# Patient Record
Sex: Male | Born: 1935 | Race: White | Hispanic: No | Marital: Married | Smoking: Never smoker
Health system: Southern US, Community
[De-identification: ages and names within clinical notes are randomized; demographics above are authoritative.]

## PROBLEM LIST (undated history)

## (undated) DIAGNOSIS — I499 Cardiac arrhythmia, unspecified: Secondary | ICD-10-CM

## (undated) DIAGNOSIS — I639 Cerebral infarction, unspecified: Secondary | ICD-10-CM

## (undated) DIAGNOSIS — C4491 Basal cell carcinoma of skin, unspecified: Secondary | ICD-10-CM

## (undated) DIAGNOSIS — N200 Calculus of kidney: Secondary | ICD-10-CM

## (undated) DIAGNOSIS — E785 Hyperlipidemia, unspecified: Secondary | ICD-10-CM

## (undated) DIAGNOSIS — R519 Headache, unspecified: Secondary | ICD-10-CM

## (undated) DIAGNOSIS — N419 Inflammatory disease of prostate, unspecified: Secondary | ICD-10-CM

## (undated) DIAGNOSIS — R51 Headache: Secondary | ICD-10-CM

## (undated) DIAGNOSIS — D649 Anemia, unspecified: Secondary | ICD-10-CM

## (undated) HISTORY — PX: CARDIAC CATHETERIZATION: SHX172

## (undated) HISTORY — PX: APPENDECTOMY: SHX54

## (undated) HISTORY — PX: CATARACT EXTRACTION, BILATERAL: SHX1313

## (undated) HISTORY — PX: TONSILLECTOMY: SUR1361

## (undated) HISTORY — PX: FLEXIBLE SIGMOIDOSCOPY: SHX5431

## (undated) HISTORY — PX: DUPUYTREN CONTRACTURE RELEASE: SHX1478

## (undated) HISTORY — DX: Basal cell carcinoma of skin, unspecified: C44.91

## (undated) HISTORY — PX: COLONOSCOPY: SHX174

---

## 2004-10-09 ENCOUNTER — Encounter: Payer: Self-pay | Admitting: Rheumatology

## 2004-10-25 ENCOUNTER — Encounter: Payer: Self-pay | Admitting: Rheumatology

## 2004-11-25 ENCOUNTER — Encounter: Payer: Self-pay | Admitting: Rheumatology

## 2005-01-22 ENCOUNTER — Ambulatory Visit: Payer: Self-pay | Admitting: Unknown Physician Specialty

## 2005-06-07 ENCOUNTER — Ambulatory Visit: Payer: Self-pay | Admitting: Pain Medicine

## 2005-07-24 ENCOUNTER — Ambulatory Visit: Payer: Self-pay | Admitting: Pain Medicine

## 2005-10-29 ENCOUNTER — Ambulatory Visit: Payer: Self-pay | Admitting: Internal Medicine

## 2005-12-10 ENCOUNTER — Ambulatory Visit: Payer: Self-pay | Admitting: Specialist

## 2005-12-12 ENCOUNTER — Ambulatory Visit: Payer: Self-pay | Admitting: Orthopedic Surgery

## 2006-02-06 ENCOUNTER — Ambulatory Visit: Payer: Self-pay | Admitting: Internal Medicine

## 2006-03-21 ENCOUNTER — Ambulatory Visit: Payer: Self-pay | Admitting: Internal Medicine

## 2007-06-04 ENCOUNTER — Ambulatory Visit: Payer: Self-pay | Admitting: Gastroenterology

## 2007-11-10 ENCOUNTER — Ambulatory Visit: Payer: Self-pay | Admitting: Cardiovascular Disease

## 2007-11-14 ENCOUNTER — Emergency Department: Payer: Self-pay | Admitting: Unknown Physician Specialty

## 2007-11-14 ENCOUNTER — Other Ambulatory Visit: Payer: Self-pay

## 2008-09-08 ENCOUNTER — Ambulatory Visit: Payer: Self-pay | Admitting: Specialist

## 2009-10-27 ENCOUNTER — Ambulatory Visit: Payer: Self-pay | Admitting: Internal Medicine

## 2010-02-16 ENCOUNTER — Ambulatory Visit: Payer: Self-pay | Admitting: Urology

## 2010-03-13 ENCOUNTER — Ambulatory Visit: Payer: Self-pay | Admitting: Urology

## 2011-12-08 ENCOUNTER — Ambulatory Visit: Payer: Self-pay | Admitting: Specialist

## 2011-12-20 ENCOUNTER — Ambulatory Visit: Payer: Self-pay | Admitting: Specialist

## 2014-02-05 DIAGNOSIS — I2510 Atherosclerotic heart disease of native coronary artery without angina pectoris: Secondary | ICD-10-CM | POA: Insufficient documentation

## 2014-09-06 DIAGNOSIS — E782 Mixed hyperlipidemia: Secondary | ICD-10-CM | POA: Diagnosis not present

## 2014-09-06 DIAGNOSIS — I340 Nonrheumatic mitral (valve) insufficiency: Secondary | ICD-10-CM | POA: Insufficient documentation

## 2014-09-06 DIAGNOSIS — I480 Paroxysmal atrial fibrillation: Secondary | ICD-10-CM | POA: Diagnosis not present

## 2014-09-06 DIAGNOSIS — I2510 Atherosclerotic heart disease of native coronary artery without angina pectoris: Secondary | ICD-10-CM | POA: Diagnosis not present

## 2014-09-06 DIAGNOSIS — I10 Essential (primary) hypertension: Secondary | ICD-10-CM | POA: Diagnosis not present

## 2014-09-30 DIAGNOSIS — I4891 Unspecified atrial fibrillation: Secondary | ICD-10-CM | POA: Diagnosis not present

## 2014-10-04 DIAGNOSIS — H35351 Cystoid macular degeneration, right eye: Secondary | ICD-10-CM | POA: Diagnosis not present

## 2014-11-04 DIAGNOSIS — Z125 Encounter for screening for malignant neoplasm of prostate: Secondary | ICD-10-CM | POA: Diagnosis not present

## 2014-11-04 DIAGNOSIS — I4891 Unspecified atrial fibrillation: Secondary | ICD-10-CM | POA: Diagnosis not present

## 2014-11-04 DIAGNOSIS — I38 Endocarditis, valve unspecified: Secondary | ICD-10-CM | POA: Diagnosis not present

## 2014-11-04 DIAGNOSIS — I2510 Atherosclerotic heart disease of native coronary artery without angina pectoris: Secondary | ICD-10-CM | POA: Diagnosis not present

## 2014-11-04 DIAGNOSIS — I10 Essential (primary) hypertension: Secondary | ICD-10-CM | POA: Diagnosis not present

## 2014-11-11 DIAGNOSIS — E780 Pure hypercholesterolemia: Secondary | ICD-10-CM | POA: Diagnosis not present

## 2014-11-11 DIAGNOSIS — I10 Essential (primary) hypertension: Secondary | ICD-10-CM | POA: Diagnosis not present

## 2014-11-11 DIAGNOSIS — I480 Paroxysmal atrial fibrillation: Secondary | ICD-10-CM | POA: Diagnosis not present

## 2014-11-11 DIAGNOSIS — G43009 Migraine without aura, not intractable, without status migrainosus: Secondary | ICD-10-CM | POA: Diagnosis not present

## 2014-11-25 DIAGNOSIS — I4891 Unspecified atrial fibrillation: Secondary | ICD-10-CM | POA: Diagnosis not present

## 2014-12-02 DIAGNOSIS — R351 Nocturia: Secondary | ICD-10-CM | POA: Diagnosis not present

## 2014-12-19 NOTE — Op Note (Signed)
PATIENT NAME:  Ryan Pace, Ryan Pace MR#:  [id number] DATE OF BIRTH:  01-02-1929  DATE OF PROCEDURE:  12/20/2011  PREOPERATIVE DIAGNOSIS: Possible rupture extensor tendons left ring and small fingers.   POSTOPERATIVE DIAGNOSIS: Ulnar dislocation left ring and small finger extensor tendons at the MP joint.   PROCEDURE: 1. Re-centralization left ring and small finger extensor tendons at the MP joint. 2. Exploration of the extensor tendons left wrist. 3. Synovectomy left ring and small finger MP joints.  SURGEON: Park Breed, M.D.   ANESTHESIA: General LMA.   COMPLICATIONS: None.   DRAINS: None.   ESTIMATED BLOOD LOSS: None.   REPLACED: None.   DESCRIPTION OF PROCEDURE: The patient was brought to the operating room where he underwent satisfactory general LMA anesthesia in the supine position. The left arm was prepped and draped in sterile fashion. Esmarch was applied and the tourniquet inflated to 250 mmHg. Tourniquet time was 76 minutes. A longitudinal incision was made over the dorsum of the left wrist and dissection carried out bluntly through subcutaneous tissue. Electrocautery was used to cauterize bridging veins. The extensor tendons were exposed proximal and distal to the dorsal retinaculum. The tendons appeared to be intact with no significant fraying or synovitis there. By applying tension to the tendons over the dorsum of the hand, the tendons were seen to be intact out to the MP region. However, on trying to extend the fingers by pulling on the tendons, the fingers flexed instead. The incision was then made transversely over the dorsum of the ring finger. Dissection was carried out bluntly through subcutaneous tissue. The extensor tendon was found to be completely dislocated ulnarly from its normal dorsal position, and was actually down below the site of rotation. The incision was extended over to the small finger as well and this extensor tendon was also found to be completely  subluxed. Careful dissection was then carried out on both the extensor tendons. A complete release was done ulnarly and the tendons were elevated and brought up dorsally. Lateral bands were cut distally out onto the proximal phalanx as well since the tendons were still subluxed there. The metacarpal heads were examined. They were completely flattened with no groove whatsoever. I used a small burr to create a small groove in both bones, irrigated them thoroughly, and covered them with bone wax. The tendons were then stabilized with 3-0 Mersilene sutures by imbricating the redundant synovium on the radial side of both fingers with multiple sutures. The digiti quinti tendon and the almost ulnar slip of the extensor to the ring finger were divided and they were brought over the top and sutured into the radial side of the extensor hood on both fingers. The tendons were now well centralized. Through flexion and extension they remained in this position. There was no tendency to sublux radially.  After irrigation the wounds were closed with running 5-0 nylon sutures. 0.5% Marcaine was placed in the wounds. Dry sterile compression hand dressing was applied with a volar splint to keep the wrist and fingers in extension. Tourniquet was deflated with good return of blood flow to the hand. The patient was awakened and taken to recovery in good condition.    ____________________________ Park Breed, MD hem:bjt D: 12/20/2011 12:16:08 ET T: 12/20/2011 13:10:57 ET JOB#: [id number]  cc: Park Breed, MD, <Dictator> Park Breed MD ELECTRONICALLY SIGNED 12/22/2011 15:05

## 2014-12-21 DIAGNOSIS — I4891 Unspecified atrial fibrillation: Secondary | ICD-10-CM | POA: Diagnosis not present

## 2015-01-11 DIAGNOSIS — R351 Nocturia: Secondary | ICD-10-CM | POA: Diagnosis not present

## 2015-01-13 DIAGNOSIS — M2041 Other hammer toe(s) (acquired), right foot: Secondary | ICD-10-CM | POA: Diagnosis not present

## 2015-01-13 DIAGNOSIS — M79674 Pain in right toe(s): Secondary | ICD-10-CM | POA: Diagnosis not present

## 2015-01-13 DIAGNOSIS — M79675 Pain in left toe(s): Secondary | ICD-10-CM | POA: Diagnosis not present

## 2015-01-13 DIAGNOSIS — B351 Tinea unguium: Secondary | ICD-10-CM | POA: Diagnosis not present

## 2015-01-19 DIAGNOSIS — R350 Frequency of micturition: Secondary | ICD-10-CM | POA: Diagnosis not present

## 2015-01-19 DIAGNOSIS — N401 Enlarged prostate with lower urinary tract symptoms: Secondary | ICD-10-CM | POA: Diagnosis not present

## 2015-01-19 DIAGNOSIS — R351 Nocturia: Secondary | ICD-10-CM | POA: Diagnosis not present

## 2015-01-31 DIAGNOSIS — N401 Enlarged prostate with lower urinary tract symptoms: Secondary | ICD-10-CM | POA: Diagnosis not present

## 2015-01-31 DIAGNOSIS — R351 Nocturia: Secondary | ICD-10-CM | POA: Diagnosis not present

## 2015-01-31 DIAGNOSIS — R350 Frequency of micturition: Secondary | ICD-10-CM | POA: Diagnosis not present

## 2015-02-07 DIAGNOSIS — N401 Enlarged prostate with lower urinary tract symptoms: Secondary | ICD-10-CM | POA: Diagnosis not present

## 2015-02-07 DIAGNOSIS — R351 Nocturia: Secondary | ICD-10-CM | POA: Diagnosis not present

## 2015-02-08 ENCOUNTER — Encounter
Admission: RE | Admit: 2015-02-08 | Discharge: 2015-02-08 | Disposition: A | Discharge status: Home / Self Care | Payer: Commercial Managed Care - HMO | Source: Ambulatory Visit | Attending: Urology | Admitting: Urology

## 2015-02-08 DIAGNOSIS — N400 Enlarged prostate without lower urinary tract symptoms: Secondary | ICD-10-CM | POA: Insufficient documentation

## 2015-02-08 DIAGNOSIS — Z01812 Encounter for preprocedural laboratory examination: Secondary | ICD-10-CM | POA: Diagnosis not present

## 2015-02-08 HISTORY — DX: Cardiac arrhythmia, unspecified: I49.9

## 2015-02-08 LAB — CBC
HEMATOCRIT: 45.3 % (ref 40.0–52.0)
Hemoglobin: 14.8 g/dL (ref 13.0–18.0)
MCH: 29.9 pg (ref 26.0–34.0)
MCHC: 32.7 g/dL (ref 32.0–36.0)
MCV: 91.5 fL (ref 80.0–100.0)
Platelets: 141 10*3/uL — ABNORMAL LOW (ref 150–440)
RBC: 4.96 MIL/uL (ref 4.40–5.90)
RDW: 13.7 % (ref 11.5–14.5)
WBC: 4.9 10*3/uL (ref 3.8–10.6)

## 2015-02-08 LAB — BASIC METABOLIC PANEL
Anion gap: 7 (ref 5–15)
BUN: 34 mg/dL — ABNORMAL HIGH (ref 6–20)
CO2: 30 mmol/L (ref 22–32)
Calcium: 9.3 mg/dL (ref 8.9–10.3)
Chloride: 105 mmol/L (ref 101–111)
Creatinine, Ser: 1.29 mg/dL — ABNORMAL HIGH (ref 0.61–1.24)
GFR calc Af Amer: 57 mL/min — ABNORMAL LOW (ref >60)
GFR, EST NON AFRICAN AMERICAN: 50 mL/min — AB (ref >60)
GLUCOSE: 87 mg/dL (ref 65–99)
POTASSIUM: 4.2 mmol/L (ref 3.5–5.1)
SODIUM: 142 mmol/L (ref 135–145)

## 2015-02-08 NOTE — H&P (Signed)
NAME:  MAGGIE, DWORKIN NO.:  [id number]  MEDICAL RECORD NO.:  [id number]  LOCATION:                               FACILITY:  [facility]  PHYSICIAN:  Maryan Puls          DATE OF BIRTH:  1929/08/04  DATE OF ADMISSION:  02/15/2015 DATE OF DISCHARGE:                            HISTORY AND PHYSICAL   Same-day surgery scheduled June 21st.  CHIEF COMPLAINT:  Difficulty voiding.  HISTORY OF PRESENT ILLNESS:  Mr. Helt is an 81 year old white male with a one-year history of significant lower urinary tract symptoms, which include frequency, urgency, and nocturia.  Evaluation in the office included a uroflow May 17th, which indicated a maximum flow rate of 4 cc per second, and an average flow rate of 2 cc per second, and a postvoid residual of 48 cc.  Cystoscopy on June 6th indicated an obstructing median lobe.  Patient comes in now for photovaporization of the prostate with the GreenLight laser.  ALLERGIES:  PATIENT IS ALLERGIC TO PENICILLIN.  CURRENT MEDICATIONS:  Include Coumadin, verapamil, and pravastatin.  PAST SURGICAL HISTORY:  Prior surgical procedures included: 1. Appendectomy in 1948. 2. Bilateral Dupuytren contracture of the hands in 2011. 3. Cardiac catheterization 2014, which did not reveal any significant     blockages. 4. Photovaporization of prostate 2011.  SOCIAL HISTORY:  The patient denied tobacco or alcohol use.  FAMILY HISTORY:  Positive for colon cancer.  PAST AND CURRENT MEDICAL CONDITIONS: 1. Atrial fibrillation since 1995. 2. Hypercholesterolemia.  REVIEW OF SYSTEMS:  Patient denied chest pain, shortness of breath, diabetes, hypertension, or stroke.  PHYSICAL EXAMINATION:  GENERAL:  Well-nourished white male in no acute distress. HEENT:  Sclerae were clear.  Pupils were equally round and reactive to light and accommodation.  Extraocular movements were intact. NECK:  Supple.  No palpable cervical adenopathy.  No audible  carotid bruits. LUNGS:  Clear to auscultation. CARDIOVASCULAR:  Regular rhythm and rate without audible murmurs. ABDOMEN:  Soft, nontender abdomen. GU:  Uncircumcised.  Testes smooth, nontender. RECTAL:  35 g, smooth, nontender prostate. NEUROMUSCULAR:  Alert and oriented x3.  IMPRESSION:  Benign prostatic hyperplasia with bladder outlet obstruction.  PLAN:  Photovaporization of prostate with the GreenLight Laser Therapy laser.          ______________________________ Maryan Puls     MW/MEDQ  D:  02/07/2015  T:  02/07/2015  Job:  [id number]

## 2015-02-08 NOTE — Patient Instructions (Signed)
  Your procedure is scheduled on: Tuesday 6/21 Report to Day Surgery. [facility] Entrance To find out your arrival time please call [phone] between 1PM - 3PM on Monday 6/20.  Remember: Instructions that are not followed completely may result in serious medical risk, up to and including death, or upon the discretion of your surgeon and anesthesiologist your surgery may need to be rescheduled.    _x___ 1. Do not eat food or drink liquids after midnight. No gum chewing or hard candies.     __x__ 2. No Alcohol for 24 hours before or after surgery.   ____ 3. Bring all medications with you on the day of surgery if instructed.    __x__ 4. Notify your doctor if there is any change in your medical condition     (cold, fever, infections).     Do not wear jewelry, make-up, hairpins, clips or nail polish.  Do not wear lotions, powders, or perfumes. You may wear deodorant.  Do not shave 48 hours prior to surgery. Men may shave face and neck.  Do not bring valuables to the hospital.    [facility] is not responsible for any belongings or valuables.               Contacts, dentures or bridgework may not be worn into surgery.  Leave your suitcase in the car. After surgery it may be brought to your room.  For patients admitted to the hospital, discharge time is determined by your                treatment team.   Patients discharged the day of surgery will not be allowed to drive home.   Please read over the following fact sheets that you were given:   Surgical Site Infection Prevention   __x__ Take these medicines the morning of surgery with A SIP OF WATER:    1. verapamil  2.   3.   4.  5.  6.  ____ Fleet Enema (as directed)   ____ Use CHG Soap as directed  ____ Use inhalers on the day of surgery  ____ Stop metformin 2 days prior to surgery    ____ Take 1/2 of usual insulin dose the night before surgery and none on the morning of surgery.   __x__ Stop Coumadin/Plavix/aspirin  on  7 days prior to surgery as directed by Dr. Yves Dill  ____ Stop Anti-inflammatories on    ____ Stop supplements until after surgery.    ____ Bring C-Pap to the hospital.

## 2015-02-15 ENCOUNTER — Ambulatory Visit
Admission: RE | Admit: 2015-02-15 | Discharge: 2015-02-15 | Disposition: A | Discharge status: Home / Self Care | Payer: Commercial Managed Care - HMO | Source: Ambulatory Visit | Attending: Urology | Admitting: Urology

## 2015-02-15 ENCOUNTER — Ambulatory Visit: Payer: Commercial Managed Care - HMO | Admitting: Anesthesiology

## 2015-02-15 ENCOUNTER — Encounter: Payer: Self-pay | Admitting: Anesthesiology

## 2015-02-15 ENCOUNTER — Encounter
Admission: RE | Disposition: A | Discharge status: Home / Self Care | Payer: Self-pay | Source: Ambulatory Visit | Attending: Urology

## 2015-02-15 DIAGNOSIS — Z9889 Other specified postprocedural states: Secondary | ICD-10-CM | POA: Insufficient documentation

## 2015-02-15 DIAGNOSIS — I4891 Unspecified atrial fibrillation: Secondary | ICD-10-CM | POA: Diagnosis not present

## 2015-02-15 DIAGNOSIS — Z880 Allergy status to penicillin: Secondary | ICD-10-CM | POA: Diagnosis not present

## 2015-02-15 DIAGNOSIS — N401 Enlarged prostate with lower urinary tract symptoms: Secondary | ICD-10-CM | POA: Insufficient documentation

## 2015-02-15 DIAGNOSIS — N138 Other obstructive and reflux uropathy: Secondary | ICD-10-CM | POA: Insufficient documentation

## 2015-02-15 DIAGNOSIS — N400 Enlarged prostate without lower urinary tract symptoms: Secondary | ICD-10-CM | POA: Diagnosis not present

## 2015-02-15 DIAGNOSIS — E780 Pure hypercholesterolemia: Secondary | ICD-10-CM | POA: Diagnosis not present

## 2015-02-15 HISTORY — PX: GREEN LIGHT LASER TURP (TRANSURETHRAL RESECTION OF PROSTATE: SHX6260

## 2015-02-15 LAB — PROTIME-INR
INR: 1.08
PROTHROMBIN TIME: 14.2 s (ref 11.4–15.0)

## 2015-02-15 SURGERY — GREEN LIGHT LASER TURP (TRANSURETHRAL RESECTION OF PROSTATE
Anesthesia: General | Wound class: Clean Contaminated

## 2015-02-15 MED ORDER — LEVOFLOXACIN IN D5W 500 MG/100ML IV SOLN
INTRAVENOUS | Status: AC
  Administered 2015-02-15: 500 mg via INTRAVENOUS
  Filled 2015-02-15: qty 100

## 2015-02-15 MED ORDER — DOCUSATE SODIUM 100 MG PO CAPS: 200.0000 mg | ORAL_CAPSULE | Freq: Two times a day (BID) | ORAL | Status: DC

## 2015-02-15 MED ORDER — LACTATED RINGERS IV SOLN
INTRAVENOUS | Status: DC
  Administered 2015-02-15: 14:00:00 via INTRAVENOUS

## 2015-02-15 MED ORDER — BELLADONNA ALKALOIDS-OPIUM 16.2-60 MG RE SUPP
RECTAL | Status: AC
  Filled 2015-02-15: qty 1

## 2015-02-15 MED ORDER — DEXAMETHASONE SODIUM PHOSPHATE 4 MG/ML IJ SOLN
INTRAMUSCULAR | Status: DC | PRN
  Administered 2015-02-15: 5 mg via INTRAVENOUS

## 2015-02-15 MED ORDER — BELLADONNA ALKALOIDS-OPIUM 16.2-60 MG RE SUPP
RECTAL | Status: DC | PRN
  Administered 2015-02-15: 1 via RECTAL

## 2015-02-15 MED ORDER — FENTANYL CITRATE (PF) 100 MCG/2ML IJ SOLN
INTRAMUSCULAR | Status: AC
  Filled 2015-02-15: qty 2

## 2015-02-15 MED ORDER — PROPOFOL 10 MG/ML IV BOLUS
INTRAVENOUS | Status: DC | PRN
  Administered 2015-02-15: 130 mg via INTRAVENOUS

## 2015-02-15 MED ORDER — NUCYNTA 50 MG PO TABS: 50.0000 mg | ORAL_TABLET | Freq: Four times a day (QID) | ORAL | Status: DC | PRN

## 2015-02-15 MED ORDER — ONDANSETRON HCL 4 MG/2ML IJ SOLN
INTRAMUSCULAR | Status: DC | PRN
  Administered 2015-02-15: 4 mg via INTRAVENOUS

## 2015-02-15 MED ORDER — UROGESIC-BLUE 81.6 MG PO TABS: 1.0000 | ORAL_TABLET | Freq: Four times a day (QID) | ORAL | Status: DC | PRN

## 2015-02-15 MED ORDER — LIDOCAINE HCL 2 % EX GEL
CUTANEOUS | Status: AC
  Filled 2015-02-15: qty 10

## 2015-02-15 MED ORDER — FAMOTIDINE 20 MG PO TABS
ORAL_TABLET | ORAL | Status: AC
  Administered 2015-02-15: 20 mg via ORAL
  Filled 2015-02-15: qty 1

## 2015-02-15 MED ORDER — ONDANSETRON HCL 4 MG/2ML IJ SOLN: 4.0000 mg | Freq: Once | INTRAMUSCULAR | Status: DC | PRN

## 2015-02-15 MED ORDER — FENTANYL CITRATE (PF) 100 MCG/2ML IJ SOLN
INTRAMUSCULAR | Status: DC | PRN
  Administered 2015-02-15 (×2): 50 ug via INTRAVENOUS

## 2015-02-15 MED ORDER — LIDOCAINE HCL 2 % EX GEL
CUTANEOUS | Status: AC
  Filled 2015-02-15: qty 5

## 2015-02-15 MED ORDER — SODIUM CHLORIDE 0.9 % IR SOLN
Status: DC | PRN
  Administered 2015-02-15: 4600 mL

## 2015-02-15 MED ORDER — LEVOFLOXACIN IN D5W 500 MG/100ML IV SOLN
500.0000 mg | INTRAVENOUS | Status: DC
  Administered 2015-02-15: 500 mg via INTRAVENOUS

## 2015-02-15 MED ORDER — FAMOTIDINE 20 MG PO TABS
20.0000 mg | ORAL_TABLET | Freq: Once | ORAL | Status: AC
  Administered 2015-02-15: 20 mg via ORAL

## 2015-02-15 MED ORDER — FENTANYL CITRATE (PF) 100 MCG/2ML IJ SOLN
25.0000 ug | INTRAMUSCULAR | Status: DC | PRN
  Administered 2015-02-15 (×3): 25 ug via INTRAVENOUS

## 2015-02-15 MED ORDER — LEVOFLOXACIN 500 MG PO TABS: 500.0000 mg | ORAL_TABLET | Freq: Every day | ORAL | Status: DC

## 2015-02-15 MED ORDER — LIDOCAINE HCL 2 % EX GEL
CUTANEOUS | Status: DC | PRN
  Administered 2015-02-15: 1

## 2015-02-15 MED ORDER — LIDOCAINE HCL (CARDIAC) 20 MG/ML IV SOLN
INTRAVENOUS | Status: DC | PRN
Start: 2015-02-15 — End: 2015-02-15
  Administered 2015-02-15: 80 mg via INTRAVENOUS

## 2015-02-15 SURGICAL SUPPLY — 26 items
ADAPTER IRRIG TUBE 2 SPIKE SOL (ADAPTER) ×4 IMPLANT
BAG URO DRAIN 2000ML W/SPOUT (MISCELLANEOUS) ×2 IMPLANT
CATH FOLEY 2WAY  5CC 20FR SIL (CATHETERS) ×1
CATH FOLEY 2WAY 5CC 20FR SIL (CATHETERS) ×1 IMPLANT
GLOVE BIO SURGEON STRL SZ7 (GLOVE) ×4 IMPLANT
GLOVE BIO SURGEON STRL SZ7.5 (GLOVE) ×2 IMPLANT
GOWN STRL REUS W/ TWL LRG LVL3 (GOWN DISPOSABLE) ×3 IMPLANT
GOWN STRL REUS W/ TWL XL LVL3 (GOWN DISPOSABLE) ×1 IMPLANT
GOWN STRL REUS W/TWL LRG LVL3 (GOWN DISPOSABLE) ×3
GOWN STRL REUS W/TWL XL LVL3 (GOWN DISPOSABLE) ×1
IV NS 1000ML (IV SOLUTION) ×4
IV NS 1000ML BAXH (IV SOLUTION) ×4 IMPLANT
IV SET PRIMARY 15D 139IN B9900 (IV SETS) ×2 IMPLANT
JELLY LUB 2OZ STRL (MISCELLANEOUS) ×1
JELLY LUBE 2OZ STRL (MISCELLANEOUS) ×1 IMPLANT
KIT RM TURNOVER CYSTO AR (KITS) ×2 IMPLANT
LASER GRNLGT 950 (MISCELLANEOUS) ×2 IMPLANT
LASER GRNLGT MOXY FIBER 750UM (MISCELLANEOUS) ×2 IMPLANT
PACK CYSTO AR (MISCELLANEOUS) ×2 IMPLANT
PREP PVP WINGED SPONGE (MISCELLANEOUS) ×2 IMPLANT
SET IRRIG Y TYPE TUR BLADDER L (SET/KITS/TRAYS/PACK) ×2 IMPLANT
SOL PREP PVP 2OZ (MISCELLANEOUS) ×2
SOLUTION PREP PVP 2OZ (MISCELLANEOUS) ×1 IMPLANT
SYRINGE IRR TOOMEY STRL 70CC (SYRINGE) ×2 IMPLANT
TUBING CONNECTING 10 (TUBING) ×2 IMPLANT
WATER STERILE IRR 1000ML POUR (IV SOLUTION) ×2 IMPLANT

## 2015-02-15 NOTE — Op Note (Addendum)
Preoperative diagnosis:  1. Bladder outlet obstruction secondary to enlarged prostate (BPH)  Postoperative diagnosis:  1. As above   Procedure:  Photovaporization of the prostate with greenlight laser  Surgeon: Otelia Limes. Yves Dill MD, FACS   Anesthesia: General   Complications: None  Intraoperative findings: Trilobar BPH  Joules:  80   EBL: Minimal  Specimens: None  Indication: Ryan Pace is a 81 year old patient with BPH and lower urinary tract symptoms..  After reviewing the management options for treatment, he elected to proceed with the above surgical procedure(s). We have discussed the potential benefits and risks of the procedure, side effects of the proposed treatment, the likelihood of the patient achieving the goals of the procedure, and any potential problems that might occur during the procedure or recuperation. Informed consent has been obtained.  Description of procedure: The patient was taken to the operating room and general anesthesia was induced.  The patient was placed in the dorsal lithotomy position, prepped and draped in the usual sterile fashion, and preoperative antibiotics were administered. A preoperative time-out was performed.   The patient was taken to the operating room and general anesthesia was induced. The patient was placed in the dorsal lithotomy position, prepped and draped in the usual sterile fashion, and preoperative antibiotics were administered. A preoperative time-out was performed.   The laser scope was coupled to the camera and visually advanced into the bladder. Bladder was thoroughly inspected and no lesions or tumors identified. Both ureteral orifices were identified and had clear efflux. The patient had trlobar BPH. The XPS greenlight laser fiber was passed through the scope. The power was set at 80 watts and the median lobe and bladder neck tissue was vaporized. Remaining obstructive tissue from the bladder neck to the verumontanum was  vaporized at 80 watts.  Once an adequate channel had been created, all  bleeders were cauterized. Urojet lidocaine was injected into the urethra and bladder. A 56C silicone Foley catheter was placed into the patient's urethra and the bladder irrigated until the effluent was clear. A B&O suppository was placed. The patient was subsequently extubated and returned to the PACU in excellent condition. There no immediate complications.  Disposition:  The patient will be discharged home today with Foley catheter placed, and return in 1-2 days for a voiding trial.

## 2015-02-15 NOTE — Discharge Instructions (Signed)
Benign Prostatic Hyperplasia An enlarged prostate (benign prostatic hyperplasia) is common in older men. You may experience the following:  Weak urine stream.  Dribbling.  Feeling like the bladder has not emptied completely.  Difficulty starting urination.  Getting up frequently at night to urinate.  Urinating more frequently during the day. HOME CARE INSTRUCTIONS  Monitor your prostatic hyperplasia for any changes. The following actions may help to alleviate any discomfort you are experiencing:  Give yourself time when you urinate.  Stay away from alcohol.  Avoid beverages containing caffeine, such as coffee, tea, and colas, because they can make the problem worse.  Avoid decongestants, antihistamines, and some prescription medicines that can make the problem worse.  Follow up with your health care provider for further treatment as recommended. SEEK MEDICAL CARE IF:  You are experiencing progressive difficulty voiding.  Your urine stream is progressively getting narrower.  You are awaking from sleep with the urge to void more frequently.  You are constantly feeling the need to void.  You experience loss of urine, especially in small amounts. SEEK IMMEDIATE MEDICAL CARE IF:   You develop increased pain with urination or are unable to urinate.  You develop severe abdominal pain, vomiting, a high fever, or fainting.  You develop back pain or blood in your urine. MAKE SURE YOU:   Understand these instructions.  Will watch your condition.  Will get help right away if you are not doing well or get worse. Document Released: 08/13/2005 Document Revised: 04/15/2013 Document Reviewed: 01/13/2013 [facility] Patient Information 2015 [place], [place]. This information is not intended to replace advice given to you by your health care provider. Make sure you discuss any questions you have with your health care provider.  Julianne Rice Laser Prostate Treatment Green light laser  therapy is a procedure that uses a special high-energy laser for vaporizing extra prostate tissue. It is less invasive than traditional methods of prostate surgery, which involve cutting out the prostate tissue. Because the tissue is vaporized rather than cut out there is generally less blood loss. LET [facility] CARE PROVIDER KNOW ABOUT:  Any allergies you have.  Any medicines you are taking, including vitamins, herbs, eye drops, creams, and over-the-counter medication.  Previous problems you or members of your family have had with the use of anesthetics.  Any blood disorders you have.  Previous surgeries you have had.  Medical conditions you have. RISKS AND COMPLICATIONS Generally, green light laser prostate treatment is a safe procedure. However, as with any procedure, complications can occur. Possible complications include:  Urinary tract infection.  Erectile dysfunction (rare).  Dry ejaculation--Semen is not released when you reach sexual climax.  Scar tissue in the urinary passage. BEFORE THE PROCEDURE   Your health care provider may discuss medicines you are taking and may advise you to stop taking specific ones.  You may be given antibiotic medicine to take as a precaution against bacterial infection.  Do not eat or drink anything for 8 hours before your procedure or as directed by your health care provider. You may have a sip of water to take any necessary medicines. PROCEDURE Depending on the size and shape of your prostate, the procedure may take 30-60 minutes. You will be given one of the following:   A medicine that makes you go to sleep (general anesthetic).  A medicine injected into your spine that numbs your body below the waist (spinal anesthetic). Sedation is usually given with spinal anesthetic so you will be relaxed. A  tube containing viewing scopes and instruments will be inserted through your penis so that no cuts (incisions) are needed. A thin fiber is put  through the tube and positioned next to the excess prostate tissue. Pulses of laser light come from the end of the fiber and are projected onto the excess tissue. The laser beam is absorbed by your blood, which becomes hot enough to vaporize the excess prostate tissue. This laser beam will seal off the blood vessels, decreasing bleeding. The tube with the viewing scopes, instruments, and thin fiber will be removed and replaced with a temporary catheter. AFTER THE PROCEDURE  After the surgery, you will be sent to the recovery room for a short time. Depending on factors such as the amount of prostate tissue vaporized, the strength of your bladder, and the amount of bleeding expected, the catheter may be removed. Generally, overnight stay is not needed and you will be sent home on the same day as the procedure. You may be sent home with elastic support stockings to help prevent blood clots in your legs.  Document Released: 11/20/2007 Document Revised: 08/18/2013 Document Reviewed: 02/02/2013 [facility] Patient Information 2015 [place], [place]. This information is not intended to replace advice given to you by your health care provider. Make sure you discuss any questions you have with your health care provider.  Benign Prostatic Hypertrophy The prostate gland is part of the reproductive system of men. A normal prostate is about the size and shape of a walnut. The prostate gland produces a fluid that is mixed with sperm to make semen. This gland surrounds the urethra and is located in front of the rectum and just below the bladder. The bladder is where urine is stored. The urethra is the tube through which urine passes from the bladder to get out of the body. The prostate grows as a man ages. An enlarged prostate not caused by cancer is called benign prostatic hypertrophy (BPH). An enlarged prostate can press on the urethra. This can make it harder to pass urine. In the early stages of enlargement, the bladder can  get by with a narrowed urethra by forcing the urine through. If the problem gets worse, medical or surgical treatment may be required.  This condition should be followed by your health care provider. The accumulation of urine in the bladder can cause infection. Back pressure and infection can progress to bladder damage and kidney (renal) failure. If needed, your health care provider may refer you to a specialist in kidney and prostate disease (urologist). CAUSES  BPH is a common health problem in men older than 50 years. This condition is a normal part of aging. However, not all men will develop problems from this condition. If the enlargement grows away from the urethra, then there will not be any compression of the urethra and resistance to urine flow.If the growth is toward the urethra and compresses it, you will experience difficulty urinating.  SYMPTOMS   Not able to completely empty your bladder.  Getting up often during the night to urinate.  Need to urinate frequently during the day.  Difficultly starting urine flow.  Decrease in size and strength of your urine stream.  Dribbling after urination.  Pain on urination (more common with infection).  Inability to pass urine. This needs immediate treatment.  The development of a urinary tract infection. DIAGNOSIS  These tests will help your health care provider understand your problem:  A thorough history and physical examination.  A urination history, with  the number of times you urinate, the amounts of urine, the strength of the urine stream, and the feeling of emptiness or fullness after urinating.  A postvoid bladder scan that measures any amount of urine that may remain in your bladder after you finish urinating.  Digital rectal exam. In a rectal exam, your health care provider checks your prostate by putting a gloved, lubricated finger into your rectum to feel the back of your prostate gland. This exam detects the size of  your gland and abnormal lumps or growths.  Exam of your urine (urinalysis).  Prostate specific antigen (PSA) screening. This is a blood test used to screen for prostate cancer.  Rectal ultrasonography. This test uses sound waves to electronically produce a picture of your prostate gland. TREATMENT  Once symptoms begin, your health care provider will monitor your condition. Of the men with this condition, one third will have symptoms that stabilize, one third will have symptoms that improve, and one third will have symptoms that progress in the first year. Mild symptoms may not need treatment. Simple observation and yearly exams may be all that is required. Medicines and surgery are options for more severe problems. Your health care provider can help you make an informed decision for what is best. Two classes of medicines are available for relief of prostate symptoms:  Medicines that shrink the prostate. This helps relieve symptoms. These medicines take time to work, and it may be months before any improvement is seen.  Uncommon side effects include problems with sexual function.  Medicines to relax the muscle of the prostate. This also relieves the obstruction by reducing any compression on the urethra.This group of medicines work much faster than those that reduce the size of the prostate gland. Usually, one can experience improvement in days to weeks..  Side effects can include dizziness, fatigue, lightheadedness, and retrograde ejaculation (diminished volume of ejaculate). Several types of surgical treatments are available for relief of prostate symptoms:  Transurethral resection of the prostate (TURP)--In this treatment, an instrument is inserted through opening at the tip of the penis. It is used to cut away pieces of the inner core of the prostate. The pieces are removed through the same opening of the penis. This removes the obstruction and helps get rid of the symptoms.  Transurethral  incision (TUIP)--In this procedure, small cuts are made in the prostate. This lessens the prostates pressure on the urethra.  Transurethral microwave thermotherapy (TUMT)--This procedure uses microwaves to create heat. The heat destroys and removes a small amount of prostate tissue.  Transurethral needle ablation (TUNA)--This is a procedure that uses radio frequencies to do the same as TUMT.  Interstitial laser coagulation (ILC)--This is a procedure that uses a laser to do the same as TUMT and TUNA.  Transurethral electrovaporization (TUVP)--This is a procedure that uses electrodes to do the same as the procedures listed above. SEEK MEDICAL CARE IF:   You develop a fever.  There is unexplained back pain.  Symptoms are not helped by medicines prescribed.  You develop side effects from the medicine you are taking.  Your urine becomes very dark or has a bad smell.  Your lower abdomen becomes distended and you have difficulty passing your urine. SEEK IMMEDIATE MEDICAL CARE IF:   You are suddenly unable to urinate. This is an emergency. You should be seen immediately.  There are large amounts of blood or clots in the urine.  Your urinary problems become unmanageable.  You develop lightheadedness, severe dizziness,  or you feel faint.  You develop moderate to severe low back or flank pain.  You develop chills or fever. Document Released: 08/13/2005 Document Revised: 08/18/2013 Document Reviewed: 02/26/2013 [facility] Patient Information 2015 [place], [place]. This information is not intended to replace advice given to you by your health care provider. Make sure you discuss any questions you have with your health care provider.  Prostate Laser Surgery Prostate laser surgery is a procedure to eliminate prostate tissue. There are two types of prostate laser surgery: ablation (prostate tissue is melted away) and enucleation (prostate tissue is cut out). LET [facility] CARE PROVIDER KNOW  ABOUT:  Any allergies you have.  All medicines you are taking, including vitamins, herbs, eye drops, creams, and over-the-counter medicines.  Previous problems you or members of your family have had with the use of anesthetics.  Any blood disorders you have.  Previous surgeries you have had.  Medical conditions you have. RISKS AND COMPLICATIONS  Generally prostate laser surgery is a safe procedure. However, as with any procedure, problems can occur. Possible problems include:  Bleeding and the need for a blood transfusion.   Urinary tract infection.  Erectile dysfunction.  Narrowing (scar or stricture) of the urethra, which blocks the flow of urine.  Dry ejaculation (semen is not released when you reach sexual climax). BEFORE THE PROCEDURE   If you are on blood thinners, such as warfarin or clopidogrel, or nonprescription pain-relieving medicines, such as naproxen sodium or ibuprofen, you may be asked to stop taking them before the procedure.  Your health care provider may ask you to start taking antibiotic medicines before the procedure as a precaution against a bacterial infection. The procedure will not be performed if your urine is infected.  You should have nothing to eat or drink for at least 8 hours before your procedure, or as suggested by your health care provider. You may have a sip of water to take medications not stopped for the procedure. PROCEDURE  You will be given one of the following:   A medicine that numbs the area (local anesthetic).  A medicine injected into your spine that numbs your body below the waist (spinal anesthetic). A sedative is usually given with spinal anesthetic so you will be relaxed during the procedure. A viewing scope and instruments will be placed in a tube that is inserted through your penis, so no incisions will be needed to insert the scope and instruments. Depending on the type of laser used, the prostate tissue will either be  vaporized or cut away. The laser beam will coagulate any small bleeding areas. At the end of the surgery, a special tube will be inserted into your bladder to drain the urine from your bladder (urinary catheter). AFTER THE PROCEDURE You will be sent to the recovery room for a short time. In the recovery room, you will receive fluids through an IV tube inserted in one of your veins. Your blood pressure and pulse will be checked frequently to make sure that they stabilize. Once you are eating and drinking fluids appropriately, the IV tube will be removed.  Depending on your specific needs, you may be admitted to the hospital or you will be sent home after the procedure. If you are sent home:  You may be sent home with elastic support stockings to help prevent blood clots in your legs.  You will also probably be given an antibiotic medicine.  Unless told otherwise, you may restart your other medications.  You may  be given a stool softener. Document Released: 08/13/2005 Document Revised: 08/18/2013 Document Reviewed: 02/02/2013 [facility] Patient Information 2015 [place], [place]. This information is not intended to replace advice given to you by your health care provider. Make sure you discuss any questions you have with your health care provider. AMBULATORY SURGERY  DISCHARGE INSTRUCTIONS   1) The drugs that you were given will stay in your system until tomorrow so for the next 24 hours you should not:  A) Drive an automobile B) Make any legal decisions C) Drink any alcoholic beverage   2) You may resume regular meals tomorrow.  Today it is better to start with liquids and gradually work up to solid foods.  You may eat anything you prefer, but it is better to start with liquids, then soup and crackers, and gradually work up to solid foods.   3) Please notify your doctor immediately if you have any unusual bleeding, trouble breathing, redness and pain at the surgery site, drainage, fever, or  pain not relieved by medication. 4)   5) Your post-operative visit with Dr.    George Ina                                     02/16/2015 0830 Please call to schedule your post-operative visit.  6) Additional Instructions: 7)

## 2015-02-15 NOTE — Anesthesia Preprocedure Evaluation (Signed)
Anesthesia Evaluation  Patient identified by MRN, date of birth, ID band Patient awake    Reviewed: Allergy & Precautions, NPO status , Patient's Chart, lab work & pertinent test results  Airway Mallampati: II  TM Distance: >3 FB     Dental  (+) Partial Upper, Chipped   Pulmonary          Cardiovascular + dysrhythmias Atrial Fibrillation     Neuro/Psych    GI/Hepatic   Endo/Other    Renal/GU      Musculoskeletal   Abdominal   Peds  Hematology   Anesthesia Other Findings   Reproductive/Obstetrics                             Anesthesia Physical Anesthesia Plan  ASA: III  Anesthesia Plan: General   Post-op Pain Management:    Induction: Intravenous  Airway Management Planned: LMA  Additional Equipment:   Intra-op Plan:   Post-operative Plan:   Informed Consent: I have reviewed the patients History and Physical, chart, labs and discussed the procedure including the risks, benefits and alternatives for the proposed anesthesia with the patient or authorized representative who has indicated his/her understanding and acceptance.     Plan Discussed with: CRNA  Anesthesia Plan Comments:         Anesthesia Quick Evaluation

## 2015-02-15 NOTE — Anesthesia Procedure Notes (Signed)
Procedure Name: LMA Insertion Date/Time: 02/15/2015 3:33 PM Performed by: Aline Brochure Pre-anesthesia Checklist: Patient identified, Emergency Drugs available, Suction available and Patient being monitored Patient Re-evaluated:Patient Re-evaluated prior to inductionOxygen Delivery Method: Circle system utilized Preoxygenation: Pre-oxygenation with 100% oxygen Intubation Type: IV induction Ventilation: Mask ventilation without difficulty LMA: LMA inserted LMA Size: 4.5 Number of attempts: 1 Airway Equipment and Method: Patient positioned with wedge pillow Placement Confirmation: positive ETCO2 and breath sounds checked- equal and bilateral Tube secured with: Tape Dental Injury: Teeth and Oropharynx as per pre-operative assessment

## 2015-02-15 NOTE — Anesthesia Postprocedure Evaluation (Signed)
  Anesthesia Post-op Note  Patient: Ryan Pace  Procedure(s) Performed: Procedure(s): GREEN LIGHT LASER TURP (TRANSURETHRAL RESECTION OF PROSTATE (N/A)  Anesthesia type:General  Patient location: PACU  Post pain: Pain level controlled  Post assessment: Post-op Vital signs reviewed, Patient's Cardiovascular Status Stable, Respiratory Function Stable, Patent Airway and No signs of Nausea or vomiting  Post vital signs: Reviewed and stable  Last Vitals:  Filed Vitals:   02/15/15 1719  BP: 147/78  Pulse: 60  Temp:   Resp: 16    Level of consciousness: awake, alert  and patient cooperative  Complications: No apparent anesthesia complications

## 2015-02-15 NOTE — Transfer of Care (Signed)
Immediate Anesthesia Transfer of Care Note  Patient: Ryan Pace  Procedure(s) Performed: Procedure(s): GREEN LIGHT LASER TURP (TRANSURETHRAL RESECTION OF PROSTATE (N/A)  Patient Location: PACU  Anesthesia Type:General  Level of Consciousness: awake  Airway & Oxygen Therapy: Patient Spontanous Breathing and Patient connected to face mask oxygen  Post-op Assessment: Report given to RN and Post -op Vital signs reviewed and stable  Post vital signs: stable  Last Vitals:  Filed Vitals:   02/15/15 1617  BP: 158/97  Pulse: 79  Temp: 36.1 C  Resp: 11    Complications: No apparent anesthesia complications

## 2015-02-15 NOTE — Progress Notes (Signed)
PT drawn by lab tech.

## 2015-02-15 NOTE — Pre-Procedure Instructions (Signed)
Date of Initial H&P: 02/07/15  History reviewed, patient examined, no change in status, stable for surgery.

## 2015-02-16 ENCOUNTER — Encounter: Payer: Self-pay | Admitting: Urology

## 2015-02-16 DIAGNOSIS — N401 Enlarged prostate with lower urinary tract symptoms: Secondary | ICD-10-CM | POA: Diagnosis not present

## 2015-03-01 DIAGNOSIS — I480 Paroxysmal atrial fibrillation: Secondary | ICD-10-CM | POA: Diagnosis not present

## 2015-03-02 DIAGNOSIS — N401 Enlarged prostate with lower urinary tract symptoms: Secondary | ICD-10-CM | POA: Diagnosis not present

## 2015-03-02 DIAGNOSIS — R351 Nocturia: Secondary | ICD-10-CM | POA: Diagnosis not present

## 2015-03-09 DIAGNOSIS — E782 Mixed hyperlipidemia: Secondary | ICD-10-CM | POA: Insufficient documentation

## 2015-03-10 DIAGNOSIS — I10 Essential (primary) hypertension: Secondary | ICD-10-CM | POA: Diagnosis not present

## 2015-03-10 DIAGNOSIS — I872 Venous insufficiency (chronic) (peripheral): Secondary | ICD-10-CM | POA: Diagnosis not present

## 2015-03-10 DIAGNOSIS — I480 Paroxysmal atrial fibrillation: Secondary | ICD-10-CM | POA: Diagnosis not present

## 2015-03-10 DIAGNOSIS — I340 Nonrheumatic mitral (valve) insufficiency: Secondary | ICD-10-CM | POA: Diagnosis not present

## 2015-05-09 DIAGNOSIS — I10 Essential (primary) hypertension: Secondary | ICD-10-CM | POA: Diagnosis not present

## 2015-05-09 DIAGNOSIS — N401 Enlarged prostate with lower urinary tract symptoms: Secondary | ICD-10-CM | POA: Diagnosis not present

## 2015-05-09 DIAGNOSIS — I2510 Atherosclerotic heart disease of native coronary artery without angina pectoris: Secondary | ICD-10-CM | POA: Diagnosis not present

## 2015-05-09 DIAGNOSIS — R351 Nocturia: Secondary | ICD-10-CM | POA: Diagnosis not present

## 2015-05-09 DIAGNOSIS — I480 Paroxysmal atrial fibrillation: Secondary | ICD-10-CM | POA: Diagnosis not present

## 2015-05-16 DIAGNOSIS — I480 Paroxysmal atrial fibrillation: Secondary | ICD-10-CM | POA: Diagnosis not present

## 2015-05-16 DIAGNOSIS — I2510 Atherosclerotic heart disease of native coronary artery without angina pectoris: Secondary | ICD-10-CM | POA: Diagnosis not present

## 2015-05-16 DIAGNOSIS — I10 Essential (primary) hypertension: Secondary | ICD-10-CM | POA: Diagnosis not present

## 2015-05-16 DIAGNOSIS — E782 Mixed hyperlipidemia: Secondary | ICD-10-CM | POA: Diagnosis not present

## 2015-05-16 DIAGNOSIS — G4700 Insomnia, unspecified: Secondary | ICD-10-CM | POA: Diagnosis not present

## 2015-05-16 DIAGNOSIS — G43009 Migraine without aura, not intractable, without status migrainosus: Secondary | ICD-10-CM | POA: Diagnosis not present

## 2015-05-20 DIAGNOSIS — I2510 Atherosclerotic heart disease of native coronary artery without angina pectoris: Secondary | ICD-10-CM | POA: Diagnosis not present

## 2015-05-20 DIAGNOSIS — I10 Essential (primary) hypertension: Secondary | ICD-10-CM | POA: Diagnosis not present

## 2015-05-20 DIAGNOSIS — I480 Paroxysmal atrial fibrillation: Secondary | ICD-10-CM | POA: Diagnosis not present

## 2015-07-11 DIAGNOSIS — H35373 Puckering of macula, bilateral: Secondary | ICD-10-CM | POA: Diagnosis not present

## 2015-07-11 DIAGNOSIS — I482 Chronic atrial fibrillation: Secondary | ICD-10-CM | POA: Diagnosis not present

## 2015-08-09 DIAGNOSIS — I482 Chronic atrial fibrillation: Secondary | ICD-10-CM | POA: Diagnosis not present

## 2015-09-12 DIAGNOSIS — I10 Essential (primary) hypertension: Secondary | ICD-10-CM | POA: Diagnosis not present

## 2015-10-11 DIAGNOSIS — I10 Essential (primary) hypertension: Secondary | ICD-10-CM | POA: Diagnosis not present

## 2015-10-11 DIAGNOSIS — I482 Chronic atrial fibrillation: Secondary | ICD-10-CM | POA: Diagnosis not present

## 2015-10-12 DIAGNOSIS — G4700 Insomnia, unspecified: Secondary | ICD-10-CM | POA: Diagnosis not present

## 2015-10-12 DIAGNOSIS — G250 Essential tremor: Secondary | ICD-10-CM | POA: Diagnosis not present

## 2015-10-12 DIAGNOSIS — I480 Paroxysmal atrial fibrillation: Secondary | ICD-10-CM | POA: Diagnosis not present

## 2015-10-12 DIAGNOSIS — I2510 Atherosclerotic heart disease of native coronary artery without angina pectoris: Secondary | ICD-10-CM | POA: Diagnosis not present

## 2015-10-12 DIAGNOSIS — I10 Essential (primary) hypertension: Secondary | ICD-10-CM | POA: Diagnosis not present

## 2015-10-15 DIAGNOSIS — R001 Bradycardia, unspecified: Secondary | ICD-10-CM | POA: Diagnosis not present

## 2015-10-15 DIAGNOSIS — I10 Essential (primary) hypertension: Secondary | ICD-10-CM | POA: Diagnosis not present

## 2015-11-08 DIAGNOSIS — I480 Paroxysmal atrial fibrillation: Secondary | ICD-10-CM | POA: Diagnosis not present

## 2015-11-08 DIAGNOSIS — G43009 Migraine without aura, not intractable, without status migrainosus: Secondary | ICD-10-CM | POA: Diagnosis not present

## 2015-11-08 DIAGNOSIS — I2510 Atherosclerotic heart disease of native coronary artery without angina pectoris: Secondary | ICD-10-CM | POA: Diagnosis not present

## 2015-11-08 DIAGNOSIS — I10 Essential (primary) hypertension: Secondary | ICD-10-CM | POA: Diagnosis not present

## 2015-11-08 DIAGNOSIS — E782 Mixed hyperlipidemia: Secondary | ICD-10-CM | POA: Diagnosis not present

## 2015-11-08 DIAGNOSIS — Z125 Encounter for screening for malignant neoplasm of prostate: Secondary | ICD-10-CM | POA: Diagnosis not present

## 2015-11-15 DIAGNOSIS — I2510 Atherosclerotic heart disease of native coronary artery without angina pectoris: Secondary | ICD-10-CM | POA: Diagnosis not present

## 2015-11-15 DIAGNOSIS — Z0000 Encounter for general adult medical examination without abnormal findings: Secondary | ICD-10-CM | POA: Diagnosis not present

## 2015-11-15 DIAGNOSIS — I480 Paroxysmal atrial fibrillation: Secondary | ICD-10-CM | POA: Diagnosis not present

## 2015-11-15 DIAGNOSIS — E782 Mixed hyperlipidemia: Secondary | ICD-10-CM | POA: Diagnosis not present

## 2015-11-15 DIAGNOSIS — I10 Essential (primary) hypertension: Secondary | ICD-10-CM | POA: Diagnosis not present

## 2015-11-24 DIAGNOSIS — I480 Paroxysmal atrial fibrillation: Secondary | ICD-10-CM | POA: Diagnosis not present

## 2015-11-24 DIAGNOSIS — I2510 Atherosclerotic heart disease of native coronary artery without angina pectoris: Secondary | ICD-10-CM | POA: Diagnosis not present

## 2015-11-24 DIAGNOSIS — E782 Mixed hyperlipidemia: Secondary | ICD-10-CM | POA: Diagnosis not present

## 2015-11-24 DIAGNOSIS — I10 Essential (primary) hypertension: Secondary | ICD-10-CM | POA: Diagnosis not present

## 2015-12-12 ENCOUNTER — Other Ambulatory Visit: Payer: Self-pay | Admitting: Specialist

## 2015-12-12 DIAGNOSIS — M720 Palmar fascial fibromatosis [Dupuytren]: Secondary | ICD-10-CM | POA: Diagnosis not present

## 2015-12-12 DIAGNOSIS — I482 Chronic atrial fibrillation: Secondary | ICD-10-CM | POA: Diagnosis not present

## 2016-01-03 ENCOUNTER — Encounter
Admission: RE | Admit: 2016-01-03 | Discharge: 2016-01-03 | Disposition: A | Discharge status: Home / Self Care | Payer: Commercial Managed Care - HMO | Source: Ambulatory Visit | Attending: Specialist | Admitting: Specialist

## 2016-01-03 HISTORY — DX: Inflammatory disease of prostate, unspecified: N41.9

## 2016-01-03 HISTORY — DX: Anemia, unspecified: D64.9

## 2016-01-03 HISTORY — DX: Calculus of kidney: N20.0

## 2016-01-03 HISTORY — DX: Headache: R51

## 2016-01-03 HISTORY — DX: Headache, unspecified: R51.9

## 2016-01-03 HISTORY — DX: Hyperlipidemia, unspecified: E78.5

## 2016-01-03 NOTE — Pre-Procedure Instructions (Addendum)
ANESTHESIA - ECG NARRATIVE 11/29/15 AND MOST RECENT CARDIOLOGY OFFICE NOTE AND LABS  Component Name Value Range  Vent Rate (bpm) 76   PR Interval (msec) 160   QRS Interval (msec) 90   QT Interval (msec) 374   QTc (msec) 420    Result Narrative  Sinus rhythm with premature atrial complexes Otherwise normal ECG When compared with ECG of 21-Apr-2014 10:29, premature atrial complexes are now present I reviewed and concur with this report. Electronically signed FI:[email or web address] MD, Darnell Level [phone]) on 11/29/2015 8:53:44 AM   Status   Progress Notes - in this encounter Flossie Dibble, MD - 11/24/2015 10:00 AM EDT Formatting of this note may be different from the original. Established Patient Visit   Chief Complaint: Chief Complaint  Patient presents with  . Follow-up  55mo  . Hypertension  Date of Service: 11/24/2015 Date of Birth: [id number]: VAzzie Glatter MD  History of Present Illness: Mr. DUnois a 82y.o.male patient  Essential Hypertension The patient has had a known diagnosis of essential hypertension without current evidence of secondary causes. Recently there has been consistent elevation of blood pressure beyond current appropriate guideline levels. We have discussed the concerns of increased cardiovascular complication and risks over the next many years and the need for possible adjustments to diet, lifestyle, and/or medications. Paroxysmal Non Valvular Atrial Fibrillation The patient has a diagnosis of paroxysmal nonvalvular atrial fibrillation. Episodes of atrial fibrillation appear to be occurring Rarely with associated symptoms of dizziness and an average time between episodes of approximately 1 years. This correlates to a severity of atrial fibrillation score of 0. Currently the patient has used medications for heart rate and maintenance of normal sinus rhythm including none. We have discussed risk factor management and causes of atrial fibrillation and the  need for treatment of hypertension, coronary artery disease, valve disease and structural heart disease. The patient has been on anticoagulation medications for stroke risk reduction. Hyperlipidemia The patient currently has a diagnosis of mixed hyperlipidemia. This includes moderate LDL cholesterol elevation. They have had treatment with pravastatin (Pravachol) which is considered Moderate intensity cholesterol therapy for reasons including high LDL, coronary artery disease, >7.5% ten year cardiovascular risk score and vascular disease. The patient has tolerated this medication at current levels without apparent significant side effects of the medication. We have had a long discussion about treatment goals and risk reduction of cardiovascular disease and therefore have not considered dosage and or medication changes with recent lipid levels HDLc of 48 mg/dl and LDLc of 84 mg/dl  Coronary Artery Disease The patient has had a diagnosis of coronary artery disease by cath years ago with current treatment including ACE inhibitors and statin therapy without apparent medication side effects. These medications and treatment are helping to modify risk factors including hypertension, hyperlipidemia, coronary artery disease and atrial fibrillation. There has been no current evidence of progression of angina and/or anginal equivalent with treatment listed above.  Past Medical and Surgical History  Past Medical History Past Medical History  Diagnosis Date  . Anemia, unspecified  . Atrial flutter (CMS-HCC)  . Cataract cortical, senile  . History of migraine headaches  . Hyperlipidemia, unspecified  . Hypertension  . Kidney stones  . PAF (paroxysmal atrial fibrillation) (CMS-HCC)  . Prostatitis   Past Surgical History He has a past surgical history that includes Cataract surgery; Dupuytren's contracture bilateral ; deviated septum; Cardiac catheterization (10/2009); Laser surgery on prostate and bladder stone  (2011); Colonoscopy (06/04/2007); Colonoscopy (10/06/1996, 04/02/2002); Sigmoidoscopy  Flexible (06/10/1992); Tonsillectomy and adenoidectomy; and Appendectomy.   Medications and Allergies  Current Medications  Current Outpatient Prescriptions  Medication Sig Dispense Refill  . cetirizine (ZYRTEC) 10 MG tablet Take 10 mg by mouth continuously as needed.   . cyanocobalamin (VITAMIN B12) 1000 MCG tablet Take 1,000 mcg by mouth once daily. Alternate with MVI  . doxepin (SINEQUAN) 10 MG capsule Take 1 capsule (10 mg total) by mouth nightly. 30 capsule 5  . multivitamin tablet Take 1 tablet by mouth once daily.  . pravastatin (PRAVACHOL) 20 MG tablet Take 20 mg by mouth nightly.   . SUMAtriptan (IMITREX) 100 MG tablet Take 1 tablet (100 mg total) by mouth as needed for Migraine. May take a second dose after 2 hours if needed. 9 tablet 5  . warfarin (COUMADIN) 3 MG tablet TAKE 1 TABLET EVERY NIGHT 90 tablet 3  . zolpidem (AMBIEN) 5 MG tablet Take 5-10 mg by mouth nightly as needed for Sleep.  Marland Kitchen lisinopril (PRINIVIL,ZESTRIL) 20 MG tablet Take 1 tablet (20 mg total) by mouth once daily. 90 tablet 4  . verapamil (CALAN) 40 MG tablet Take 1 tablet (40 mg total) by mouth 2 (two) times daily as needed. 30 tablet 3   No current facility-administered medications for this visit.   Allergies: Penicillins  Social and Family History  Social History reports that he has never smoked. He has never used smokeless tobacco. He reports that he does not drink alcohol.  Family History Family History  Problem Relation Age of Onset  . Colon cancer Mother  . Other Mother  CVA  . Stroke Mother  . Aneurysm Father   Review of Systems   Review of Systems  Positive for sob Negative for weight gain weight loss, weakness, vision change, hearing loss, cough, congestion, PND, orthopnea, heartburn, nausea, diaphoresis, vomiting, diarrhea, bloody stool, melena, stomach pain, extremity pain, leg weakness, leg  cramping, leg blood clots, headache, blackouts, nosebleed, trouble swallowing, mouth pain, urinary frequency, urination at night, muscle weakness, skin lesions, skin rashes, tingling ,ulcers, numbness, anxiety,  Physical Examination   Vitals: Visit Vitals  . BP (!) 152/98 (BP Location: Left upper arm, Patient Position: Sitting, BP Cuff Size: Adult)  . Pulse 86  . Resp 16  . Ht 177.8 cm ('5\' 10"'$ )  . Wt 92 kg (202 lb 12.8 oz)  . SpO2 95%  . BMI 29.1 kg/m2   Ht:177.8 cm ('5\' 10"'$ ) Wt:92 kg (202 lb 12.8 oz) NGE:XBMW surface area is 2.13 meters squared. Body mass index is 29.1 kg/(m^2). Appearance: well appearing in no acute distress HEENT: Pupils equally reactive to light and accomodation, no xanthalasma  Neck: Supple, no apparent thyromegaly, or lymphadenopathy  Lungs: normal respiratory effort; no crackles, no rhonchi, no wheezes Heart: Regular rate and rhythm. Normal S1 S2 No gallops, murmur, PMI is normal size and placement. carotid upstroke normal without bruit. Jugular venous pressure is normal Abdomen: soft, nontender, not distended with normal bowel sounds. No apparent hepatosplenomegally. Abdominal aorta is normal size without bruit Extremities: no edema, no ulcers, no clubbing, no cyanosis Peripheral Pulses: 1+ in upper extremities, 1+ femoral pulses bilaterally, 2+lower extremity  Musculoskeletal; Normal muscle tone without kyphosis Neurological: Oriented and Alert, Cranial nerves intact  Assessment   82 y.o. male with  Encounter Diagnoses  Name Primary?  . Paroxysmal a-fib (CMS-HCC)  . Hyperlipidemia, mixed  . Benign essential hypertension Yes  . Atherosclerosis of native coronary artery of native heart without angina pectoris   Plan  -No change  in current and appropriate medication management of atrial fibrillation and coexisting risk factors -Continue moderate to high intensive cholesterol therapy for further future risk reduction in cardiovascular disease and  complication. The patient currently understands the goals, risks, and benefits of lipid treatment. We have discussed the potential side effects profile of these medications and or symptoms. They will watch for any new symptoms. -The patient understands all risks of future cardiovascular disease process based on discussion today. We will continue all risk factor modification and prevention including lipid management, exercise and diet, blood pressure control, and anti-platelet medication management as tolerated. -The patient will need a change in treatment management of hypertension for better hypertension treatment and better risk reduction of cardiovascular disease. We plan to Increase ace inhibitor  Orders Placed This Encounter  Procedures  . ECG 12-lead   Return in about 6 months (around 05/26/2016).  Flossie Dibble, MD    Plan of Treatment - as of this encounter Upcoming Encounters Upcoming Encounters  Date Type Specialty Care Team Description  01/09/2016 Ancillary Orders Lab Flossie Dibble, MD  [address]  [facility]  [place], [place] [address]  [phone]  [phone] (Fax)    05/10/2016 Ancillary Orders Lab Azzie Glatter, MD  [address]  [place], [place] [address]  [phone]  [phone] ([phone]    05/17/2016 Office Visit Internal Medicine Azzie Glatter, MD  [address]  [place], [place] [address]  [phone]  [phone] ([phone]    05/28/2016 Office Visit Cardiology Flossie Dibble, MD  [address]  [facility]  [place], [place] [address]  [phone]  [phone] (Fax)    EKG Results - in this encounter   ECG 12-lead (11/24/2015 10:17 AM) ECG 12-lead (11/24/2015 10:17 AM)  Component Value Ref Range  Vent Rate (bpm) 76   PR Interval (msec) 160   QRS Interval (msec) 90   QT  Interval (msec) 374   QTc (msec) 420    ECG 12-lead (11/24/2015 10:17 AM)  Specimen Performing Laboratory   [facility] GE MUSE RESULTS    ECG 12-lead (11/24/2015 10:17 AM)  Narrative  Sinus rhythm with premature atrial complexes  Otherwise normal ECG  When compared with ECG of 21-Apr-2014 10:29,  premature atrial complexes are now present  I reviewed and concur with this report. Electronically signed [email or web address] MD, Darnell Level [id number]) on 11/29/2015 8:53:44 AM   Visit Diagnoses - in this encounter Diagnosis  Benign essential hypertension - Primary  Essential hypertension, benign   Paroxysmal a-fib (CMS-HCC)  Hyperlipidemia, mixed  Mixed hyperlipidemia   Atherosclerosis of native coronary artery of native heart without angina pectoris  Discontinued Medications - as of this encounter Prescription Sig. Discontinue Reason Start Date End Date  lisinopril (PRINIVIL,ZESTRIL) 10 MG tablet  Take 1 tablet (10 mg total) by mouth once daily. Alternate therapy 09/12/2015 11/24/2015  Document Information Service Providers Document Coverage Dates Mar. 30, 2017 - Mar. 30, 2017 [place] [phone] (Work) [place], [place] [address] Encounter Providers Darnell Level Lenn Sink MD (Attending) [phone] (Work) [phone] (Fax)  [address] [facility] [place],  [address]    Urinalysis w/Microscopic (11/08/2015 7:31 AM) Urinalysis w/Microscopic (11/08/2015 7:31 AM)  Component Value Ref Range  Color Yellow Yellow, Straw  Clarity Clear Clear  Specific Gravity 1.020 1.000 - 1.030  pH, Urine 5.5 5.0 - 8.0  Protein, Urinalysis Negative Negative, Trace mg/dL  Glucose, Urinalysis Negative Negative  mg/dL  Ketones, Urinalysis Negative Negative mg/dL  Blood, Urinalysis Trace (A) Negative  Nitrite, Urinalysis Negative Negative  Leukocyte Esterase, Urinalysis Negative Negative  White Blood Cells, Urinalysis None Seen None Seen, 0-3  /hpf  Red Blood Cells, Urinalysis None Seen None Seen, 0-3 /hpf  Bacteria, Urinalysis None Seen None Seen /hpf  Squamous Epithelial Cells, Urinalysis None Seen Rare, Few, None Seen /hpf   Urinalysis w/Microscopic (11/08/2015 7:31 AM)  Specimen Performing Laboratory  Urine [facility] - LAB  [place], [place] [address]   Back to top of Lab Results   Thyroid Stimulating-Hormone (TSH) (11/08/2015 7:31 AM) Thyroid Stimulating-Hormone (TSH) (11/08/2015 7:31 AM)  Component Value Ref Range  Thyroid Stimulating Hormone (TSH) 2.512 0.340 - 5.600 uIU/mL   Thyroid Stimulating-Hormone (TSH) (11/08/2015 7:31 AM)  Specimen Performing Laboratory  Blood [place]  [place], [place] [address]   Back to top of Lab Results   Lipid Panel w/calc LDL (11/08/2015 7:31 AM) Lipid Panel w/calc LDL (11/08/2015 7:31 AM)  Component Value Ref Range  Cholesterol, Total 153 100 - 200 mg/dL  Triglyceride 102 35 - 199 mg/dL  HDL (High Density Lipoprotein) Cholesterol 48.2 29.0 - 71.0 mg/dL  LDL (Low Density Lipoprotien), Calculated 84 0 - 130 mg/dL  VLDL Cholesterol 20 mg/dL  Cholesterol/HDL Ratio 3.2    Lipid Panel w/calc LDL (11/08/2015 7:31 AM)  Specimen Performing Laboratory  Blood [facility] - LAB  [place], [place] [address]   Back to top of Lab Results   Comprehensive Metabolic Panel (CMP) (16/60/6301 7:31 AM) Comprehensive Metabolic Panel (CMP) (60/05/9322 7:31 AM)  Component Value Ref Range  Glucose 83 70 - 110 mg/dL  Sodium 141 136 - 145 mmol/L  Potassium 4.3 3.6 - 5.1 mmol/L  Chloride 104 97 - 109 mmol/L  Carbon Dioxide (CO2) 29.0 22.0 - 32.0 mmol/L  Urea Nitrogen (BUN) 35 (H) 7 - 25 mg/dL  Creatinine 1.2 0.7 - 1.3 mg/dL  Glomerular Filtration Rate (eGFR), MDRD Estimate 58 (L) >60 mL/min/1.73sq m  Calcium 9.0 8.7 - 10.3 mg/dL  AST  23 8 - 39 U/L  ALT  14 6 - 57 U/L  Alk  Phos (alkaline Phosphatase) 46 34 - 104 U/L  Albumin 3.8 3.5 - 4.8 g/dL  Bilirubin, Total 0.5 0.3 - 1.2 mg/dL  Protein, Total 6.1 6.1 - 7.9 g/dL  A/G Ratio 1.7 1.0 - 5.0 gm/dL   Comprehensive Metabolic Panel (CMP) (55/73/2202 7:31 AM)  Specimen Performing Laboratory  Blood [facility] - LAB  [place],  [address]   Back to top of Lab Results   CBC w/auto Differential (5 Part) (11/08/2015 7:31 AM) CBC w/auto Differential (5 Part) (11/08/2015 7:31 AM)  Component Value Ref Range  WBC (White Blood Cell Count) 4.9 4.1 - 10.2 10^3/uL  RBC (Red Blood Cell Count) 4.79 4.69 - 6.13 10^6/uL  Hemoglobin 14.2 14.1 - 18.1 gm/dL  Hematocrit 43.8 40.0 - 52.0 %  MCV (Mean Corpuscular Volume) 91.4 80.0 - 100.0 fl  MCH (Mean Corpuscular Hemoglobin) 29.6 27.0 - 31.2 pg  MCHC (Mean Corpuscular Hemoglobin Concentration) 32.4 32.0 - 36.0 gm/dL  Platelet Count 149 (L) 150 - 450 10^3/uL  RDW-CV (Red Cell Distribution Width) 13.6 11.6 - 14.8 %  MPV (Mean Platelet Volume) 10.9 (H) 8.0 - 10.0 fl  Neutrophils 2.43 1.50 - 7.80 10^3/uL  Lymphocytes 1.88 1.00 - 3.60 10^3/uL  Monocytes 0.46 0.00 -  1.50 10^3/uL  Eosinophils 0.13 0.00 - 0.55 10^3/uL  Basophils 0.02 0.00 - 0.09 10^3/uL  Neutrophil % 49.5 32.0 - 70.0 %  Lymphocyte % 38.2 10.0 - 50.0 %  Monocyte % 9.3 4.0 - 13.0 %  Eosinophil % 2.6 1.0 - 5.0 %  Basophil% 0.4 0.0 - 2.0 %  Immature Granulocyte % 0.0 <=0.7 %  Immature Granulocyte Count 0.00 <=0.06 10^3/L   CBC w/auto Differential (5 Part) (11/08/2015 7:31 AM)  Specimen Performing Laboratory  Blood [facility] - LAB  [place], [place] [address]   Back to top of Lab Results Visit Diagnoses - in this encounter Diagnosis  Paroxysmal a-fib (CMS-HCC)  Migraine without aura and without status migrainosus, not intractable  Benign essential hypertension  Essential hypertension, benign   Hyperlipidemia, mixed  Mixed  hyperlipidemia   Atherosclerosis of native coronary artery of native heart without angina pectoris  Document Information Service Providers Document Coverage Dates Mar. 14, 2017 - Mar. 14, 2017 [place] [phone] (Work) [place], [place] [address] Encounter Providers [facility] [place] (Attending) Encounter Date Mar. 14, 2017 - Mar. 14, 2017

## 2016-01-03 NOTE — Patient Instructions (Signed)
Your procedure is scheduled on: Wednesday 01/11/16 Report to Day Surgery. 2ND FLOOR MEDCIAL MALL ENTRANCE To find out your arrival time please call [phone] between 1PM - 3PM on Tuesday 01/10/16.  Remember: Instructions that are not followed completely may result in serious medical risk, up to and including death, or upon the discretion of your surgeon and anesthesiologist your surgery may need to be rescheduled.    __X__ 1. Do not eat food or drink liquids after midnight. No gum chewing or hard candies.     __X__ 2. No Alcohol for 24 hours before or after surgery.   ____ 3. Bring all medications with you on the day of surgery if instructed.    __X__ 4. Notify your doctor if there is any change in your medical condition     (cold, fever, infections).     Do not wear jewelry, make-up, hairpins, clips or nail polish.  Do not wear lotions, powders, or perfumes.   Do not shave 48 hours prior to surgery. Men may shave face and neck.  Do not bring valuables to the hospital.    [facility] is not responsible for any belongings or valuables.               Contacts, dentures or bridgework may not be worn into surgery.  Leave your suitcase in the car. After surgery it may be brought to your room.  For patients admitted to the hospital, discharge time is determined by your                treatment team.   Patients discharged the day of surgery will not be allowed to drive home.   Please read over the following fact sheets that you were given:   Surgical Site Infection Prevention   ____ Take these medicines the morning of surgery with A SIP OF WATER:    1. TAKE MEDS AS USUAL NIGHT BEFORE  2.   3.   4.  5.  6.  ____ Fleet Enema (as directed)   __X__ Use CHG Soap as directed  ____ Use inhalers on the day of surgery  ____ Stop metformin 2 days prior to surgery    ____ Take 1/2 of usual insulin dose the night before surgery and none on the morning of surgery.   __X__ Stop  Coumadin/Plavix/aspirin on AS PREVIOUSLY INSTRUCTED 5 DAYS PRIOR TO SURGERY  ____ Stop Anti-inflammatories on    __X__ Stop supplements until after surgery.  B12  ____ Bring C-Pap to the hospital.

## 2016-01-11 ENCOUNTER — Encounter: Payer: Self-pay | Admitting: *Deleted

## 2016-01-11 ENCOUNTER — Encounter
Admission: RE | Disposition: A | Discharge status: Home / Self Care | Payer: Self-pay | Source: Ambulatory Visit | Attending: Specialist

## 2016-01-11 ENCOUNTER — Ambulatory Visit
Admission: RE | Admit: 2016-01-11 | Discharge: 2016-01-11 | Disposition: A | Discharge status: Home / Self Care | Payer: Commercial Managed Care - HMO | Source: Ambulatory Visit | Attending: Specialist | Admitting: Specialist

## 2016-01-11 ENCOUNTER — Ambulatory Visit: Payer: Commercial Managed Care - HMO | Admitting: Anesthesiology

## 2016-01-11 DIAGNOSIS — D649 Anemia, unspecified: Secondary | ICD-10-CM | POA: Diagnosis not present

## 2016-01-11 DIAGNOSIS — R51 Headache: Secondary | ICD-10-CM | POA: Insufficient documentation

## 2016-01-11 DIAGNOSIS — E785 Hyperlipidemia, unspecified: Secondary | ICD-10-CM | POA: Diagnosis not present

## 2016-01-11 DIAGNOSIS — M720 Palmar fascial fibromatosis [Dupuytren]: Secondary | ICD-10-CM | POA: Diagnosis not present

## 2016-01-11 DIAGNOSIS — Z880 Allergy status to penicillin: Secondary | ICD-10-CM | POA: Insufficient documentation

## 2016-01-11 DIAGNOSIS — Z87442 Personal history of urinary calculi: Secondary | ICD-10-CM | POA: Diagnosis not present

## 2016-01-11 DIAGNOSIS — Z9842 Cataract extraction status, left eye: Secondary | ICD-10-CM | POA: Insufficient documentation

## 2016-01-11 DIAGNOSIS — I4891 Unspecified atrial fibrillation: Secondary | ICD-10-CM | POA: Insufficient documentation

## 2016-01-11 DIAGNOSIS — Z79899 Other long term (current) drug therapy: Secondary | ICD-10-CM | POA: Diagnosis not present

## 2016-01-11 DIAGNOSIS — Z7901 Long term (current) use of anticoagulants: Secondary | ICD-10-CM | POA: Insufficient documentation

## 2016-01-11 DIAGNOSIS — Z9841 Cataract extraction status, right eye: Secondary | ICD-10-CM | POA: Diagnosis not present

## 2016-01-11 HISTORY — PX: DUPUYTREN CONTRACTURE RELEASE: SHX1478

## 2016-01-11 LAB — PROTIME-INR
INR: 1.16
PROTHROMBIN TIME: 15 s (ref 11.4–15.0)

## 2016-01-11 SURGERY — RELEASE, DUPUYTREN CONTRACTURE
Anesthesia: General | Site: Hand | Laterality: Right | Wound class: Clean

## 2016-01-11 MED ORDER — GABAPENTIN 400 MG PO CAPS: 400.0000 mg | ORAL_CAPSULE | Freq: Three times a day (TID) | ORAL | Status: DC

## 2016-01-11 MED ORDER — FENTANYL CITRATE (PF) 100 MCG/2ML IJ SOLN: 25.0000 ug | INTRAMUSCULAR | Status: DC | PRN

## 2016-01-11 MED ORDER — CEFAZOLIN SODIUM-DEXTROSE 2-4 GM/100ML-% IV SOLN: 2.0000 g | INTRAVENOUS | Status: DC

## 2016-01-11 MED ORDER — LIDOCAINE HCL (CARDIAC) 20 MG/ML IV SOLN
INTRAVENOUS | Status: DC | PRN
  Administered 2016-01-11: 60 mg via INTRAVENOUS

## 2016-01-11 MED ORDER — FENTANYL CITRATE (PF) 100 MCG/2ML IJ SOLN
INTRAMUSCULAR | Status: DC | PRN
  Administered 2016-01-11 (×2): 25 ug via INTRAVENOUS

## 2016-01-11 MED ORDER — EPHEDRINE SULFATE 50 MG/ML IJ SOLN
INTRAMUSCULAR | Status: DC | PRN
  Administered 2016-01-11 (×7): 10 mg via INTRAVENOUS

## 2016-01-11 MED ORDER — HYDROCODONE-ACETAMINOPHEN 5-325 MG PO TABS: 1.0000 | ORAL_TABLET | Freq: Four times a day (QID) | ORAL | Status: DC | PRN

## 2016-01-11 MED ORDER — CEFAZOLIN SODIUM-DEXTROSE 2-4 GM/100ML-% IV SOLN
INTRAVENOUS | Status: AC
  Filled 2016-01-11: qty 100

## 2016-01-11 MED ORDER — CLINDAMYCIN PHOSPHATE 600 MG/50ML IV SOLN
600.0000 mg | Freq: Once | INTRAVENOUS | Status: AC
  Administered 2016-01-11: 600 mg via INTRAVENOUS

## 2016-01-11 MED ORDER — OXYCODONE HCL 5 MG PO TABS: 5.0000 mg | ORAL_TABLET | Freq: Once | ORAL | Status: DC | PRN

## 2016-01-11 MED ORDER — FAMOTIDINE 20 MG PO TABS
ORAL_TABLET | ORAL | Status: AC
  Filled 2016-01-11: qty 1

## 2016-01-11 MED ORDER — OXYCODONE HCL 5 MG/5ML PO SOLN: 5.0000 mg | Freq: Once | ORAL | Status: DC | PRN

## 2016-01-11 MED ORDER — FAMOTIDINE 20 MG PO TABS
20.0000 mg | ORAL_TABLET | Freq: Once | ORAL | Status: AC
  Administered 2016-01-11: 20 mg via ORAL

## 2016-01-11 MED ORDER — BUPIVACAINE HCL (PF) 0.5 % IJ SOLN
INTRAMUSCULAR | Status: AC
  Filled 2016-01-11: qty 30

## 2016-01-11 MED ORDER — GABAPENTIN 400 MG PO CAPS
400.0000 mg | ORAL_CAPSULE | Freq: Once | ORAL | Status: AC
  Administered 2016-01-11: 400 mg via ORAL

## 2016-01-11 MED ORDER — CHLORHEXIDINE GLUCONATE 4 % EX LIQD
1.0000 "application " | Freq: Once | CUTANEOUS | Status: AC
  Administered 2016-01-11: 1 via TOPICAL

## 2016-01-11 MED ORDER — PHENYLEPHRINE HCL 10 MG/ML IJ SOLN
INTRAMUSCULAR | Status: DC | PRN
  Administered 2016-01-11 (×6): 100 ug via INTRAVENOUS

## 2016-01-11 MED ORDER — GABAPENTIN 400 MG PO CAPS
ORAL_CAPSULE | ORAL | Status: AC
  Administered 2016-01-11: 400 mg via ORAL
  Filled 2016-01-11: qty 1

## 2016-01-11 MED ORDER — LACTATED RINGERS IV SOLN
INTRAVENOUS | Status: DC
  Administered 2016-01-11: 10:00:00 via INTRAVENOUS

## 2016-01-11 MED ORDER — BUPIVACAINE HCL (PF) 0.5 % IJ SOLN
INTRAMUSCULAR | Status: DC | PRN
  Administered 2016-01-11: 30 mL

## 2016-01-11 MED ORDER — CLINDAMYCIN PHOSPHATE 600 MG/50ML IV SOLN
INTRAVENOUS | Status: AC
  Filled 2016-01-11: qty 50

## 2016-01-11 SURGICAL SUPPLY — 31 items
BLADE SURG MINI STRL (BLADE) ×2 IMPLANT
BNDG ESMARK 4X12 TAN STRL LF (GAUZE/BANDAGES/DRESSINGS) ×2 IMPLANT
CANISTER SUCT 1200ML W/VALVE (MISCELLANEOUS) ×2 IMPLANT
CHLORAPREP W/TINT 26ML (MISCELLANEOUS) ×2 IMPLANT
CUFF TOURN 18 STER (MISCELLANEOUS) ×2 IMPLANT
DECANTER SPIKE VIAL GLASS SM (MISCELLANEOUS) ×2 IMPLANT
ELECT REM PT RETURN 9FT ADLT (ELECTROSURGICAL) ×2
ELECTRODE REM PT RTRN 9FT ADLT (ELECTROSURGICAL) ×1 IMPLANT
GAUZE FLUFF 18X24 1PLY STRL (GAUZE/BANDAGES/DRESSINGS) ×2 IMPLANT
GAUZE PETRO XEROFOAM 1X8 (MISCELLANEOUS) ×4 IMPLANT
GAUZE SPONGE 4X4 12PLY STRL (GAUZE/BANDAGES/DRESSINGS) ×2 IMPLANT
GLOVE SURG ORTHO 8.0 STRL STRW (GLOVE) ×2 IMPLANT
GOWN STRL REUS W/ TWL LRG LVL3 (GOWN DISPOSABLE) ×2 IMPLANT
GOWN STRL REUS W/TWL LRG LVL3 (GOWN DISPOSABLE) ×2
KIT RM TURNOVER STRD PROC AR (KITS) ×2 IMPLANT
NS IRRIG 500ML POUR BTL (IV SOLUTION) ×2 IMPLANT
PACK EXTREMITY ARMC (MISCELLANEOUS) ×2 IMPLANT
PAD CAST CTTN 4X4 STRL (SOFTGOODS) ×2 IMPLANT
PADDING CAST COTTON 4X4 STRL (SOFTGOODS) ×2
SPLINT CAST 1 STEP 3X12 (MISCELLANEOUS) IMPLANT
SPLINT CAST 1 STEP 4X15 (MISCELLANEOUS) ×2 IMPLANT
STOCKINETTE BIAS CUT 4 980044 (GAUZE/BANDAGES/DRESSINGS) ×2 IMPLANT
STOCKINETTE STRL 4IN 9604848 (GAUZE/BANDAGES/DRESSINGS) ×2 IMPLANT
STRAP SAFETY BODY (MISCELLANEOUS) ×2 IMPLANT
SUT ETHILON 4-0 (SUTURE) ×3
SUT ETHILON 4-0 FS2 18XMFL BLK (SUTURE) ×3
SUT ETHILON 5-0 (SUTURE) ×4
SUT ETHILON 5-0 C-3 18XMFL BLK (SUTURE) ×4
SUT VIC AB 4-0 FS2 27 (SUTURE) ×2 IMPLANT
SUTURE ETHLN 4-0 FS2 18XMF BLK (SUTURE) ×3 IMPLANT
SUTURE ETHLN 5-0 C3 18XMF BLK (SUTURE) ×4 IMPLANT

## 2016-01-11 NOTE — H&P (Signed)
THE PATIENT WAS SEEN PRIOR TO SURGERY TODAY.  HISTORY, ALLERGIES, HOME MEDICATIONS AND OPERATIVE PROCEDURE WERE REVIEWED. RISKS AND BENEFITS OF SURGERY DISCUSSED WITH PATIENT AGAIN.  NO CHANGES FROM INITIAL HISTORY AND PHYSICAL NOTED.    

## 2016-01-11 NOTE — Anesthesia Procedure Notes (Signed)
Procedure Name: LMA Insertion Date/Time: 01/11/2016 11:19 AM Performed by: Delaney Meigs Pre-anesthesia Checklist: Patient identified, Emergency Drugs available, Suction available, Patient being monitored and Timeout performed Patient Re-evaluated:Patient Re-evaluated prior to inductionOxygen Delivery Method: Circle system utilized and Simple face mask Preoxygenation: Pre-oxygenation with 100% oxygen Intubation Type: IV induction Ventilation: Mask ventilation without difficulty LMA Size: 4.5 Number of attempts: 1 Placement Confirmation: breath sounds checked- equal and bilateral and positive ETCO2 Tube secured with: Tape

## 2016-01-11 NOTE — H&P (Signed)
Ryan Pace is an 82 y.o. male.   Chief Complaint: Dupuytren;s disease with contracture right hand, thumb, index, and middle fingers.  HPI: 82 y/o male with severe Dupuytren's disease right hand involving the thumb, index, and middle fingers with contracture.  Requests surgery.  Has had prior release of ring and small fingers .  Risks and benefits discussed with him along with post op protocol.    Past Medical History  Diagnosis Date  . Dysrhythmia     atrial fibrillation  . Anemia   . Headache   . Hyperlipidemia   . Kidney stones   . Prostatitis     Past Surgical History  Procedure Laterality Date  . Cardiac catheterization    . Dupuytren contracture release Bilateral   . Green light laser turp (transurethral resection of prostate N/A 02/15/2015    Procedure: GREEN LIGHT LASER TURP (TRANSURETHRAL RESECTION OF PROSTATE;  Surgeon: Royston Cowper, MD;  Location: [facility] ORS;  Service: Urology;  Laterality: N/A;  . Cataract extraction, bilateral    . Tonsillectomy    . Appendectomy    . Colonoscopy      1998, 2003, 2011  . Flexible sigmoidoscopy      1993    History reviewed. No pertinent family history. Social History:  reports that he has never smoked. He has quit using smokeless tobacco. He reports that he does not drink alcohol or use illicit drugs.  Allergies:  Allergies  Allergen Reactions  . Penicillins Rash    Medications Prior to Admission  Medication Sig Dispense Refill  . cetirizine (ZYRTEC) 5 MG tablet Take 5-10 mg by mouth daily as needed for allergies.     . Cyanocobalamin (VITAMIN B-12 PO) Take 1 tablet by mouth at bedtime.    . Doxepin HCl 6 MG TABS Take 1 tablet by mouth at bedtime.    Marland Kitchen lisinopril (PRINIVIL,ZESTRIL) 20 MG tablet Take 20 mg by mouth at bedtime.    . Multiple Vitamins-Minerals (MULTIVITAMIN WITH MINERALS) tablet Take 1 tablet by mouth daily.    . pravastatin (PRAVACHOL) 20 MG tablet Take 20 mg by mouth at bedtime.     . SUMAtriptan  (IMITREX) 50 MG tablet Take 50 mg by mouth as needed for migraine. May repeat in 2 hours if headache persists or recurs.    . temazepam (RESTORIL) 15 MG capsule Take 15 mg by mouth at bedtime as needed for sleep.    Marland Kitchen warfarin (COUMADIN) 3 MG tablet Take 3 mg by mouth daily.      Results for orders placed or performed during the hospital encounter of 01/11/16 (from the past 48 hour(s))  Protime-INR     Status: None   Collection Time: 01/11/16  9:50 AM  Result Value Ref Range   Prothrombin Time 15.0 11.4 - 15.0 seconds   INR 1.16    No results found.  Review of Systems  Constitutional: Negative.   HENT: Negative.   Eyes: Negative.   Respiratory: Negative.   Gastrointestinal: Negative.   Musculoskeletal: Negative.   Skin: Negative.   Neurological: Negative.   Endo/Heme/Allergies: Negative.   Psychiatric/Behavioral: Negative.     Blood pressure 150/87, pulse 90, temperature 96.3 F (35.7 C), resp. rate 16, weight 90.719 kg (200 lb), SpO2 100 %. Physical Exam  Constitutional: He is oriented to person, place, and time. He appears well-developed and well-nourished.  HENT:  Head: Normocephalic.  Eyes: Pupils are equal, round, and reactive to light.  Neck: Normal range of motion.  Cardiovascular:  Normal rate and regular rhythm.   Respiratory: Effort normal.  GI: Soft.  Neurological: He is alert and oriented to person, place, and time.  Skin: Skin is warm.  Psychiatric: He has a normal mood and affect.     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  CSM good.  Skin intact.  Healed incisions to ring and small fingers.    Assessment/Plan Dupuytren' contracture right hand-- excision of disease planned.    Park Breed, MD 01/11/2016, 10:54 AM

## 2016-01-11 NOTE — Anesthesia Preprocedure Evaluation (Signed)
Anesthesia Evaluation  Patient identified by MRN, date of birth, ID band Patient awake    Reviewed: Allergy & Precautions, H&P , NPO status , Patient's Chart, lab work & pertinent test results  Airway Mallampati: II  TM Distance: >3 FB Neck ROM: limited    Dental  (+) Poor Dentition, Chipped, Missing, Partial Upper   Pulmonary neg pulmonary ROS, neg shortness of breath,    Pulmonary exam normal breath sounds clear to auscultation       Cardiovascular Exercise Tolerance: Good (-) angina(-) Past MI and (-) DOE Normal cardiovascular exam+ dysrhythmias Atrial Fibrillation  Rhythm:regular Rate:Normal     Neuro/Psych  Headaches, negative psych ROS   GI/Hepatic negative GI ROS, Neg liver ROS,   Endo/Other  negative endocrine ROS  Renal/GU Renal disease  negative genitourinary   Musculoskeletal   Abdominal   Peds  Hematology negative hematology ROS (+)   Anesthesia Other Findings Past Medical History:   Dysrhythmia                                                    Comment:atrial fibrillation   Anemia                                                       Headache                                                     Hyperlipidemia                                               Kidney stones                                                Prostatitis                                                 Past Surgical History:   CARDIAC CATHETERIZATION                                       DUPUYTREN CONTRACTURE RELEASE                   Bilateral              GREEN LIGHT LASER TURP (TRANSURETHRAL RESECTIO* N/A 02/15/2015      Comment:Procedure: GREEN LIGHT LASER TURP               (TRANSURETHRAL RESECTION OF PROSTATE;  Surgeon:  Royston Cowper, MD;  Location: [facility] ORS;                Service: Urology;  Laterality: N/A;   CATARACT EXTRACTION, BILATERAL                                TONSILLECTOMY                                                  APPENDECTOMY                                                  COLONOSCOPY                                                     Comment:1998, 2003, 2011   FLEXIBLE SIGMOIDOSCOPY                                          Comment:1993  BMI    Body Mass Index   27.90 kg/m 2      Reproductive/Obstetrics negative OB ROS                             Anesthesia Physical Anesthesia Plan  ASA: III  Anesthesia Plan: General LMA   Post-op Pain Management:    Induction:   Airway Management Planned:   Additional Equipment:   Intra-op Plan:   Post-operative Plan:   Informed Consent: I have reviewed the patients History and Physical, chart, labs and discussed the procedure including the risks, benefits and alternatives for the proposed anesthesia with the patient or authorized representative who has indicated his/her understanding and acceptance.   Dental Advisory Given  Plan Discussed with: Anesthesiologist, CRNA and Surgeon  Anesthesia Plan Comments:         Anesthesia Quick Evaluation

## 2016-01-11 NOTE — Discharge Instructions (Signed)

## 2016-01-11 NOTE — Op Note (Signed)
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  ANESTHESIA:  General LMA  PREOPERATIVE INDICATIONS:  Ryan Pace is a  82 y.o. 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    The risks benefits and alternatives were discussed with the patient preoperatively including but not limited to the risks of infection, bleeding, nerve injury, cardiopulmonary complications, the need for revision surgery, among others, and the patient was willing to proceed.  OPERATIVE IMPLANTS: None  OPERATIVE FINDINGS: Cord in palm to PIP of middle finger.  Cord in 1st web space to PIP of index finger  OPERATIVE PROCEDURE: The patient was brought to the operating room where he underwent satisfactory general LMA anesthesia in the supine position.  The  arm was prepped and draped in a sterile fashion.  Esmarch was applied and tourniquet inflated to 250 mmHg.   A Bruner zigzag incision was made starting at the distal portion of the middle finger crossing the MP level and extending down into the palm.  Careful, tedious dissection was carried out using loupe magnification.  The large cord in the palm was transected and dissection was then carried out distally, carefully protecting the radial and ulnar digital nerves and vessels to the small finger.  The dissection was carried out onto the finger and released from its attachments to the skin and tendon sheaths.  Both neurovascular bundles remained intact.  Dissection was then carried out more proximally and the proximal portion of the palmar fascia was excised.  Fascia was then excised out  over towards the index  finger as well.  The contracture was completely released. Another zig-zag incision was made over the 1st web and extended distally along the radial side of the index finger. Dissection was again carried out to excise the diseased tissue completely. Neurovascular structures were again protected.  Sponges were applied to the wound and the tourniquet was deflated and showed excellent return of blood flow to all fingers.  After bleeding was controlled, the wound was closed with 5-0 and 4-0 nylon sutures. Sponge and needle counts were correct.  Half percent Marcaine was infiltrated into the tissues.  Dry sterile hand dressing and volar splint were applied.  Patient was awakened and taken to recovery in good condition.   Park Breed, M.D.

## 2016-01-11 NOTE — Transfer of Care (Signed)
Immediate Anesthesia Transfer of Care Note  Patient: Ryan Pace  Procedure(s) Performed: Procedure(s): DUPUYTREN CONTRACTURE RELEASE (Right)  Patient Location: PACU  Anesthesia Type:General  Level of Consciousness: sedated  Airway & Oxygen Therapy: Patient Spontanous Breathing and Patient connected to face mask oxygen  Post-op Assessment: Report given to RN and Post -op Vital signs reviewed and stable  Post vital signs: Reviewed and stable  Last Vitals:  Filed Vitals:   01/11/16 1036 01/11/16 1344  BP: 150/87 116/72  Pulse:  90  Temp:  36.5 C  Resp:  16    Last Pain: There were no vitals filed for this visit.       Complications: No apparent anesthesia complications

## 2016-01-12 LAB — SURGICAL PATHOLOGY

## 2016-01-12 NOTE — Anesthesia Postprocedure Evaluation (Signed)
Anesthesia Post Note  Patient: Ryan Pace  Procedure(s) Performed: Procedure(s) (LRB): DUPUYTREN CONTRACTURE RELEASE (Right)  Patient location during evaluation: PACU Anesthesia Type: General Level of consciousness: awake and alert Pain management: pain level controlled Vital Signs Assessment: post-procedure vital signs reviewed and stable Respiratory status: spontaneous breathing, nonlabored ventilation, respiratory function stable and patient connected to nasal cannula oxygen Cardiovascular status: blood pressure returned to baseline and stable Postop Assessment: no signs of nausea or vomiting Anesthetic complications: no    Last Vitals:  Filed Vitals:   01/11/16 1438 01/11/16 1508  BP: 126/65 116/76  Pulse: 89   Temp: 36 C   Resp: 16 16    Last Pain:  Filed Vitals:   01/12/16 0820  PainSc: 1                  Ryan Pace

## 2016-01-16 DIAGNOSIS — M720 Palmar fascial fibromatosis [Dupuytren]: Secondary | ICD-10-CM | POA: Diagnosis not present

## 2016-02-03 ENCOUNTER — Ambulatory Visit: Payer: Commercial Managed Care - HMO | Attending: Specialist | Admitting: Occupational Therapy

## 2016-02-03 DIAGNOSIS — L905 Scar conditions and fibrosis of skin: Secondary | ICD-10-CM

## 2016-02-03 DIAGNOSIS — M6281 Muscle weakness (generalized): Secondary | ICD-10-CM | POA: Diagnosis not present

## 2016-02-03 DIAGNOSIS — M25641 Stiffness of right hand, not elsewhere classified: Secondary | ICD-10-CM

## 2016-02-03 NOTE — Patient Instructions (Signed)
Pt to do heat  Scar massage  Scar pad and silicon digi sleeve provided for 4th digit and palmar scar for night time use - ed on use  Wrist extention stretch  PROM for DIP flexion R 3rd thru 5th  Tendon glides Block Intrinsic fist  Tapping of digits RD of digits  8-10 reps  2 x day

## 2016-02-03 NOTE — Therapy (Signed)
[facility] [address] [place], [address] Phone: [phone]   Fax:  [phone]  Occupational Therapy Treatment  Patient Details  Name: Ryan Pace MRN: [id number] Date of Birth: [birth year] Referring Provider: Sabra Heck  Encounter Date: 02/03/2016      OT End of Session - 02/03/16 0857    Visit Number 1   Number of Visits 8   Date for OT Re-Evaluation 03/02/16   OT Start Time 0803   OT Stop Time 0851   OT Time Calculation (min) 48 min   Activity Tolerance Patient tolerated treatment well   Behavior During Therapy [facility] for tasks assessed/performed      Past Medical History  Diagnosis Date  . Dysrhythmia     atrial fibrillation  . Anemia   . Headache   . Hyperlipidemia   . Kidney stones   . Prostatitis     Past Surgical History  Procedure Laterality Date  . Cardiac catheterization    . Dupuytren contracture release Bilateral   . Green light laser turp (transurethral resection of prostate N/A 02/15/2015    Procedure: GREEN LIGHT LASER TURP (TRANSURETHRAL RESECTION OF PROSTATE;  Surgeon: Royston Cowper, MD;  Location: [facility] ORS;  Service: Urology;  Laterality: N/A;  . Cataract extraction, bilateral    . Tonsillectomy    . Appendectomy    . Colonoscopy      1998, 2003, 2011  . Flexible sigmoidoscopy      1993  . Dupuytren contracture release Right 01/11/2016    Procedure: DUPUYTREN CONTRACTURE RELEASE;  Surgeon: Earnestine Leys, MD;  Location: [facility] ORS;  Service: Orthopedics;  Laterality: Right;    There were no vitals filed for this visit.      Subjective Assessment - 02/03/16 0851    Subjective  I had about 3 wks ago surgery - stitches got out by itself- had my other hand and other fingers done in past - but this hand the fingers wants to go sideways - did not really use this hand to much    Patient Stated Goals Want to get the scar better, fingers more straight that my fingers will stay  straight more - that I can do my roses , yard work, and play golf    Currently in Pain? No/denies            [facility] OT Assessment - 02/03/16 0001    Assessment   Diagnosis R Dupuytrens release   Referring Provider Sabra Heck   Onset Date 01/11/16   Home  Environment   Lives With Spouse   Prior Function   Vocation Retired   Leisure R hand dominant,  grow roses,  play golf and help wth house work    Strength   Right Hand Grip (lbs) 36   Right Hand Lateral Pinch 13 lbs   Right Hand 3 Point Pinch 12 lbs   Left Hand Grip (lbs) 50   Left Hand Lateral Pinch 12 lbs   Left Hand 3 Point Pinch 14 lbs   Right Hand AROM   R Index  MCP 0-90 -50 Degrees   R Index PIP 0-100 -10 Degrees   R Long  MCP 0-90 -50 Degrees   R Long PIP 0-100 -15 Degrees   R Ring  MCP 0-90 -30 Degrees   R Ring PIP 0-100 -30 Degrees   R Little  MCP 0-90 -10 Degrees   R Little PIP 0-100 0 Degrees  Parafin done 10 min to R hand prior to review of HEP for scar management - including scar pad  And ROM for PROM and AROM for digits extentio nand flexoin                   OT Education - 02/03/16 0857    Education provided Yes   Education Details HEP , scar management   Person(s) Educated Patient   Methods Explanation;Demonstration;Tactile cues;Verbal cues;Handout   Comprehension Verbal cues required;Returned demonstration;Verbalized understanding          OT Short Term Goals - 02/03/16 1443    OT SHORT TERM GOAL #1   Title Pt to be ind in HEP to increase and maitain extention of digits at [facility]  and  PIP 's to put hand in pocket    Baseline Mc's -50 to -10 '; PIP's -10 to -30    Time 2   Period Weeks   Status New   OT SHORT TERM GOAL #2   Title Pt to be ind in use of dorsal hand base splint to wear at night time to maintain extention of digits and anti ulnar deviation splint   Baseline prefab splint not able to wear because of pain   Time 2   Period Weeks   Status New            OT Long Term Goals - 02/03/16 1445    OT LONG TERM GOAL #1   Title Scar tissue improve and desentitize for pt to use tools , and work in yard without irritation and  pain    Baseline scar tissue still peeling skin and hyper throphic   Time 4   Period Weeks   Status New   OT LONG TERM GOAL #2   Title R grip strenght improve with 5-8 lbs to use tools and house work    Baseline 36 R , L 50 lbs    Time 4   Period Weeks   Status New               Plan - 02/03/16 [id number]    Clinical Impression Statement Pt present at eval 3 wks out of s/p for dupuytrens release in R hand - 4th digit - pt had in past  same surgery done - pt do show on R hand subluxation at [facility] for 2nd and 3rd - increase scar tissue - but no open areas - decrease extention of digits  - and functional use - pt can benefit from OT services - will assess next session if he needs custom extention splint for night time - cannot toleratet prefab provided by MD    Rehab Potential Good   OT Frequency 2x / week   OT Duration 4 weeks   OT Treatment/Interventions Self-care/ADL training;Parrafin;Ultrasound;Splinting;Patient/family education;Therapeutic exercises;Scar mobilization;Passive range of motion;Manual Therapy   Plan assess need for custome extention splint for night time    OT Home Exercise Plan see pt instruction    Consulted and Agree with Plan of Care Patient      Patient will benefit from skilled therapeutic intervention in order to improve the following deficits and impairments:  Decreased range of motion, Impaired flexibility, Decreased skin integrity, Impaired UE functional use, Decreased scar mobility, Decreased strength  Visit Diagnosis: Scar condition and fibrosis of skin - Plan: Ot plan of care cert/re-cert  Stiffness of right hand, not elsewhere classified - Plan: Ot plan of care cert/re-cert  Muscle weakness (generalized) - Plan: Ot plan  of care cert/re-cert      G-Codes - [id number]    Functional  Assessment Tool Used PRWHE , ROM , scar tissue, strenght and clinical judgement    Functional Limitation Self care   Self Care Current Status [id number]) At least 1 percent but less than 20 percent impaired, limited or restricted   Self Care Goal Status [id number]) 0 percent impaired, limited or restricted      Problem List There are no active problems to display for this patient.   Rosalyn Gess OTR/l,CLT  02/03/2016, 2:52 PM  [place] [facility] [address] [place], [address] Phone: [phone]   Fax:  [phone]  Name: Ryan Pace MRN: [id number] Date of Birth: [birth year]

## 2016-02-06 DIAGNOSIS — I482 Chronic atrial fibrillation: Secondary | ICD-10-CM | POA: Diagnosis not present

## 2016-02-09 ENCOUNTER — Ambulatory Visit: Payer: Commercial Managed Care - HMO | Admitting: Occupational Therapy

## 2016-02-09 DIAGNOSIS — L905 Scar conditions and fibrosis of skin: Secondary | ICD-10-CM

## 2016-02-09 DIAGNOSIS — M25641 Stiffness of right hand, not elsewhere classified: Secondary | ICD-10-CM

## 2016-02-09 DIAGNOSIS — M6281 Muscle weakness (generalized): Secondary | ICD-10-CM | POA: Diagnosis not present

## 2016-02-09 NOTE — Therapy (Signed)
[facility] [facility] [address], [place], [address] Phone: [phone]   Fax:  [phone]  Occupational Therapy Treatment  Patient Details  Name: Ryan Pace MRN: [id number] Date of Birth: 1929/06/16 Referring Provider: Sabra Heck  Encounter Date: 02/09/2016      OT End of Session - 02/09/16 1134    Visit Number 2   Number of Visits 8   Date for OT Re-Evaluation 03/02/16   OT Start Time 1045   OT Stop Time 1124   OT Time Calculation (min) 39 min   Activity Tolerance Patient tolerated treatment well   Behavior During Therapy [facility] for tasks assessed/performed      Past Medical History  Diagnosis Date  . Dysrhythmia     atrial fibrillation  . Anemia   . Headache   . Hyperlipidemia   . Kidney stones   . Prostatitis     Past Surgical History  Procedure Laterality Date  . Cardiac catheterization    . Dupuytren contracture release Bilateral   . Green light laser turp (transurethral resection of prostate N/A 02/15/2015    Procedure: GREEN LIGHT LASER TURP (TRANSURETHRAL RESECTION OF PROSTATE;  Surgeon: Royston Cowper, MD;  Location: [facility] ORS;  Service: Urology;  Laterality: N/A;  . Cataract extraction, bilateral    . Tonsillectomy    . Appendectomy    . Colonoscopy      1998, 2003, 2011  . Flexible sigmoidoscopy      1993  . Dupuytren contracture release Right 01/11/2016    Procedure: DUPUYTREN CONTRACTURE RELEASE;  Surgeon: Earnestine Leys, MD;  Location: [facility] ORS;  Service: Orthopedics;  Laterality: Right;    There were no vitals filed for this visit.      Subjective Assessment - 02/09/16 1130    Subjective  Doing okay - little bit of tenderness - but not bad - no pain and using it like before - what did you tell be about my fingers going sideways   Patient Stated Goals Want to get the scar better, fingers more straight that my fingers will stay straight more - that I can do my roses , yard work, and  play golf    Currently in Pain? No/denies            [facility] OT Assessment - 02/09/16 0001    Strength   Right Hand Grip (lbs) 48   Right Hand AROM   R Index  MCP 0-90 -60 Degrees   R Long  MCP 0-90 40 Degrees   R Ring  MCP 0-90 -30 Degrees  -30   R Little  MCP 0-90 -10 Degrees                  OT Treatments/Exercises (OP) - 02/09/16 0001    RUE Paraffin   Number Minutes Paraffin 10 Minutes   RUE Paraffin Location Hand   Comments At [facility] to decrease scar tissue and  increase extention of digits  - 2nd MP improve with 12 degrees    Grip strength increase by 12 lbs  See flowsheet   Scar massage for palmar scar- using  Coban for tractions - focus on z corners - at 2nd and 3rd digits - volar scar  As well as issue cica scar pad for 2nd digit volar scar - cont with 3rd digit Adhesions at 2nd [facility] and 3rd MC and palmar z scar  PROM for digits extention - showed increase MC extention by 12  degrees  Rubber band for digits extention after PROM Pt do have ulnar deviation of digits out of MC ? Subluxation at [facility] or long periods of flexion contractures            OT Education - 02/09/16 1133    Education provided Yes   Education Details HEP update   Person(s) Educated Patient   Methods Explanation;Demonstration;Tactile cues;Verbal cues;Handout   Comprehension Returned demonstration;Verbal cues required;Verbalized understanding          OT Short Term Goals - 02/03/16 1443    OT SHORT TERM GOAL #1   Title Pt to be ind in HEP to increase and maitain extention of digits at [facility]  and  PIP 's to put hand in pocket    Baseline Mc's -50 to -10 '; PIP's -10 to -30    Time 2   Period Weeks   Status New   OT SHORT TERM GOAL #2   Title Pt to be ind in use of dorsal hand base splint to wear at night time to maintain extention of digits and anti ulnar deviation splint   Baseline prefab splint not able to wear because of pain   Time 2   Period Weeks   Status New            OT Long Term Goals - 02/03/16 1445    OT LONG TERM GOAL #1   Title Scar tissue improve and desentitize for pt to use tools , and work in yard without irritation and  pain    Baseline scar tissue still peeling skin and hyper throphic   Time 4   Period Weeks   Status New   OT LONG TERM GOAL #2   Title R grip strenght improve with 5-8 lbs to use tools and house work    Baseline 36 R , L 50 lbs    Time 4   Period Weeks   Status New               Plan - 02/09/16 1134    Clinical Impression Statement (p) Pt grip strength increase by 12 lbs compare to last week - show increase or same extention except at 2nd [facility]- did not ed pt on scar massage for that scar or scar pad -  focus on that scar at volar MC  this date and issue another cica scar pad - to use for this scar - cont with prevoius one  for 3rd digit in palm - cont to have no pain    Rehab Potential (p) Good   OT Frequency (p) 1x / week   OT Treatment/Interventions (p) Self-care/ADL training;Parrafin;Ultrasound;Splinting;Patient/family education;Therapeutic exercises;Scar mobilization;Passive range of motion;Manual Therapy   OT Home Exercise Plan (p) see pt instruction    Consulted and Agree with Plan of Care (p) Patient      Patient will benefit from skilled therapeutic intervention in order to improve the following deficits and impairments:     Visit Diagnosis: Scar condition and fibrosis of skin  Stiffness of right hand, not elsewhere classified  Muscle weakness (generalized)    Problem List There are no active problems to display for this patient.   Rosalyn Gess OTR/L,CLT  02/09/2016, 12:15 PM  [place] [facility] [address] [place], [address] Phone: [phone]   Fax:  [phone]  Name: Ryan Pace MRN: [id number] Date of Birth: [birth year]

## 2016-02-09 NOTE — Patient Instructions (Signed)
Add Scar massage focus on 2nd digit volar scar and in webspace and palm  As well as scar pad for scar on in line with 2nd digit in palm  And rubber band for digits  extention after parafin

## 2016-02-22 ENCOUNTER — Ambulatory Visit: Payer: Commercial Managed Care - HMO | Admitting: Occupational Therapy

## 2016-04-03 DIAGNOSIS — I482 Chronic atrial fibrillation: Secondary | ICD-10-CM | POA: Diagnosis not present

## 2016-04-09 DIAGNOSIS — R3914 Feeling of incomplete bladder emptying: Secondary | ICD-10-CM | POA: Diagnosis not present

## 2016-04-09 DIAGNOSIS — R350 Frequency of micturition: Secondary | ICD-10-CM | POA: Diagnosis not present

## 2016-04-09 DIAGNOSIS — N401 Enlarged prostate with lower urinary tract symptoms: Secondary | ICD-10-CM | POA: Diagnosis not present

## 2016-04-09 DIAGNOSIS — R351 Nocturia: Secondary | ICD-10-CM | POA: Diagnosis not present

## 2016-05-03 DIAGNOSIS — R350 Frequency of micturition: Secondary | ICD-10-CM | POA: Diagnosis not present

## 2016-05-03 DIAGNOSIS — N401 Enlarged prostate with lower urinary tract symptoms: Secondary | ICD-10-CM | POA: Diagnosis not present

## 2016-05-03 DIAGNOSIS — R351 Nocturia: Secondary | ICD-10-CM | POA: Diagnosis not present

## 2016-05-10 DIAGNOSIS — I10 Essential (primary) hypertension: Secondary | ICD-10-CM | POA: Diagnosis not present

## 2016-05-10 DIAGNOSIS — E782 Mixed hyperlipidemia: Secondary | ICD-10-CM | POA: Diagnosis not present

## 2016-05-10 DIAGNOSIS — Z0000 Encounter for general adult medical examination without abnormal findings: Secondary | ICD-10-CM | POA: Diagnosis not present

## 2016-05-10 DIAGNOSIS — I2510 Atherosclerotic heart disease of native coronary artery without angina pectoris: Secondary | ICD-10-CM | POA: Diagnosis not present

## 2016-05-10 DIAGNOSIS — I480 Paroxysmal atrial fibrillation: Secondary | ICD-10-CM | POA: Diagnosis not present

## 2016-05-17 DIAGNOSIS — M542 Cervicalgia: Secondary | ICD-10-CM | POA: Diagnosis not present

## 2016-05-17 DIAGNOSIS — Z0000 Encounter for general adult medical examination without abnormal findings: Secondary | ICD-10-CM | POA: Diagnosis not present

## 2016-05-17 DIAGNOSIS — G43009 Migraine without aura, not intractable, without status migrainosus: Secondary | ICD-10-CM | POA: Diagnosis not present

## 2016-05-17 DIAGNOSIS — I2510 Atherosclerotic heart disease of native coronary artery without angina pectoris: Secondary | ICD-10-CM | POA: Diagnosis not present

## 2016-05-17 DIAGNOSIS — I480 Paroxysmal atrial fibrillation: Secondary | ICD-10-CM | POA: Diagnosis not present

## 2016-05-17 DIAGNOSIS — I340 Nonrheumatic mitral (valve) insufficiency: Secondary | ICD-10-CM | POA: Diagnosis not present

## 2016-05-17 DIAGNOSIS — Z23 Encounter for immunization: Secondary | ICD-10-CM | POA: Diagnosis not present

## 2016-05-17 DIAGNOSIS — I10 Essential (primary) hypertension: Secondary | ICD-10-CM | POA: Diagnosis not present

## 2016-05-17 DIAGNOSIS — E782 Mixed hyperlipidemia: Secondary | ICD-10-CM | POA: Diagnosis not present

## 2016-05-17 DIAGNOSIS — G4700 Insomnia, unspecified: Secondary | ICD-10-CM | POA: Diagnosis not present

## 2016-05-28 DIAGNOSIS — I10 Essential (primary) hypertension: Secondary | ICD-10-CM | POA: Diagnosis not present

## 2016-05-28 DIAGNOSIS — E782 Mixed hyperlipidemia: Secondary | ICD-10-CM | POA: Diagnosis not present

## 2016-05-28 DIAGNOSIS — I480 Paroxysmal atrial fibrillation: Secondary | ICD-10-CM | POA: Diagnosis not present

## 2016-05-28 DIAGNOSIS — I25118 Atherosclerotic heart disease of native coronary artery with other forms of angina pectoris: Secondary | ICD-10-CM | POA: Diagnosis not present

## 2016-05-28 DIAGNOSIS — I482 Chronic atrial fibrillation: Secondary | ICD-10-CM | POA: Diagnosis not present

## 2016-06-06 DIAGNOSIS — I480 Paroxysmal atrial fibrillation: Secondary | ICD-10-CM | POA: Diagnosis not present

## 2016-06-12 DIAGNOSIS — I25118 Atherosclerotic heart disease of native coronary artery with other forms of angina pectoris: Secondary | ICD-10-CM | POA: Diagnosis not present

## 2016-06-19 DIAGNOSIS — I2510 Atherosclerotic heart disease of native coronary artery without angina pectoris: Secondary | ICD-10-CM | POA: Diagnosis not present

## 2016-06-19 DIAGNOSIS — E782 Mixed hyperlipidemia: Secondary | ICD-10-CM | POA: Diagnosis not present

## 2016-06-19 DIAGNOSIS — I480 Paroxysmal atrial fibrillation: Secondary | ICD-10-CM | POA: Diagnosis not present

## 2016-06-19 DIAGNOSIS — I10 Essential (primary) hypertension: Secondary | ICD-10-CM | POA: Diagnosis not present

## 2016-06-26 DIAGNOSIS — L720 Epidermal cyst: Secondary | ICD-10-CM | POA: Diagnosis not present

## 2016-07-24 DIAGNOSIS — I482 Chronic atrial fibrillation: Secondary | ICD-10-CM | POA: Diagnosis not present

## 2016-08-15 DIAGNOSIS — H35373 Puckering of macula, bilateral: Secondary | ICD-10-CM | POA: Diagnosis not present

## 2016-09-19 DIAGNOSIS — I482 Chronic atrial fibrillation: Secondary | ICD-10-CM | POA: Diagnosis not present

## 2016-11-06 DIAGNOSIS — I10 Essential (primary) hypertension: Secondary | ICD-10-CM | POA: Diagnosis not present

## 2016-11-06 DIAGNOSIS — G43009 Migraine without aura, not intractable, without status migrainosus: Secondary | ICD-10-CM | POA: Diagnosis not present

## 2016-11-06 DIAGNOSIS — E782 Mixed hyperlipidemia: Secondary | ICD-10-CM | POA: Diagnosis not present

## 2016-11-06 DIAGNOSIS — I2510 Atherosclerotic heart disease of native coronary artery without angina pectoris: Secondary | ICD-10-CM | POA: Diagnosis not present

## 2016-11-06 DIAGNOSIS — I482 Chronic atrial fibrillation: Secondary | ICD-10-CM | POA: Diagnosis not present

## 2016-11-06 DIAGNOSIS — G4700 Insomnia, unspecified: Secondary | ICD-10-CM | POA: Diagnosis not present

## 2016-11-06 DIAGNOSIS — Z125 Encounter for screening for malignant neoplasm of prostate: Secondary | ICD-10-CM | POA: Diagnosis not present

## 2016-11-06 DIAGNOSIS — I480 Paroxysmal atrial fibrillation: Secondary | ICD-10-CM | POA: Diagnosis not present

## 2016-11-06 DIAGNOSIS — I340 Nonrheumatic mitral (valve) insufficiency: Secondary | ICD-10-CM | POA: Diagnosis not present

## 2016-11-06 DIAGNOSIS — M542 Cervicalgia: Secondary | ICD-10-CM | POA: Diagnosis not present

## 2016-11-13 DIAGNOSIS — G43009 Migraine without aura, not intractable, without status migrainosus: Secondary | ICD-10-CM | POA: Diagnosis not present

## 2016-11-13 DIAGNOSIS — Z0000 Encounter for general adult medical examination without abnormal findings: Secondary | ICD-10-CM | POA: Diagnosis not present

## 2016-11-13 DIAGNOSIS — G4700 Insomnia, unspecified: Secondary | ICD-10-CM | POA: Diagnosis not present

## 2016-11-13 DIAGNOSIS — I2510 Atherosclerotic heart disease of native coronary artery without angina pectoris: Secondary | ICD-10-CM | POA: Diagnosis not present

## 2016-11-13 DIAGNOSIS — I480 Paroxysmal atrial fibrillation: Secondary | ICD-10-CM | POA: Diagnosis not present

## 2016-11-13 DIAGNOSIS — I10 Essential (primary) hypertension: Secondary | ICD-10-CM | POA: Diagnosis not present

## 2016-11-26 DIAGNOSIS — J208 Acute bronchitis due to other specified organisms: Secondary | ICD-10-CM | POA: Diagnosis not present

## 2016-12-05 DIAGNOSIS — I482 Chronic atrial fibrillation: Secondary | ICD-10-CM | POA: Diagnosis not present

## 2016-12-17 DIAGNOSIS — I2510 Atherosclerotic heart disease of native coronary artery without angina pectoris: Secondary | ICD-10-CM | POA: Diagnosis not present

## 2016-12-17 DIAGNOSIS — I872 Venous insufficiency (chronic) (peripheral): Secondary | ICD-10-CM | POA: Diagnosis not present

## 2016-12-17 DIAGNOSIS — I340 Nonrheumatic mitral (valve) insufficiency: Secondary | ICD-10-CM | POA: Diagnosis not present

## 2016-12-17 DIAGNOSIS — E782 Mixed hyperlipidemia: Secondary | ICD-10-CM | POA: Diagnosis not present

## 2016-12-17 DIAGNOSIS — I480 Paroxysmal atrial fibrillation: Secondary | ICD-10-CM | POA: Diagnosis not present

## 2016-12-17 DIAGNOSIS — I10 Essential (primary) hypertension: Secondary | ICD-10-CM | POA: Diagnosis not present

## 2017-02-05 DIAGNOSIS — I482 Chronic atrial fibrillation: Secondary | ICD-10-CM | POA: Diagnosis not present

## 2017-02-25 DIAGNOSIS — K130 Diseases of lips: Secondary | ICD-10-CM | POA: Diagnosis not present

## 2017-02-25 DIAGNOSIS — I10 Essential (primary) hypertension: Secondary | ICD-10-CM | POA: Diagnosis not present

## 2017-03-06 DIAGNOSIS — I482 Chronic atrial fibrillation: Secondary | ICD-10-CM | POA: Diagnosis not present

## 2017-03-14 DIAGNOSIS — H35373 Puckering of macula, bilateral: Secondary | ICD-10-CM | POA: Diagnosis not present

## 2017-03-21 DIAGNOSIS — I480 Paroxysmal atrial fibrillation: Secondary | ICD-10-CM | POA: Diagnosis not present

## 2017-03-21 DIAGNOSIS — K130 Diseases of lips: Secondary | ICD-10-CM | POA: Diagnosis not present

## 2017-03-21 DIAGNOSIS — L219 Seborrheic dermatitis, unspecified: Secondary | ICD-10-CM | POA: Diagnosis not present

## 2017-03-21 DIAGNOSIS — I2510 Atherosclerotic heart disease of native coronary artery without angina pectoris: Secondary | ICD-10-CM | POA: Diagnosis not present

## 2017-03-21 DIAGNOSIS — I340 Nonrheumatic mitral (valve) insufficiency: Secondary | ICD-10-CM | POA: Diagnosis not present

## 2017-03-21 DIAGNOSIS — E782 Mixed hyperlipidemia: Secondary | ICD-10-CM | POA: Diagnosis not present

## 2017-03-26 DIAGNOSIS — L309 Dermatitis, unspecified: Secondary | ICD-10-CM | POA: Diagnosis not present

## 2017-04-08 DIAGNOSIS — D485 Neoplasm of uncertain behavior of skin: Secondary | ICD-10-CM | POA: Diagnosis not present

## 2017-04-08 DIAGNOSIS — L570 Actinic keratosis: Secondary | ICD-10-CM | POA: Diagnosis not present

## 2017-04-08 DIAGNOSIS — L309 Dermatitis, unspecified: Secondary | ICD-10-CM | POA: Diagnosis not present

## 2017-04-08 DIAGNOSIS — L723 Sebaceous cyst: Secondary | ICD-10-CM | POA: Diagnosis not present

## 2017-04-15 DIAGNOSIS — L720 Epidermal cyst: Secondary | ICD-10-CM | POA: Diagnosis not present

## 2017-04-15 DIAGNOSIS — L578 Other skin changes due to chronic exposure to nonionizing radiation: Secondary | ICD-10-CM | POA: Diagnosis not present

## 2017-04-15 DIAGNOSIS — L219 Seborrheic dermatitis, unspecified: Secondary | ICD-10-CM | POA: Diagnosis not present

## 2017-05-01 DIAGNOSIS — I480 Paroxysmal atrial fibrillation: Secondary | ICD-10-CM | POA: Diagnosis not present

## 2017-05-14 DIAGNOSIS — L219 Seborrheic dermatitis, unspecified: Secondary | ICD-10-CM | POA: Diagnosis not present

## 2017-05-14 DIAGNOSIS — L720 Epidermal cyst: Secondary | ICD-10-CM | POA: Diagnosis not present

## 2017-05-14 DIAGNOSIS — L710 Perioral dermatitis: Secondary | ICD-10-CM | POA: Diagnosis not present

## 2017-05-16 DIAGNOSIS — I2510 Atherosclerotic heart disease of native coronary artery without angina pectoris: Secondary | ICD-10-CM | POA: Diagnosis not present

## 2017-05-16 DIAGNOSIS — Z5181 Encounter for therapeutic drug level monitoring: Secondary | ICD-10-CM | POA: Diagnosis not present

## 2017-05-16 DIAGNOSIS — L219 Seborrheic dermatitis, unspecified: Secondary | ICD-10-CM | POA: Diagnosis not present

## 2017-05-16 DIAGNOSIS — K130 Diseases of lips: Secondary | ICD-10-CM | POA: Diagnosis not present

## 2017-05-16 DIAGNOSIS — G4700 Insomnia, unspecified: Secondary | ICD-10-CM | POA: Diagnosis not present

## 2017-05-16 DIAGNOSIS — I480 Paroxysmal atrial fibrillation: Secondary | ICD-10-CM | POA: Diagnosis not present

## 2017-05-16 DIAGNOSIS — Z7901 Long term (current) use of anticoagulants: Secondary | ICD-10-CM | POA: Diagnosis not present

## 2017-05-16 DIAGNOSIS — Z79899 Other long term (current) drug therapy: Secondary | ICD-10-CM | POA: Diagnosis not present

## 2017-05-16 DIAGNOSIS — I10 Essential (primary) hypertension: Secondary | ICD-10-CM | POA: Diagnosis not present

## 2017-05-16 DIAGNOSIS — G43009 Migraine without aura, not intractable, without status migrainosus: Secondary | ICD-10-CM | POA: Diagnosis not present

## 2017-05-16 DIAGNOSIS — I340 Nonrheumatic mitral (valve) insufficiency: Secondary | ICD-10-CM | POA: Diagnosis not present

## 2017-05-17 DIAGNOSIS — I10 Essential (primary) hypertension: Secondary | ICD-10-CM | POA: Diagnosis not present

## 2017-05-17 DIAGNOSIS — I2510 Atherosclerotic heart disease of native coronary artery without angina pectoris: Secondary | ICD-10-CM | POA: Diagnosis not present

## 2017-05-17 DIAGNOSIS — I480 Paroxysmal atrial fibrillation: Secondary | ICD-10-CM | POA: Diagnosis not present

## 2017-05-17 DIAGNOSIS — I208 Other forms of angina pectoris: Secondary | ICD-10-CM | POA: Diagnosis not present

## 2017-05-21 DIAGNOSIS — L720 Epidermal cyst: Secondary | ICD-10-CM | POA: Diagnosis not present

## 2017-05-21 DIAGNOSIS — L219 Seborrheic dermatitis, unspecified: Secondary | ICD-10-CM | POA: Diagnosis not present

## 2017-05-23 DIAGNOSIS — Z0000 Encounter for general adult medical examination without abnormal findings: Secondary | ICD-10-CM | POA: Diagnosis not present

## 2017-05-23 DIAGNOSIS — Z23 Encounter for immunization: Secondary | ICD-10-CM | POA: Diagnosis not present

## 2017-05-23 DIAGNOSIS — I2510 Atherosclerotic heart disease of native coronary artery without angina pectoris: Secondary | ICD-10-CM | POA: Diagnosis not present

## 2017-05-23 DIAGNOSIS — I340 Nonrheumatic mitral (valve) insufficiency: Secondary | ICD-10-CM | POA: Diagnosis not present

## 2017-05-23 DIAGNOSIS — I10 Essential (primary) hypertension: Secondary | ICD-10-CM | POA: Diagnosis not present

## 2017-05-23 DIAGNOSIS — G4700 Insomnia, unspecified: Secondary | ICD-10-CM | POA: Diagnosis not present

## 2017-05-23 DIAGNOSIS — R5383 Other fatigue: Secondary | ICD-10-CM | POA: Diagnosis not present

## 2017-05-23 DIAGNOSIS — E782 Mixed hyperlipidemia: Secondary | ICD-10-CM | POA: Diagnosis not present

## 2017-05-23 DIAGNOSIS — I480 Paroxysmal atrial fibrillation: Secondary | ICD-10-CM | POA: Diagnosis not present

## 2017-05-30 DIAGNOSIS — I480 Paroxysmal atrial fibrillation: Secondary | ICD-10-CM | POA: Diagnosis not present

## 2017-06-03 DIAGNOSIS — I480 Paroxysmal atrial fibrillation: Secondary | ICD-10-CM | POA: Diagnosis not present

## 2017-06-03 DIAGNOSIS — I10 Essential (primary) hypertension: Secondary | ICD-10-CM | POA: Diagnosis not present

## 2017-06-03 DIAGNOSIS — I208 Other forms of angina pectoris: Secondary | ICD-10-CM | POA: Diagnosis not present

## 2017-06-03 DIAGNOSIS — I2510 Atherosclerotic heart disease of native coronary artery without angina pectoris: Secondary | ICD-10-CM | POA: Diagnosis not present

## 2017-06-20 DIAGNOSIS — I480 Paroxysmal atrial fibrillation: Secondary | ICD-10-CM | POA: Diagnosis not present

## 2017-07-22 DIAGNOSIS — I480 Paroxysmal atrial fibrillation: Secondary | ICD-10-CM | POA: Diagnosis not present

## 2017-08-22 DIAGNOSIS — I480 Paroxysmal atrial fibrillation: Secondary | ICD-10-CM | POA: Diagnosis not present

## 2017-09-09 DIAGNOSIS — I480 Paroxysmal atrial fibrillation: Secondary | ICD-10-CM | POA: Diagnosis not present

## 2017-10-02 DIAGNOSIS — E782 Mixed hyperlipidemia: Secondary | ICD-10-CM | POA: Diagnosis not present

## 2017-10-02 DIAGNOSIS — I480 Paroxysmal atrial fibrillation: Secondary | ICD-10-CM | POA: Diagnosis not present

## 2017-10-02 DIAGNOSIS — I10 Essential (primary) hypertension: Secondary | ICD-10-CM | POA: Diagnosis not present

## 2017-10-02 DIAGNOSIS — R0602 Shortness of breath: Secondary | ICD-10-CM | POA: Diagnosis not present

## 2017-10-02 DIAGNOSIS — I2510 Atherosclerotic heart disease of native coronary artery without angina pectoris: Secondary | ICD-10-CM | POA: Diagnosis not present

## 2017-10-30 DIAGNOSIS — E782 Mixed hyperlipidemia: Secondary | ICD-10-CM | POA: Diagnosis not present

## 2017-10-30 DIAGNOSIS — Z125 Encounter for screening for malignant neoplasm of prostate: Secondary | ICD-10-CM | POA: Diagnosis not present

## 2017-10-30 DIAGNOSIS — I340 Nonrheumatic mitral (valve) insufficiency: Secondary | ICD-10-CM | POA: Diagnosis not present

## 2017-10-30 DIAGNOSIS — I2510 Atherosclerotic heart disease of native coronary artery without angina pectoris: Secondary | ICD-10-CM | POA: Diagnosis not present

## 2017-10-30 DIAGNOSIS — G4700 Insomnia, unspecified: Secondary | ICD-10-CM | POA: Diagnosis not present

## 2017-10-30 DIAGNOSIS — I480 Paroxysmal atrial fibrillation: Secondary | ICD-10-CM | POA: Diagnosis not present

## 2017-10-30 DIAGNOSIS — I10 Essential (primary) hypertension: Secondary | ICD-10-CM | POA: Diagnosis not present

## 2017-11-14 DIAGNOSIS — I480 Paroxysmal atrial fibrillation: Secondary | ICD-10-CM | POA: Diagnosis not present

## 2017-11-14 DIAGNOSIS — I10 Essential (primary) hypertension: Secondary | ICD-10-CM | POA: Diagnosis not present

## 2017-11-14 DIAGNOSIS — I2510 Atherosclerotic heart disease of native coronary artery without angina pectoris: Secondary | ICD-10-CM | POA: Diagnosis not present

## 2017-11-14 DIAGNOSIS — I340 Nonrheumatic mitral (valve) insufficiency: Secondary | ICD-10-CM | POA: Diagnosis not present

## 2017-11-14 DIAGNOSIS — G4700 Insomnia, unspecified: Secondary | ICD-10-CM | POA: Diagnosis not present

## 2017-11-14 DIAGNOSIS — Z125 Encounter for screening for malignant neoplasm of prostate: Secondary | ICD-10-CM | POA: Diagnosis not present

## 2017-11-14 DIAGNOSIS — E782 Mixed hyperlipidemia: Secondary | ICD-10-CM | POA: Diagnosis not present

## 2017-11-21 DIAGNOSIS — I340 Nonrheumatic mitral (valve) insufficiency: Secondary | ICD-10-CM | POA: Diagnosis not present

## 2017-11-21 DIAGNOSIS — Z0000 Encounter for general adult medical examination without abnormal findings: Secondary | ICD-10-CM | POA: Diagnosis not present

## 2017-11-21 DIAGNOSIS — I10 Essential (primary) hypertension: Secondary | ICD-10-CM | POA: Diagnosis not present

## 2017-11-21 DIAGNOSIS — I480 Paroxysmal atrial fibrillation: Secondary | ICD-10-CM | POA: Diagnosis not present

## 2017-11-21 DIAGNOSIS — I2510 Atherosclerotic heart disease of native coronary artery without angina pectoris: Secondary | ICD-10-CM | POA: Diagnosis not present

## 2017-11-21 DIAGNOSIS — G4700 Insomnia, unspecified: Secondary | ICD-10-CM | POA: Diagnosis not present

## 2017-11-21 DIAGNOSIS — E782 Mixed hyperlipidemia: Secondary | ICD-10-CM | POA: Diagnosis not present

## 2017-11-27 DIAGNOSIS — I480 Paroxysmal atrial fibrillation: Secondary | ICD-10-CM | POA: Diagnosis not present

## 2017-12-25 DIAGNOSIS — I480 Paroxysmal atrial fibrillation: Secondary | ICD-10-CM | POA: Diagnosis not present

## 2017-12-31 DIAGNOSIS — I2510 Atherosclerotic heart disease of native coronary artery without angina pectoris: Secondary | ICD-10-CM | POA: Diagnosis not present

## 2017-12-31 DIAGNOSIS — I480 Paroxysmal atrial fibrillation: Secondary | ICD-10-CM | POA: Diagnosis not present

## 2017-12-31 DIAGNOSIS — E782 Mixed hyperlipidemia: Secondary | ICD-10-CM | POA: Diagnosis not present

## 2017-12-31 DIAGNOSIS — I10 Essential (primary) hypertension: Secondary | ICD-10-CM | POA: Diagnosis not present

## 2017-12-31 DIAGNOSIS — R0602 Shortness of breath: Secondary | ICD-10-CM | POA: Diagnosis not present

## 2017-12-31 DIAGNOSIS — R5383 Other fatigue: Secondary | ICD-10-CM | POA: Diagnosis not present

## 2018-01-27 DIAGNOSIS — I480 Paroxysmal atrial fibrillation: Secondary | ICD-10-CM | POA: Diagnosis not present

## 2018-02-28 DIAGNOSIS — I480 Paroxysmal atrial fibrillation: Secondary | ICD-10-CM | POA: Diagnosis not present

## 2018-03-31 DIAGNOSIS — Z7901 Long term (current) use of anticoagulants: Secondary | ICD-10-CM | POA: Diagnosis not present

## 2018-04-08 DIAGNOSIS — I10 Essential (primary) hypertension: Secondary | ICD-10-CM | POA: Diagnosis not present

## 2018-04-08 DIAGNOSIS — R0602 Shortness of breath: Secondary | ICD-10-CM | POA: Diagnosis not present

## 2018-04-08 DIAGNOSIS — E782 Mixed hyperlipidemia: Secondary | ICD-10-CM | POA: Diagnosis not present

## 2018-04-08 DIAGNOSIS — I480 Paroxysmal atrial fibrillation: Secondary | ICD-10-CM | POA: Diagnosis not present

## 2018-04-08 DIAGNOSIS — I2510 Atherosclerotic heart disease of native coronary artery without angina pectoris: Secondary | ICD-10-CM | POA: Diagnosis not present

## 2018-04-22 DIAGNOSIS — H35373 Puckering of macula, bilateral: Secondary | ICD-10-CM | POA: Diagnosis not present

## 2018-04-30 DIAGNOSIS — Z7901 Long term (current) use of anticoagulants: Secondary | ICD-10-CM | POA: Diagnosis not present

## 2018-05-21 DIAGNOSIS — R0602 Shortness of breath: Secondary | ICD-10-CM | POA: Diagnosis not present

## 2018-05-21 DIAGNOSIS — I2510 Atherosclerotic heart disease of native coronary artery without angina pectoris: Secondary | ICD-10-CM | POA: Diagnosis not present

## 2018-05-21 DIAGNOSIS — G4700 Insomnia, unspecified: Secondary | ICD-10-CM | POA: Diagnosis not present

## 2018-05-21 DIAGNOSIS — I480 Paroxysmal atrial fibrillation: Secondary | ICD-10-CM | POA: Diagnosis not present

## 2018-05-21 DIAGNOSIS — I10 Essential (primary) hypertension: Secondary | ICD-10-CM | POA: Diagnosis not present

## 2018-05-21 DIAGNOSIS — E782 Mixed hyperlipidemia: Secondary | ICD-10-CM | POA: Diagnosis not present

## 2018-05-21 DIAGNOSIS — I340 Nonrheumatic mitral (valve) insufficiency: Secondary | ICD-10-CM | POA: Diagnosis not present

## 2018-05-27 DIAGNOSIS — R0602 Shortness of breath: Secondary | ICD-10-CM | POA: Diagnosis not present

## 2018-05-28 DIAGNOSIS — L219 Seborrheic dermatitis, unspecified: Secondary | ICD-10-CM | POA: Diagnosis not present

## 2018-05-28 DIAGNOSIS — I10 Essential (primary) hypertension: Secondary | ICD-10-CM | POA: Diagnosis not present

## 2018-05-28 DIAGNOSIS — I2510 Atherosclerotic heart disease of native coronary artery without angina pectoris: Secondary | ICD-10-CM | POA: Diagnosis not present

## 2018-05-28 DIAGNOSIS — R5383 Other fatigue: Secondary | ICD-10-CM | POA: Diagnosis not present

## 2018-05-28 DIAGNOSIS — G4700 Insomnia, unspecified: Secondary | ICD-10-CM | POA: Diagnosis not present

## 2018-05-28 DIAGNOSIS — E782 Mixed hyperlipidemia: Secondary | ICD-10-CM | POA: Diagnosis not present

## 2018-05-28 DIAGNOSIS — Z0000 Encounter for general adult medical examination without abnormal findings: Secondary | ICD-10-CM | POA: Diagnosis not present

## 2018-05-28 DIAGNOSIS — I480 Paroxysmal atrial fibrillation: Secondary | ICD-10-CM | POA: Diagnosis not present

## 2018-06-02 DIAGNOSIS — Z7901 Long term (current) use of anticoagulants: Secondary | ICD-10-CM | POA: Diagnosis not present

## 2018-06-05 ENCOUNTER — Emergency Department
Admission: EM | Admit: 2018-06-05 | Discharge: 2018-06-05 | Disposition: A | Discharge status: Home / Self Care | Payer: Medicare HMO | Attending: Emergency Medicine | Admitting: Emergency Medicine

## 2018-06-05 ENCOUNTER — Emergency Department: Payer: Medicare HMO

## 2018-06-05 DIAGNOSIS — M25462 Effusion, left knee: Secondary | ICD-10-CM

## 2018-06-05 DIAGNOSIS — M7989 Other specified soft tissue disorders: Secondary | ICD-10-CM | POA: Diagnosis not present

## 2018-06-05 DIAGNOSIS — I4891 Unspecified atrial fibrillation: Secondary | ICD-10-CM | POA: Insufficient documentation

## 2018-06-05 DIAGNOSIS — Z7901 Long term (current) use of anticoagulants: Secondary | ICD-10-CM | POA: Diagnosis not present

## 2018-06-05 DIAGNOSIS — Z79899 Other long term (current) drug therapy: Secondary | ICD-10-CM | POA: Diagnosis not present

## 2018-06-05 DIAGNOSIS — M25562 Pain in left knee: Secondary | ICD-10-CM | POA: Diagnosis not present

## 2018-06-05 LAB — CBC
HEMATOCRIT: 43.5 % (ref 39.0–52.0)
Hemoglobin: 14.1 g/dL (ref 13.0–17.0)
MCH: 29.7 pg (ref 26.0–34.0)
MCHC: 32.4 g/dL (ref 30.0–36.0)
MCV: 91.8 fL (ref 80.0–100.0)
Platelets: 162 10*3/uL (ref 150–400)
RBC: 4.74 MIL/uL (ref 4.22–5.81)
RDW: 12.9 % (ref 11.5–15.5)
WBC: 6.3 10*3/uL (ref 4.0–10.5)
nRBC: 0 % (ref 0.0–0.2)

## 2018-06-05 LAB — COMPREHENSIVE METABOLIC PANEL
ALK PHOS: 50 U/L (ref 38–126)
ALT: 14 U/L (ref 0–44)
ANION GAP: 9 (ref 5–15)
AST: 27 U/L (ref 15–41)
Albumin: 3.9 g/dL (ref 3.5–5.0)
BILIRUBIN TOTAL: 0.7 mg/dL (ref 0.3–1.2)
BUN: 35 mg/dL — AB (ref 8–23)
CALCIUM: 9.2 mg/dL (ref 8.9–10.3)
CO2: 28 mmol/L (ref 22–32)
Chloride: 102 mmol/L (ref 98–111)
Creatinine, Ser: 1.32 mg/dL — ABNORMAL HIGH (ref 0.61–1.24)
GFR calc Af Amer: 55 mL/min — ABNORMAL LOW (ref >60)
GFR, EST NON AFRICAN AMERICAN: 47 mL/min — AB (ref >60)
Glucose, Bld: 121 mg/dL — ABNORMAL HIGH (ref 70–99)
POTASSIUM: 4.3 mmol/L (ref 3.5–5.1)
Sodium: 139 mmol/L (ref 135–145)
TOTAL PROTEIN: 6.8 g/dL (ref 6.5–8.1)

## 2018-06-05 LAB — PROTIME-INR
INR: 2.67
PROTHROMBIN TIME: 28.2 s — AB (ref 11.4–15.2)

## 2018-06-05 MED ORDER — OXYCODONE HCL 5 MG PO TABS: 5.0000 mg | ORAL_TABLET | Freq: Four times a day (QID) | ORAL | 0 refills | Status: DC | PRN

## 2018-06-05 MED ORDER — LIDOCAINE HCL (PF) 1 % IJ SOLN
10.0000 mL | Freq: Once | INTRAMUSCULAR | Status: AC
  Administered 2018-06-05: 10 mL via INTRADERMAL
  Filled 2018-06-05: qty 10

## 2018-06-05 NOTE — ED Provider Notes (Signed)
[facility] Emergency Department Provider Note  ____________________________________________  Time seen: Approximately 3:28 PM  I have reviewed the triage vital signs and the nursing notes.   HISTORY  Chief Complaint Knee Pain    HPI Ryan Pace is a 84 y.o. male with a history of paroxysmal atrial fibrillation and hyperlipidemia who comes to the ED complaining of left knee pain and swelling that he first noticed this morning.  Denies any recent injury.  No leg pain.  No chest pain shortness of breath or fever.  Never had anything like this before.  He is on Coumadin for stroke prevention, INRs have been within the target therapeutic range on previous checks.  He has been doing exercise classes for physical therapy recently for chronic back pain.   Pain is constant, mild to moderate, worse with weightbearing and range of motion of the knee.  No alleviating factors.  Nonradiating.   Past Medical History:  Diagnosis Date  . Anemia   . Dysrhythmia    atrial fibrillation  . Headache   . Hyperlipidemia   . Kidney stones   . Prostatitis      There are no active problems to display for this patient.    Past Surgical History:  Procedure Laterality Date  . APPENDECTOMY    . CARDIAC CATHETERIZATION    . CATARACT EXTRACTION, BILATERAL    . COLONOSCOPY     1998, 2003, 2011  . DUPUYTREN CONTRACTURE RELEASE Bilateral   . DUPUYTREN CONTRACTURE RELEASE Right 01/11/2016   Procedure: DUPUYTREN CONTRACTURE RELEASE;  Surgeon: Earnestine Leys, MD;  Location: [facility] ORS;  Service: Orthopedics;  Laterality: Right;  . [place]  . GREEN LIGHT LASER TURP (TRANSURETHRAL RESECTION OF PROSTATE N/A 02/15/2015   Procedure: GREEN LIGHT LASER TURP (TRANSURETHRAL RESECTION OF PROSTATE;  Surgeon: Royston Cowper, MD;  Location: [facility] ORS;  Service: Urology;  Laterality: N/A;  . TONSILLECTOMY       Prior to Admission medications   Medication Sig Start  Date End Date Taking? Authorizing Provider  cetirizine (ZYRTEC) 5 MG tablet Take 5-10 mg by mouth daily as needed for allergies.     [provider]  Cyanocobalamin (VITAMIN B-12 PO) Take 1 tablet by mouth at bedtime.    [provider]  Doxepin HCl 6 MG TABS Take 1 tablet by mouth at bedtime.    [provider]  gabapentin (NEURONTIN) 400 MG capsule Take 1 capsule (400 mg total) by mouth 3 (three) times daily. 01/11/16   Earnestine Leys, MD  HYDROcodone-acetaminophen (NORCO) 5-325 MG tablet Take 1 tablet by mouth every 6 (six) hours as needed. 01/11/16   Earnestine Leys, MD  lisinopril (PRINIVIL,ZESTRIL) 20 MG tablet Take 20 mg by mouth at bedtime.    [provider]  Multiple Vitamins-Minerals (MULTIVITAMIN WITH MINERALS) tablet Take 1 tablet by mouth daily.    [provider]  pravastatin (PRAVACHOL) 20 MG tablet Take 20 mg by mouth at bedtime.     [provider]  SUMAtriptan (IMITREX) 50 MG tablet Take 50 mg by mouth as needed for migraine. May repeat in 2 hours if headache persists or recurs.    [provider]  temazepam (RESTORIL) 15 MG capsule Take 15 mg by mouth at bedtime as needed for sleep.    [provider]  warfarin (COUMADIN) 3 MG tablet Take 3 mg by mouth daily.    [provider]     Allergies Penicillins  No family history on file.  Social History Social History   Tobacco Use  . Smoking status: Never Smoker  . Smokeless tobacco: Former Network engineer Use Topics  . Alcohol use: No  . Drug use: No    Review of Systems  Constitutional:   No fever or chills.  ENT:   No sore throat. No rhinorrhea. Cardiovascular:   No chest pain or syncope. Respiratory:   No dyspnea or cough. Gastrointestinal:   Negative for abdominal pain, vomiting and diarrhea.  Musculoskeletal: Left knee pain as above All other systems reviewed and are negative except as documented above in ROS and  HPI.  ____________________________________________   PHYSICAL EXAM:  VITAL SIGNS: ED Triage Vitals  Enc Vitals Group     BP 06/05/18 1054 140/90     Pulse Rate 06/05/18 1054 98     Resp 06/05/18 1054 16     Temp 06/05/18 1054 98.1 F (36.7 C)     Temp Source 06/05/18 1054 Oral     SpO2 06/05/18 1054 100 %     Weight 06/05/18 1056 190 lb (86.2 kg)     Height --      Head Circumference --      Peak Flow --      Pain Score 06/05/18 1056 7     Pain Loc --      Pain Edu? --      Excl. in [place]? --     Vital signs reviewed, nursing assessments reviewed.   Constitutional:   Alert and oriented. Non-toxic appearance. Eyes:   Conjunctivae are normal. ENT       Neck:   No meningismus. Full ROM. Cardiovascular:   RRR. Symmetric bilateral radial and DP pulses.  No murmurs. Cap refill less than 2 seconds. Respiratory:   Normal respiratory effort without tachypnea/retractions. Breath sounds are clear and equal bilaterally. No wheezes/rales/rhonchi. Gastrointestinal:   Soft and nontender. Non distended. There is no CVA tenderness.  No rebound, rigidity, or guarding. Musculoskeletal:   Moderate left knee effusion.  Discomfort with range of motion, but able to tolerate without severe pain.  No inflammatory changes, joint is not hot to the touch.  No focal bony tenderness. Neurologic:   Normal speech and language.  Motor grossly intact. No acute focal neurologic deficits are appreciated.  Skin:    Skin is warm, dry and intact. No rash noted.  No inflammatory skin changes around the knee. No petechiae, purpura, or bullae.  ____________________________________________    LABS (pertinent positives/negatives) (all labs ordered are listed, but only abnormal results are displayed) Labs Reviewed  COMPREHENSIVE METABOLIC PANEL - Abnormal; Notable for the following components:      Result Value   Glucose, Bld 121 (*)    BUN 35 (*)    Creatinine, Ser 1.32 (*)    GFR calc non Af Amer 47 (*)     GFR calc Af Amer 55 (*)    All other components within normal limits  PROTIME-INR - Abnormal; Notable for the following components:   Prothrombin Time 28.2 (*)    All other components within normal limits  BODY FLUID CULTURE  CBC  GLUCOSE, BODY FLUID OTHER  PROTEIN, BODY FLUID (OTHER)  SYNOVIAL CELL COUNT + DIFF, W/ CRYSTALS  URIC ACID, BODY FLUID   ____________________________________________   EKG    ____________________________________________    RADIOLOGY  Dg Knee Complete 4 Views Left  Result Date: 06/05/2018 CLINICAL DATA:  Pain and swelling EXAM: LEFT KNEE - COMPLETE  4+ VIEW COMPARISON:  None. FINDINGS: Frontal, lateral, and bilateral oblique views were obtained. There is no fracture or dislocation. There is slight lateral patellar subluxation. There is a focal joint effusion. There is generalized joint space narrowing with spurring in all compartments. There is extensive chondrocalcinosis. IMPRESSION: No fracture or dislocation. There is mild lateral patellar subluxation. There is a joint effusion. 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 There is extensive chondrocalcinosis, a finding that may be seen with osteoarthritis or with calcium pyrophosphate deposition disease. Electronically Signed   By: Lowella Grip III M.D.   On: 06/05/2018 13:10    ____________________________________________   PROCEDURES .Joint Aspiration/Arthrocentesis Date/Time: 06/05/2018 3:34 PM Performed by: Carrie Mew, MD Authorized by: Carrie Mew, MD   Consent:    Consent obtained:  Written   Consent given by:  Patient   Risks discussed:  Bleeding, infection, pain and incomplete drainage   Alternatives discussed:  No treatment and referral Location:    Location:  Knee   Knee:  L knee Anesthesia (see MAR for exact dosages):    Anesthesia method:  None Procedure details:    Needle gauge:  18 G   Ultrasound guidance: no     Approach:  Lateral    Aspirate amount:  40   Aspirate characteristics:  Bloody   Steroid injected: no     Specimen collected: yes   Post-procedure details:    Dressing:  Adhesive bandage   Patient tolerance of procedure:  Tolerated well, no immediate complications Comments:     40 mL grossly bloody fluid.  4 mL preservative-free lidocaine injected into the joint for analgesia.  Effusion still present but diminished after the procedure.    ____________________________________________  DIFFERENTIAL DIAGNOSIS   Spontaneous bleeding in the joint, septic arthritis, gouty arthritis/pseudogout, traumatic arthritis  CLINICAL IMPRESSION / ASSESSMENT AND PLAN / ED COURSE  Pertinent labs & imaging results that were available during my care of the patient were reviewed by me and considered in my medical decision making (see chart for details).    Patient not in distress, presents with left knee effusion and pain.  Doubt septic arthritis.  INR is within target range.  X-ray is negative for fracture.  No significant trauma history.  Given lack of other explanatory findings, will complete arthrocentesis to rule out infection of the joint.  If negative patient can follow-up with orthopedics.  Clinical Course as of Jun 06 1527  Thu Jun 05, 2018  1502 Arthrocentesis completed, 62mL bloody fluid, likely spontaneous    [PS]    Clinical Course User Index [PS] Carrie Mew, MD     ____________________________________________   FINAL CLINICAL IMPRESSION(S) / ED DIAGNOSES    Final diagnoses:  Effusion of left knee  Warfarin anticoagulation     ED Discharge Orders    None      Portions of this note were generated with dragon dictation software. Dictation errors may occur despite best attempts at proofreading.    Carrie Mew, MD 06/05/18 1535

## 2018-06-05 NOTE — Discharge Instructions (Addendum)
Your left knee pain appears to be due to blood accumulation in the joint.  Please follow-up with orthopedics or your primary care doctor within the next few days for further evaluation and to determine if you should temporarily discontinue Coumadin to stop the bleeding in your knee. Elevate the leg, apply ice, use walker to prevent falls. Take 100mg  (2 extra strength) Tylenol every 8 hours and 5 mg of oxycodone every 4-6 hours as needed for breakthrough pain.  It is very important that you return to the emergency room if your knee becomes hot, red, if your pain becomes severe or if you develop a fever.

## 2018-06-05 NOTE — ED Provider Notes (Signed)
-----------------------------------------   4:43 PM on 06/05/2018 -----------------------------------------   Blood pressure (!) 152/89, pulse (!) 103, temperature 98.1 F (36.7 C), temperature source Oral, resp. rate 16, weight 86.2 kg, SpO2 100 %.  Assuming care from Dr. Joni Fears of [facility] is a 84 y.o. male with a chief complaint of Knee Pain .    Please refer to H&P by previous MD for further details.  The current plan of care is to f/u results of arthrocentesis fluid.   Unfortunately due to bloody fluid labs unable to do crystals, Gram stain, or cell count.  Fluid was sent for culture.  I reassess patient's knee which looks swollen but not erythematous or warm, patient has great range of motion of the knee with no signs of a septic joint at this time.  I will provide him with a short course of oxycodone for pain, a walker and a knee immobilizer.  Recommended elevation and ice and close follow-up with primary care doctor.  Discussed strict return precautions for redness, warmth or fever.  Patient and his wife are comfortable with this plan.   Rudene Re, MD 06/05/18 [phone]

## 2018-06-05 NOTE — ED Notes (Signed)
[place] with pt

## 2018-06-05 NOTE — ED Triage Notes (Signed)
Patient reports pain and swelling to his left knee since mid-morning.  Patient denies injury to knee.  Patient is in no obvious distress at this time.  Patient states he cannot bear weight on his left knee.

## 2018-06-06 LAB — GLUCOSE, BODY FLUID OTHER: Glucose, Body Fluid Other: 47 mg/dL

## 2018-06-06 LAB — URIC ACID, BODY FLUID: Uric Acid Body Fluid: 5.8 mg/dL

## 2018-06-06 LAB — PROTEIN, BODY FLUID (OTHER): Total Protein, Body Fluid Other: 6.5 g/dL

## 2018-06-07 ENCOUNTER — Emergency Department
Admission: EM | Admit: 2018-06-07 | Discharge: 2018-06-07 | Disposition: A | Discharge status: Home / Self Care | Payer: Medicare HMO | Attending: Emergency Medicine | Admitting: Emergency Medicine

## 2018-06-07 ENCOUNTER — Other Ambulatory Visit: Payer: Self-pay

## 2018-06-07 DIAGNOSIS — M25462 Effusion, left knee: Secondary | ICD-10-CM | POA: Diagnosis not present

## 2018-06-07 DIAGNOSIS — Z7902 Long term (current) use of antithrombotics/antiplatelets: Secondary | ICD-10-CM | POA: Insufficient documentation

## 2018-06-07 DIAGNOSIS — Z79899 Other long term (current) drug therapy: Secondary | ICD-10-CM | POA: Diagnosis not present

## 2018-06-07 DIAGNOSIS — Z7901 Long term (current) use of anticoagulants: Secondary | ICD-10-CM | POA: Diagnosis not present

## 2018-06-07 DIAGNOSIS — M25562 Pain in left knee: Secondary | ICD-10-CM | POA: Diagnosis not present

## 2018-06-07 MED ORDER — DICLOFENAC SODIUM 1 % TD GEL: 4.0000 g | Freq: Four times a day (QID) | TRANSDERMAL | 0 refills | Status: DC

## 2018-06-07 NOTE — Discharge Instructions (Addendum)
Please seek medical attention for any high fevers, chest pain, shortness of breath, change in behavior, persistent vomiting, bloody stool or any other new or concerning symptoms.  

## 2018-06-07 NOTE — ED Triage Notes (Signed)
Pt arrives from home via [facility] c/o L knee pain/swelling. Pt reports having knee aspirated 2 days ago states felt much better for 1 day, now swelling has returned.

## 2018-06-07 NOTE — ED Provider Notes (Signed)
[facility] Emergency Department Provider Note  ____________________________________________   I have reviewed the triage vital signs and the nursing notes.   HISTORY  Chief Complaint Left knee pain  History limited by: Not Limited   HPI Ryan Pace is a 84 y.o. male who presents to the emergency department today with continued left knee pain.  He was seen in the emergency department 2 days ago for this.  He states he is continued to have pain.  He was given a prescription for opioids he states he did not get it filled.  He is only been taking over-the-counter pain medication without great effect.  He has been trying to ice it and elevate it.  He states he has appointment with orthopedics scheduled for next week.   Per medical record review patient has a history of ER visit two days ago for left knee pain, x-rays showed concern for osteoarthritis or possible crystalline disease. Arthrocentesis was performed with culture showing no growth.  Past Medical History:  Diagnosis Date  . Anemia   . Dysrhythmia    atrial fibrillation  . Headache   . Hyperlipidemia   . Kidney stones   . Prostatitis     There are no active problems to display for this patient.   Past Surgical History:  Procedure Laterality Date  . APPENDECTOMY    . CARDIAC CATHETERIZATION    . CATARACT EXTRACTION, BILATERAL    . COLONOSCOPY     1998, 2003, 2011  . DUPUYTREN CONTRACTURE RELEASE Bilateral   . DUPUYTREN CONTRACTURE RELEASE Right 01/11/2016   Procedure: DUPUYTREN CONTRACTURE RELEASE;  Surgeon: Earnestine Leys, MD;  Location: [facility] ORS;  Service: Orthopedics;  Laterality: Right;  . [place]  . GREEN LIGHT LASER TURP (TRANSURETHRAL RESECTION OF PROSTATE N/A 02/15/2015   Procedure: GREEN LIGHT LASER TURP (TRANSURETHRAL RESECTION OF PROSTATE;  Surgeon: Royston Cowper, MD;  Location: [facility] ORS;  Service: Urology;  Laterality: N/A;  . TONSILLECTOMY       Prior to Admission medications   Medication Sig Start Date End Date Taking? Authorizing Provider  cetirizine (ZYRTEC) 5 MG tablet Take 5-10 mg by mouth daily as needed for allergies.     [provider]  Cyanocobalamin (VITAMIN B-12 PO) Take 1 tablet by mouth at bedtime.    [provider]  Doxepin HCl 6 MG TABS Take 1 tablet by mouth at bedtime.    [provider]  gabapentin (NEURONTIN) 400 MG capsule Take 1 capsule (400 mg total) by mouth 3 (three) times daily. 01/11/16   Earnestine Leys, MD  HYDROcodone-acetaminophen (NORCO) 5-325 MG tablet Take 1 tablet by mouth every 6 (six) hours as needed. 01/11/16   Earnestine Leys, MD  lisinopril (PRINIVIL,ZESTRIL) 20 MG tablet Take 20 mg by mouth at bedtime.    [provider]  Multiple Vitamins-Minerals (MULTIVITAMIN WITH MINERALS) tablet Take 1 tablet by mouth daily.    [provider]  oxyCODONE (ROXICODONE) 5 MG immediate release tablet Take 1 tablet (5 mg total) by mouth every 6 (six) hours as needed. 06/05/18 06/05/19  Rudene Re, MD  pravastatin (PRAVACHOL) 20 MG tablet Take 20 mg by mouth at bedtime.     [provider]  SUMAtriptan (IMITREX) 50 MG tablet Take 50 mg by mouth as needed for migraine. May repeat in 2 hours if headache persists or recurs.    [provider]  temazepam (RESTORIL) 15 MG capsule Take 15 mg by mouth at  bedtime as needed for sleep.    [provider]  warfarin (COUMADIN) 3 MG tablet Take 3 mg by mouth daily.    [provider]    Allergies Penicillins  No family history on file.  Social History Social History   Tobacco Use  . Smoking status: Never Smoker  . Smokeless tobacco: Former Network engineer Use Topics  . Alcohol use: No  . Drug use: No    Review of Systems Constitutional: No fever/chills Eyes: No visual changes. ENT: No sore throat. Cardiovascular: Denies chest pain. Respiratory: Denies shortness of  breath. Gastrointestinal: No abdominal pain.  No nausea, no vomiting.  No diarrhea.   Genitourinary: Negative for dysuria. Musculoskeletal: Positive for left knee pain Skin: Negative for rash. Neurological: Negative for headaches, focal weakness or numbness.  ____________________________________________   PHYSICAL EXAM:   Constitutional: Alert and oriented.  Eyes: Conjunctivae are normal.  ENT      Head: Normocephalic and atraumatic.      Nose: No congestion/rhinnorhea.      Mouth/Throat: Mucous membranes are moist.      Neck: No stridor. Cardiovascular: Normal rate, regular rhythm.  No murmurs, rubs, or gallops.  Respiratory: Normal respiratory effort without tachypnea nor retractions. Breath sounds are clear and equal bilaterally. No wheezes/rales/rhonchi. Gastrointestinal: Soft and non tender. No rebound. No guarding.  Genitourinary: Deferred Musculoskeletal: Left knee with moderate appearing effusion.  No erythema.  No warmth to the joint. Neurologic:  Normal speech and language. No gross focal neurologic deficits are appreciated.  Skin:  Skin is warm, dry and intact. No rash noted. Psychiatric: Mood and affect are normal. Speech and behavior are normal. Patient exhibits appropriate insight and judgment.  ____________________________________________    LABS (pertinent positives/negatives)  None  ____________________________________________   EKG  None  ____________________________________________    RADIOLOGY  None   ____________________________________________   PROCEDURES  Procedures  ____________________________________________   INITIAL IMPRESSION / ASSESSMENT AND PLAN / ED COURSE  Pertinent labs & imaging results that were available during my care of the patient were reviewed by me and considered in my medical decision making (see chart for details).   Patient presents to the emergency department with continued left knee pain.  Had  arthrocentesis performed 2 days ago which did not show any growth.  At this point no overlying erythema or warmth to suggest new infection.  Did discuss with patient that if he continues to have pain he can get his pain prescription filled.  Discussed importance of orthopedic follow-up.  This point I am somewhat hesitant to repeat arthrocentesis given concern for introducing infection.  Discussed this with the patient.  Will patient follow-up with orthopedics.  ____________________________________________   FINAL CLINICAL IMPRESSION(S) / ED DIAGNOSES  Final diagnoses:  Effusion of left knee     Note: This dictation was prepared with Dragon dictation. Any transcriptional errors that result from this process are unintentional     Nance Pear, MD 06/07/18 [phone]

## 2018-06-08 ENCOUNTER — Other Ambulatory Visit: Payer: Self-pay

## 2018-06-08 ENCOUNTER — Emergency Department: Payer: Medicare HMO

## 2018-06-08 ENCOUNTER — Encounter: Payer: Self-pay | Admitting: Emergency Medicine

## 2018-06-08 ENCOUNTER — Observation Stay
Admission: EM | Admit: 2018-06-08 | Discharge: 2018-06-09 | Disposition: A | Discharge status: Home / Self Care | Payer: Medicare HMO | Attending: Internal Medicine | Admitting: Internal Medicine

## 2018-06-08 DIAGNOSIS — I10 Essential (primary) hypertension: Secondary | ICD-10-CM | POA: Insufficient documentation

## 2018-06-08 DIAGNOSIS — T148XXA Other injury of unspecified body region, initial encounter: Secondary | ICD-10-CM | POA: Diagnosis present

## 2018-06-08 DIAGNOSIS — Z880 Allergy status to penicillin: Secondary | ICD-10-CM | POA: Insufficient documentation

## 2018-06-08 DIAGNOSIS — S7012XA Contusion of left thigh, initial encounter: Secondary | ICD-10-CM | POA: Diagnosis not present

## 2018-06-08 DIAGNOSIS — X58XXXA Exposure to other specified factors, initial encounter: Secondary | ICD-10-CM | POA: Insufficient documentation

## 2018-06-08 DIAGNOSIS — E785 Hyperlipidemia, unspecified: Secondary | ICD-10-CM | POA: Diagnosis not present

## 2018-06-08 DIAGNOSIS — D62 Acute posthemorrhagic anemia: Secondary | ICD-10-CM | POA: Diagnosis not present

## 2018-06-08 DIAGNOSIS — R531 Weakness: Secondary | ICD-10-CM | POA: Diagnosis not present

## 2018-06-08 DIAGNOSIS — Z79899 Other long term (current) drug therapy: Secondary | ICD-10-CM | POA: Diagnosis not present

## 2018-06-08 DIAGNOSIS — R58 Hemorrhage, not elsewhere classified: Secondary | ICD-10-CM

## 2018-06-08 DIAGNOSIS — I480 Paroxysmal atrial fibrillation: Secondary | ICD-10-CM | POA: Diagnosis not present

## 2018-06-08 DIAGNOSIS — Z87442 Personal history of urinary calculi: Secondary | ICD-10-CM | POA: Diagnosis not present

## 2018-06-08 DIAGNOSIS — Z7901 Long term (current) use of anticoagulants: Secondary | ICD-10-CM | POA: Diagnosis not present

## 2018-06-08 DIAGNOSIS — E860 Dehydration: Secondary | ICD-10-CM

## 2018-06-08 DIAGNOSIS — M25462 Effusion, left knee: Secondary | ICD-10-CM | POA: Diagnosis not present

## 2018-06-08 DIAGNOSIS — R233 Spontaneous ecchymoses: Secondary | ICD-10-CM | POA: Diagnosis not present

## 2018-06-08 LAB — URINALYSIS, COMPLETE (UACMP) WITH MICROSCOPIC
BACTERIA UA: NONE SEEN
BILIRUBIN URINE: NEGATIVE
Glucose, UA: NEGATIVE mg/dL
HGB URINE DIPSTICK: NEGATIVE
Ketones, ur: NEGATIVE mg/dL
Leukocytes, UA: NEGATIVE
Nitrite: NEGATIVE
PROTEIN: NEGATIVE mg/dL
SPECIFIC GRAVITY, URINE: 1.023 (ref 1.005–1.030)
pH: 5 (ref 5.0–8.0)

## 2018-06-08 LAB — COMPREHENSIVE METABOLIC PANEL
ALT: 19 U/L (ref 0–44)
AST: 29 U/L (ref 15–41)
Albumin: 3.7 g/dL (ref 3.5–5.0)
Alkaline Phosphatase: 48 U/L (ref 38–126)
Anion gap: 5 (ref 5–15)
BUN: 47 mg/dL — ABNORMAL HIGH (ref 8–23)
CO2: 29 mmol/L (ref 22–32)
Calcium: 8.7 mg/dL — ABNORMAL LOW (ref 8.9–10.3)
Chloride: 106 mmol/L (ref 98–111)
Creatinine, Ser: 1.64 mg/dL — ABNORMAL HIGH (ref 0.61–1.24)
GFR calc Af Amer: 42 mL/min — ABNORMAL LOW (ref >60)
GFR calc non Af Amer: 36 mL/min — ABNORMAL LOW (ref >60)
Glucose, Bld: 132 mg/dL — ABNORMAL HIGH (ref 70–99)
Potassium: 4.5 mmol/L (ref 3.5–5.1)
Sodium: 140 mmol/L (ref 135–145)
Total Bilirubin: 0.8 mg/dL (ref 0.3–1.2)
Total Protein: 6.2 g/dL — ABNORMAL LOW (ref 6.5–8.1)

## 2018-06-08 LAB — CBC
HEMATOCRIT: 33.2 % — AB (ref 39.0–52.0)
Hemoglobin: 10.8 g/dL — ABNORMAL LOW (ref 13.0–17.0)
MCH: 30.2 pg (ref 26.0–34.0)
MCHC: 32.5 g/dL (ref 30.0–36.0)
MCV: 92.7 fL (ref 80.0–100.0)
PLATELETS: 174 10*3/uL (ref 150–400)
RBC: 3.58 MIL/uL — AB (ref 4.22–5.81)
RDW: 13.7 % (ref 11.5–15.5)
WBC: 9.2 10*3/uL (ref 4.0–10.5)
nRBC: 0 % (ref 0.0–0.2)

## 2018-06-08 LAB — PROTIME-INR
INR: 3.22
PROTHROMBIN TIME: 32.7 s — AB (ref 11.4–15.2)

## 2018-06-08 LAB — HEMOGLOBIN AND HEMATOCRIT, BLOOD
HEMATOCRIT: 27 % — AB (ref 39.0–52.0)
HEMOGLOBIN: 8.7 g/dL — AB (ref 13.0–17.0)

## 2018-06-08 LAB — TROPONIN I

## 2018-06-08 MED ORDER — LORATADINE 10 MG PO TABS
10.0000 mg | ORAL_TABLET | Freq: Every day | ORAL | Status: DC
  Filled 2018-06-08 (×2): qty 1

## 2018-06-08 MED ORDER — ACETAMINOPHEN 650 MG RE SUPP: 650.0000 mg | Freq: Four times a day (QID) | RECTAL | Status: DC | PRN

## 2018-06-08 MED ORDER — POLYETHYLENE GLYCOL 3350 17 G PO PACK: 17.0000 g | PACK | Freq: Every day | ORAL | Status: DC | PRN

## 2018-06-08 MED ORDER — VITAMIN D 1000 UNITS PO TABS
5000.0000 [IU] | ORAL_TABLET | Freq: Every day | ORAL | Status: DC
  Administered 2018-06-09: 5000 [IU] via ORAL
  Filled 2018-06-08 (×2): qty 5

## 2018-06-08 MED ORDER — OXYCODONE HCL 5 MG PO TABS: 5.0000 mg | ORAL_TABLET | Freq: Four times a day (QID) | ORAL | Status: DC | PRN

## 2018-06-08 MED ORDER — ONDANSETRON HCL 4 MG PO TABS: 4.0000 mg | ORAL_TABLET | Freq: Four times a day (QID) | ORAL | Status: DC | PRN

## 2018-06-08 MED ORDER — TRAZODONE HCL 50 MG PO TABS
100.0000 mg | ORAL_TABLET | Freq: Every day | ORAL | Status: DC
  Administered 2018-06-08: 21:00:00 100 mg via ORAL
  Filled 2018-06-08: qty 2

## 2018-06-08 MED ORDER — PHYTONADIONE 5 MG PO TABS
5.0000 mg | ORAL_TABLET | Freq: Once | ORAL | Status: AC
  Administered 2018-06-08: 5 mg via ORAL
  Filled 2018-06-08 (×2): qty 1

## 2018-06-08 MED ORDER — FERROUS SULFATE 325 (65 FE) MG PO TABS
325.0000 mg | ORAL_TABLET | Freq: Every day | ORAL | Status: DC
  Administered 2018-06-09: 09:00:00 325 mg via ORAL
  Filled 2018-06-08 (×2): qty 1

## 2018-06-08 MED ORDER — ACETAMINOPHEN 325 MG PO TABS: 650.0000 mg | ORAL_TABLET | Freq: Four times a day (QID) | ORAL | Status: DC | PRN

## 2018-06-08 MED ORDER — VERAPAMIL HCL 80 MG PO TABS
80.0000 mg | ORAL_TABLET | Freq: Every day | ORAL | Status: DC
  Administered 2018-06-08 – 2018-06-09 (×2): 80 mg via ORAL
  Filled 2018-06-08 (×3): qty 1

## 2018-06-08 MED ORDER — SODIUM CHLORIDE 0.9 % IV BOLUS
500.0000 mL | Freq: Once | INTRAVENOUS | Status: AC
  Administered 2018-06-08: 500 mL via INTRAVENOUS

## 2018-06-08 MED ORDER — DOXEPIN HCL 10 MG PO CAPS
10.0000 mg | ORAL_CAPSULE | Freq: Every day | ORAL | Status: DC
  Filled 2018-06-08 (×2): qty 1

## 2018-06-08 MED ORDER — ONDANSETRON HCL 4 MG/2ML IJ SOLN: 4.0000 mg | Freq: Four times a day (QID) | INTRAMUSCULAR | Status: DC | PRN

## 2018-06-08 MED ORDER — PRAVASTATIN SODIUM 20 MG PO TABS
20.0000 mg | ORAL_TABLET | Freq: Every day | ORAL | Status: DC
  Filled 2018-06-08: qty 1

## 2018-06-08 NOTE — ED Triage Notes (Signed)
Was here couple days ago and had fluid drawn off knee that was all blood per pt but he didn't realize he had a bruise to left thigh.  Hematoma/bruise has more than doubled in size per pt. He has had generalized weakness and per family will almost give out from fatigue while walking. Is on coumadin.  No injuries.

## 2018-06-08 NOTE — Progress Notes (Signed)
atient admitted to 1C 123 with the diagnosis of hematoma. Alert and oriented x 4. Denies any acute pain at this time. Patient oriented to his room, ascom/call bell. Bell alarm activated and the bed is in the lowest position. Will continue to monitor.

## 2018-06-08 NOTE — H&P (Addendum)
[facility] NAME: Ryan Pace    MR#:  [id number]  DATE OF BIRTH:  1929/05/03  DATE OF ADMISSION:  06/08/2018  PRIMARY CARE PHYSICIAN: Tracie Harrier, MD   REQUESTING/REFERRING PHYSICIAN: Lavonia Drafts, MD  CHIEF COMPLAINT:   Chief Complaint  Patient presents with  . Weakness  . large hematoma    HISTORY OF PRESENT ILLNESS:  Ryan Pace  is a 84 y.o. male with a known history of paroxsymal a-fib on coumadin and HLD who presented to the ED with left thigh bruising that started last night. He has been seen in the ED 3 times in the last 3 days. He was initially seen 10/10 with left knee effusion. He had arthrocentesis performed with 37ml bloody fluid removed. Body fluid studies and culture were unremarkable. He returned to the ED on 10/12 with continued left knee pain. Repeat arthrocentesis was not performed. He was discharged home with ortho follow-up. He returns the ED today with continued left knee pain and left thigh bruising. The bruising was first noted last night. His family used a marker to draw around the bruise. This morning when they woke up, the bruising had significantly expanded, so they decided to come to the ED.  In the ED, his hemoglobin was noted to have dropped from 14.1 to 10.8. He endorses new fatigue and dyspnea on exertion. No palpitations or chest pain. Hospitalists were called for admission.  PAST MEDICAL HISTORY:   Past Medical History:  Diagnosis Date  . Anemia   . Dysrhythmia    atrial fibrillation  . Headache   . Hyperlipidemia   . Kidney stones   . Prostatitis     PAST SURGICAL HISTORY:   Past Surgical History:  Procedure Laterality Date  . APPENDECTOMY    . CARDIAC CATHETERIZATION    . CATARACT EXTRACTION, BILATERAL    . COLONOSCOPY     1998, 2003, 2011  . DUPUYTREN CONTRACTURE RELEASE Bilateral   . DUPUYTREN CONTRACTURE RELEASE Right 01/11/2016   Procedure: DUPUYTREN  CONTRACTURE RELEASE;  Surgeon: Earnestine Leys, MD;  Location: [facility] ORS;  Service: Orthopedics;  Laterality: Right;  . [place]  . GREEN LIGHT LASER TURP (TRANSURETHRAL RESECTION OF PROSTATE N/A 02/15/2015   Procedure: GREEN LIGHT LASER TURP (TRANSURETHRAL RESECTION OF PROSTATE;  Surgeon: Royston Cowper, MD;  Location: [facility] ORS;  Service: Urology;  Laterality: N/A;  . TONSILLECTOMY      SOCIAL HISTORY:   Social History   Tobacco Use  . Smoking status: Never Smoker  . Smokeless tobacco: Former Network engineer Use Topics  . Alcohol use: No    FAMILY HISTORY:  History reviewed. No pertinent family history.  DRUG ALLERGIES:   Allergies  Allergen Reactions  . Penicillins Rash    REVIEW OF SYSTEMS:   Review of Systems  Constitutional: Positive for malaise/fatigue. Negative for chills and fever.  HENT: Negative for congestion and sore throat.   Eyes: Negative for blurred vision and double vision.  Respiratory: Negative for cough and shortness of breath.   Cardiovascular: Positive for leg swelling. Negative for chest pain and palpitations.  Gastrointestinal: Negative for abdominal pain, blood in stool, nausea and vomiting.  Genitourinary: Negative for dysuria, frequency and hematuria.  Musculoskeletal: Positive for joint pain. Negative for back pain, falls and neck pain.  Neurological: Negative for dizziness, weakness and headaches.  Psychiatric/Behavioral: Negative for depression. The patient is not nervous/anxious.  MEDICATIONS AT HOME:   Prior to Admission medications   Medication Sig Start Date End Date Taking? Authorizing Provider  cetirizine (ZYRTEC) 5 MG tablet Take 5-10 mg by mouth daily as needed for allergies.    Yes [provider]  cholecalciferol (VITAMIN D) 1000 units tablet Take 5,000 Units by mouth daily.   Yes [provider]  Cyanocobalamin (VITAMIN B-12 PO) Take 1 tablet by mouth at bedtime.   Yes [provider]  diclofenac sodium (VOLTAREN) 1 % GEL Apply 4 g topically 4 (four) times daily. 06/07/18  Yes Nance Pear, MD  ferrous sulfate 325 (65 FE) MG EC tablet Take 325 mg by mouth daily.   Yes [provider]  furosemide (LASIX) 20 MG tablet Take 5 mg by mouth every morning.   Yes [provider]  lisinopril (PRINIVIL,ZESTRIL) 20 MG tablet Take 5 mg by mouth daily.    Yes [provider]  lisinopril (PRINIVIL,ZESTRIL) 5 MG tablet Take 5 mg by mouth daily.   Yes [provider]  oxyCODONE (ROXICODONE) 5 MG immediate release tablet Take 1 tablet (5 mg total) by mouth every 6 (six) hours as needed. 06/05/18 06/05/19 Yes Veronese, [place], MD  SUMAtriptan (IMITREX) 50 MG tablet Take 50 mg by mouth as needed for migraine. May repeat in 2 hours if headache persists or recurs.   Yes [provider]  verapamil (CALAN) 80 MG tablet Take 80 mg by mouth daily.   Yes [provider]  warfarin (COUMADIN) 3 MG tablet Take 3 mg by mouth daily.   Yes [provider]  Doxepin HCl 6 MG TABS Take 1 tablet by mouth at bedtime.    [provider]  gabapentin (NEURONTIN) 400 MG capsule Take 1 capsule (400 mg total) by mouth 3 (three) times daily. Patient not taking: Reported on 06/08/2018 01/11/16   Earnestine Leys, MD  HYDROcodone-acetaminophen [facility]) 5-325 MG tablet Take 1 tablet by mouth every 6 (six) hours as needed. Patient not taking: Reported on 06/08/2018 01/11/16   Earnestine Leys, MD  Multiple Vitamins-Minerals (MULTIVITAMIN WITH MINERALS) tablet Take 1 tablet by mouth daily.    [provider]  pravastatin (PRAVACHOL) 20 MG tablet Take 20 mg by mouth at bedtime.     [provider]  temazepam (RESTORIL) 15 MG capsule Take 15 mg by mouth at bedtime as needed for sleep.    [provider]      VITAL SIGNS:  Blood pressure 116/62, pulse 90, temperature 98.1 F (36.7 C), temperature source Oral, resp.  rate 17, height 5\' 11"  (1.803 m), weight 86.2 kg, SpO2 99 %.  PHYSICAL EXAMINATION:  Physical Exam  GENERAL:  84 y.o.-year-old patient lying in the bed with no acute distress.  EYES: Pupils equal, round, reactive to light and accommodation. No scleral icterus. Extraocular muscles intact.  HEENT: Head atraumatic, normocephalic. Oropharynx and nasopharynx clear. Moist mucous membranes. NECK:  Supple, no jugular venous distention. No thyroid enlargement, no tenderness.  LUNGS: Normal breath sounds bilaterally, no wheezing, rales,rhonchi or crepitation. No use of accessory muscles of respiration.  CARDIOVASCULAR: RRR, S1, S2 normal. No murmurs, rubs, or gallops.  ABDOMEN: Soft, nontender, nondistended. Bowel sounds present. No organomegaly or mass.  EXTREMITIES: No pedal edema, cyanosis, or clubbing. +ecchymosis present in the inner/posterior left thigh. Left knee is swollen, no warmth or erythema. +decreased ROM in the left knee due to pain.  NEUROLOGIC: Cranial nerves II through XII are intact. Muscle strength 5/5 in all extremities. Sensation intact.  Gait not checked.  PSYCHIATRIC: The patient is alert and oriented x 3.  SKIN: No obvious rash, lesion, or ulcer.   LABORATORY PANEL:   CBC Recent Labs  Lab 06/08/18 1503  WBC 9.2  HGB 10.8*  HCT 33.2*  PLT 174   ------------------------------------------------------------------------------------------------------------------  Chemistries  Recent Labs  Lab 06/08/18 1504  NA 140  K 4.5  CL 106  CO2 29  GLUCOSE 132*  BUN 47*  CREATININE 1.64*  CALCIUM 8.7*  AST 29  ALT 19  ALKPHOS 48  BILITOT 0.8   ------------------------------------------------------------------------------------------------------------------  Cardiac Enzymes Recent Labs  Lab 06/08/18 1503  TROPONINI <0.03   ------------------------------------------------------------------------------------------------------------------  RADIOLOGY:  Dg Chest  Portable 1 View  Result Date: 06/08/2018 CLINICAL DATA:  Weakness EXAM: PORTABLE CHEST 1 VIEW COMPARISON:  11/14/07 FINDINGS: Cardiac shadows within normal limits. The lungs are well aerated bilaterally. No focal infiltrate or sizable effusion is seen. No bony abnormality is noted. IMPRESSION: No acute abnormality noted. Electronically Signed   By: Inez Catalina M.D.   On: 06/08/2018 15:56      IMPRESSION AND PLAN:   Expanding left thigh hematoma with acute blood loss anemia- likely related to left knee arthrocentesis on 10/10. On coumadin with INR 3.3. - serial H/H - hold coumadin due to acute bleed - will give vitamin K 5mg  po x 1 - recheck INR in the morning  Acute blood loss anemia- due to above - serial H/H - continue home ferrous sulfate  Left knee effusion- s/p arthrocentesis 10/10 with 16ml bloody fluid removed. Unremarkable body fluid labs and culture. Effusion has persisted. - ortho consult for further management - tylenol and oxycodone prn for pain  Paroxysmal a-fib- in NSR here - hold coumadin due to acute bleed - continue verapamil  Elevated creatinine- does not meet criteria for AKI.Cr 1.64 (baseline ~1.3) - hold lisinopril and lasix for now - recheck bmp in the morning  Hyperglycemia- no diagnosis of diabetes - check a1c  Hypertension- normotensive here - hold lisinopril due to elevated creatinine  Hyperlipidemia- stable - continue home pravastatin  All the records are reviewed and case discussed with ED provider. Management plans discussed with the patient, family and they are in agreement.  CODE STATUS: Full  TOTAL TIME TAKING CARE OF THIS PATIENT: 45 minutes.    Berna Spare Rayshon Albaugh M.D on 06/08/2018 at 4:48 PM  Between 7am to 6pm - Pager - [phone]  After 6pm go to [email or web address] - Proofreader  [employer] [facility]  Hospitalists  Office  [phone]  CC: Primary care physician; Tracie Harrier, MD   Note: This dictation was  prepared with Dragon dictation along with smaller phrase technology. Any transcriptional errors that result from this process are unintentional.

## 2018-06-08 NOTE — ED Provider Notes (Signed)
[facility] Emergency Department Provider Note   ____________________________________________    I have reviewed the triage vital signs and the nursing notes.   HISTORY  Chief Complaint Weakness and large hematoma     HPI Ryan Pace is a 84 y.o. male who presents with complaints of weakness as well as a large bruise to his left thigh.  Patient has been seen twice over the last several days in the emergency department.  Initially seen on the 10th for left knee effusion, was tapped and found to have gross blood, culture has been negative x3 days.  Seen again for continued left knee pain, has orthopedic appointment next week.  Presents today because he noticed a large bruise to his left inner thigh this morning, is not sure how long it is been there.  Also states that he is much weaker than typical, he becomes fatigued very quickly with any exertion which is unusual for him.  He is on Coumadin for paroxysmal atrial fibrillation  Past Medical History:  Diagnosis Date  . Anemia   . Dysrhythmia    atrial fibrillation  . Headache   . Hyperlipidemia   . Kidney stones   . Prostatitis     There are no active problems to display for this patient.   Past Surgical History:  Procedure Laterality Date  . APPENDECTOMY    . CARDIAC CATHETERIZATION    . CATARACT EXTRACTION, BILATERAL    . COLONOSCOPY     1998, 2003, 2011  . DUPUYTREN CONTRACTURE RELEASE Bilateral   . DUPUYTREN CONTRACTURE RELEASE Right 01/11/2016   Procedure: DUPUYTREN CONTRACTURE RELEASE;  Surgeon: Earnestine Leys, MD;  Location: [facility] ORS;  Service: Orthopedics;  Laterality: Right;  . [place]  . GREEN LIGHT LASER TURP (TRANSURETHRAL RESECTION OF PROSTATE N/A 02/15/2015   Procedure: GREEN LIGHT LASER TURP (TRANSURETHRAL RESECTION OF PROSTATE;  Surgeon: Royston Cowper, MD;  Location: [facility] ORS;  Service: Urology;  Laterality: N/A;  . TONSILLECTOMY      Prior to  Admission medications   Medication Sig Start Date End Date Taking? Authorizing Provider  cetirizine (ZYRTEC) 5 MG tablet Take 5-10 mg by mouth daily as needed for allergies.    Yes [provider]  cholecalciferol (VITAMIN D) 1000 units tablet Take 5,000 Units by mouth daily.   Yes [provider]  Cyanocobalamin (VITAMIN B-12 PO) Take 1 tablet by mouth at bedtime.   Yes [provider]  diclofenac sodium (VOLTAREN) 1 % GEL Apply 4 g topically 4 (four) times daily. 06/07/18  Yes Nance Pear, MD  ferrous sulfate 325 (65 FE) MG EC tablet Take 325 mg by mouth daily.   Yes [provider]  furosemide (LASIX) 20 MG tablet Take 5 mg by mouth every morning.   Yes [provider]  lisinopril (PRINIVIL,ZESTRIL) 20 MG tablet Take 5 mg by mouth daily.    Yes [provider]  lisinopril (PRINIVIL,ZESTRIL) 5 MG tablet Take 5 mg by mouth daily.   Yes [provider]  oxyCODONE (ROXICODONE) 5 MG immediate release tablet Take 1 tablet (5 mg total) by mouth every 6 (six) hours as needed. 06/05/18 06/05/19 Yes Veronese, [place], MD  SUMAtriptan (IMITREX) 50 MG tablet Take 50 mg by mouth as needed for migraine. May repeat in 2 hours if headache persists or recurs.   Yes [provider]  verapamil (CALAN) 80 MG tablet Take 80 mg by mouth daily.   Yes  [provider]  warfarin (COUMADIN) 3 MG tablet Take 3 mg by mouth daily.   Yes [provider]  Doxepin HCl 6 MG TABS Take 1 tablet by mouth at bedtime.    [provider]  gabapentin (NEURONTIN) 400 MG capsule Take 1 capsule (400 mg total) by mouth 3 (three) times daily. Patient not taking: Reported on 06/08/2018 01/11/16   Earnestine Leys, MD  HYDROcodone-acetaminophen [facility]) 5-325 MG tablet Take 1 tablet by mouth every 6 (six) hours as needed. Patient not taking: Reported on 06/08/2018 01/11/16   Earnestine Leys, MD  Multiple Vitamins-Minerals (MULTIVITAMIN WITH  MINERALS) tablet Take 1 tablet by mouth daily.    [provider]  pravastatin (PRAVACHOL) 20 MG tablet Take 20 mg by mouth at bedtime.     [provider]  temazepam (RESTORIL) 15 MG capsule Take 15 mg by mouth at bedtime as needed for sleep.    [provider]     Allergies Penicillins  History reviewed. No pertinent family history.  Social History Social History   Tobacco Use  . Smoking status: Never Smoker  . Smokeless tobacco: Former Network engineer Use Topics  . Alcohol use: No  . Drug use: No    Review of Systems  Constitutional: No fevers, positive weakness Eyes: No visual changes.  ENT: No sore throat. Cardiovascular: Denies chest pain. Respiratory: Denies shortness of breath.  No cough Gastrointestinal: No abdominal pain.  No nausea, no vomiting.   Genitourinary: Negative for dysuria. Musculoskeletal: Left knee pain Skin: As above Neurological: Negative for headaches    ____________________________________________   PHYSICAL EXAM:  VITAL SIGNS: ED Triage Vitals [06/08/18 1450]  Enc Vitals Group     BP (!) 112/56     Pulse Rate (!) 105     Resp 18     Temp 98.1 F (36.7 C)     Temp Source Oral     SpO2 96 %     Weight 86.2 kg (190 lb 0.6 oz)     Height 1.803 m (5\' 11" )     Head Circumference      Peak Flow      Pain Score 7     Pain Loc      Pain Edu?      Excl. in [place]?     Constitutional: Alert and oriented.  Eyes: Conjunctivae are normal.   Nose: No congestion/rhinnorhea. Mouth/Throat: Mucous membranes are moist.    Cardiovascular: Normal rate, regular rhythm.  Good peripheral circulation. Respiratory: Normal respiratory effort.  No retractions. Lungs CTAB. Gastrointestinal: Soft and nontender. No distention.  No CVA tenderness.  Musculoskeletal: No lower extremity tenderness nor edema.  Warm and well perfused Neurologic:  Normal speech and language. No gross focal neurologic deficits are appreciated.  Skin:   Skin is warm, dry and intact.  Large ecchymosis left inner thigh which extends close to the medial knee, compartment is soft, 2+ distal pulses. Psychiatric: Mood and affect are normal. Speech and behavior are normal.  ____________________________________________   LABS (all labs ordered are listed, but only abnormal results are displayed)  Labs Reviewed  CBC - Abnormal; Notable for the following components:      Result Value   RBC 3.58 (*)    Hemoglobin 10.8 (*)    HCT 33.2 (*)    All other components within normal limits  PROTIME-INR - Abnormal; Notable for the following components:   Prothrombin Time 32.7 (*)    All other components within normal limits  COMPREHENSIVE METABOLIC PANEL - Abnormal; Notable for the following components:   Glucose, Bld 132 (*)    BUN 47 (*)    Creatinine, Ser 1.64 (*)    Calcium 8.7 (*)    Total Protein 6.2 (*)    GFR calc non Af Amer 36 (*)    GFR calc Af Amer 42 (*)    All other components within normal limits  TROPONIN I  URINALYSIS, COMPLETE (UACMP) WITH MICROSCOPIC  CBG MONITORING, ED   ____________________________________________  EKG  ED ECG REPORT I, Lavonia Drafts, the attending physician, personally viewed and interpreted this ECG.  Date: 06/08/2018  Rhythm: Sinus tachycardia QRS Axis: normal Intervals: normal ST/T Wave abnormalities: Nonspecific changes Narrative Interpretation: no evidence of acute ischemia  ____________________________________________  RADIOLOGY  Chest x-ray pending ____________________________________________   PROCEDURES  Procedure(s) performed: No  Procedures   Critical Care performed: No ____________________________________________   INITIAL IMPRESSION / ASSESSMENT AND PLAN / ED COURSE  Pertinent labs & imaging results that were available during my care of the patient were reviewed by me and considered in my medical decision making (see chart for details).  Patient presents with  complaints of diffuse weakness, bruise to his left leg.  Mildly tachycardic upon arrival afebrile.  Culture continues to be negative of synovial fluid, knee is well-appearing with no erythema, not consistent with septic joint.  Large ecchymosis to the left thigh, question significant blood loss although seems doubtful given the thigh is soft.  Pending labs including INR, will also obtain chest x-ray urinalysis to evaluate for infection other causes of weakness.   Patient has had a 3 g drop in his hemoglobin, this likely explains his weakness.  Otherwise lab work demonstrates mild dehydration but is reassuring.  Chest x-ray unremarkable.  I will admit the patient to the hospitalist service for hemoglobin rechecks    ____________________________________________   FINAL CLINICAL IMPRESSION(S) / ED DIAGNOSES  Final diagnoses:  Spontaneous hemorrhage  Generalized weakness  Dehydration        Note:  This document was prepared using Dragon voice recognition software and may include unintentional dictation errors.     Lavonia Drafts, MD 06/08/18 [phone]

## 2018-06-08 NOTE — Progress Notes (Signed)
   06/08/18 2100  Clinical Encounter Type  Visited With Patient  Visit Type Initial;Spiritual support  Referral From Nurse  Recommendations Follow-up, as needed.  Spiritual Encounters  Spiritual Needs Emotional (Patient accepted prayer.)  Stress Factors  Patient Stress Factors Health changes   Chaplain responded to OR for prayer and met with the patient. He is untroubled about his admittance and medical issue. Chaplain and patient engaged in a conversation about his family, professional career, passion for golf, concerns about his son's health, and his love of [employer] [employer]. Though the patient doubts the efficacy of prayer and doesn't know how it works, Clinical biochemist offered prayer which was well-received. Chaplain provided active listening, emotional support, and energetic prayer.

## 2018-06-08 NOTE — ED Notes (Signed)
Report to Stephanie, RN

## 2018-06-08 NOTE — Progress Notes (Signed)
Family Meeting Note  Advance Directive:no  Today a meeting took place with the Patient.  Patient is able to participate.  The following clinical team members were present during this meeting:MD  The following were discussed:Patient's diagnosis: , Patient's progosis: Unable to determine and Goals for treatment: Full Code  Additional follow-up to be provided: prn  Time spent during discussion:20 minutes  Katy D Mayo, MD  

## 2018-06-09 DIAGNOSIS — I10 Essential (primary) hypertension: Secondary | ICD-10-CM | POA: Diagnosis not present

## 2018-06-09 DIAGNOSIS — I480 Paroxysmal atrial fibrillation: Secondary | ICD-10-CM | POA: Diagnosis not present

## 2018-06-09 DIAGNOSIS — D62 Acute posthemorrhagic anemia: Secondary | ICD-10-CM | POA: Diagnosis not present

## 2018-06-09 DIAGNOSIS — S7012XA Contusion of left thigh, initial encounter: Secondary | ICD-10-CM | POA: Diagnosis not present

## 2018-06-09 LAB — BASIC METABOLIC PANEL
ANION GAP: 5 (ref 5–15)
BUN: 37 mg/dL — ABNORMAL HIGH (ref 8–23)
CO2: 27 mmol/L (ref 22–32)
Calcium: 8.1 mg/dL — ABNORMAL LOW (ref 8.9–10.3)
Chloride: 106 mmol/L (ref 98–111)
Creatinine, Ser: 1.32 mg/dL — ABNORMAL HIGH (ref 0.61–1.24)
GFR calc Af Amer: 55 mL/min — ABNORMAL LOW (ref >60)
GFR calc non Af Amer: 47 mL/min — ABNORMAL LOW (ref >60)
GLUCOSE: 96 mg/dL (ref 70–99)
Potassium: 4.3 mmol/L (ref 3.5–5.1)
Sodium: 138 mmol/L (ref 135–145)

## 2018-06-09 LAB — CBC
HCT: 27.5 % — ABNORMAL LOW (ref 39.0–52.0)
Hemoglobin: 8.9 g/dL — ABNORMAL LOW (ref 13.0–17.0)
MCH: 29.6 pg (ref 26.0–34.0)
MCHC: 32.4 g/dL (ref 30.0–36.0)
MCV: 91.4 fL (ref 80.0–100.0)
PLATELETS: 141 10*3/uL — AB (ref 150–400)
RBC: 3.01 MIL/uL — AB (ref 4.22–5.81)
RDW: 13.5 % (ref 11.5–15.5)
WBC: 6.1 10*3/uL (ref 4.0–10.5)
nRBC: 0 % (ref 0.0–0.2)

## 2018-06-09 LAB — BODY FLUID CULTURE
Culture: NO GROWTH
SPECIAL REQUESTS: NORMAL

## 2018-06-09 LAB — PROTIME-INR
INR: 3.36
PROTHROMBIN TIME: 33.8 s — AB (ref 11.4–15.2)

## 2018-06-09 MED ORDER — OXYCODONE HCL 5 MG PO TABS: 5.0000 mg | ORAL_TABLET | Freq: Four times a day (QID) | ORAL | 0 refills | Status: AC | PRN

## 2018-06-09 NOTE — Discharge Summary (Signed)
[place] [facility] [place] NAME: Ryan Pace    MR#:  [id number]  DATE OF BIRTH:  09-29-1928  DATE OF ADMISSION:  06/08/2018 ADMITTING PHYSICIAN: Sela Hua, MD  DATE OF DISCHARGE: 06/09/2018  1:10 PM  PRIMARY CARE PHYSICIAN: Tracie Harrier, MD    ADMISSION DIAGNOSIS:  Dehydration [E86.0] Spontaneous hemorrhage [R58] Generalized weakness [R53.1]  DISCHARGE DIAGNOSIS:  Active Problems:   Hematoma    HOSPITAL COURSE:   1.  Large left thigh hematoma, acute blood loss anemia, hemarthrosis.  Case discussed with Dr. Sabra Heck orthopedic surgery and he will follow-up the patient as outpatient.  Coumadin held at this point.  Patient given a dose of vitamin K.  Check hemoglobin as outpatient.  Patient already on iron supplementation.  Hemoglobin upon discharge 8.9.  INR 3.36 but has not seen a full effect of the vitamin K.  A prescription for few oxycodone given.  Ace bandage and ice recommended 2.  Paroxysmal atrial fibrillation.  Currently in normal sinus rhythm.  Hold Coumadin secondary to a large hematoma and hemarthrosis.  Continue verapamil. 3.  Dehydration.  Creatinine came back to baseline.  Can go back on low-dose Lasix and lisinopril as outpatient 4.  Hypertension on verapamil 5.  Hyperlipidemia unspecified on pravastatin 6.  Rolling walker and outpatient physical therapy recommended   DISCHARGE CONDITIONS:   Satisfactory  CONSULTS OBTAINED:  Orthopedic surgery Dr. Earnestine Leys  DRUG ALLERGIES:   Allergies  Allergen Reactions  . Penicillins Rash    DISCHARGE MEDICATIONS:   Allergies as of 06/09/2018      Reactions   Penicillins Rash      Medication List    STOP taking these medications   diclofenac sodium 1 % Gel Commonly known as:  VOLTAREN   gabapentin 400 MG capsule Commonly known as:  NEURONTIN   HYDROcodone-acetaminophen 5-325 MG tablet Commonly known as:  NORCO/VICODIN   warfarin 3 MG  tablet Commonly known as:  COUMADIN     TAKE these medications   cetirizine 5 MG tablet Commonly known as:  ZYRTEC Take 5-10 mg by mouth daily as needed for allergies.   cholecalciferol 1000 units tablet Commonly known as:  VITAMIN D Take 5,000 Units by mouth daily.   Doxepin HCl 6 MG Tabs Take 1 tablet by mouth at bedtime.   ferrous sulfate 325 (65 FE) MG EC tablet Take 325 mg by mouth daily.   furosemide 20 MG tablet Commonly known as:  LASIX Take 5 mg by mouth every morning.   lisinopril 20 MG tablet Commonly known as:  PRINIVIL,ZESTRIL Take 5 mg by mouth daily. What changed:  Another medication with the same name was removed. Continue taking this medication, and follow the directions you see here.   multivitamin with minerals tablet Take 1 tablet by mouth daily.   oxyCODONE 5 MG immediate release tablet Commonly known as:  Oxy IR/ROXICODONE Take 1 tablet (5 mg total) by mouth every 6 (six) hours as needed for up to 3 days.   pravastatin 20 MG tablet Commonly known as:  PRAVACHOL Take 20 mg by mouth at bedtime.   SUMAtriptan 50 MG tablet Commonly known as:  IMITREX Take 50 mg by mouth as needed for migraine. May repeat in 2 hours if headache persists or recurs.   verapamil 80 MG tablet Commonly known as:  CALAN Take 80 mg by mouth daily.   VITAMIN B-12 PO Take 1 tablet by mouth at bedtime.  Durable Medical Equipment  (From admission, onward)         Start     Ordered   06/09/18 1111  For home use only DME Walker rolling  Once    Question:  Patient needs a walker to treat with the following condition  Answer:  Knee pain   06/09/18 1111           DISCHARGE INSTRUCTIONS:      If you experience worsening of your admission symptoms, develop shortness of breath, life threatening emergency, suicidal or homicidal thoughts you must seek medical attention immediately by calling 911 or calling your MD immediately  if symptoms less  severe.  You Must read complete instructions/literature along with all the possible adverse reactions/side effects for all the Medicines you take and that have been prescribed to you. Take any new Medicines after you have completely understood and accept all the possible adverse reactions/side effects.   Please note  You were cared for by a hospitalist during your hospital stay. If you have any questions about your discharge medications or the care you received while you were in the hospital after you are discharged, you can call the unit and asked to speak with the hospitalist on call if the hospitalist that took care of you is not available. Once you are discharged, your primary care physician will handle any further medical issues. Please note that NO REFILLS for any discharge medications will be authorized once you are discharged, as it is imperative that you return to your primary care physician (or establish a relationship with a primary care physician if you do not have one) for your aftercare needs so that they can reassess your need for medications and monitor your lab values.    Today   CHIEF COMPLAINT:   Chief Complaint  Patient presents with  . Weakness  . large hematoma    HISTORY OF PRESENT ILLNESS:  Ryan Pace  is a 84 y.o. male presented a few times to the ER with left knee pain.  Found to have hemarthrosis and then developed large hematoma after arthrocentesis.   VITAL SIGNS:  Blood pressure (!) 116/56, pulse 96, temperature 98.7 F (37.1 C), temperature source Oral, resp. rate 18, height 5\' 9"  (1.753 m), weight 89.8 kg, SpO2 97 %.   PHYSICAL EXAMINATION:  GENERAL:  84 y.o.-year-old patient lying in the bed with no acute distress.  EYES: Pupils equal, round, reactive to light and accommodation. No scleral icterus. Extraocular muscles intact.  HEENT: Head atraumatic, normocephalic. Oropharynx and nasopharynx clear.  NECK:  Supple, no jugular venous distention. No  thyroid enlargement, no tenderness.  LUNGS: Normal breath sounds bilaterally, no wheezing, rales,rhonchi or crepitation. No use of accessory muscles of respiration.  CARDIOVASCULAR: S1, S2 normal. No murmurs, rubs, or gallops.  ABDOMEN: Soft, non-tender, non-distended. Bowel sounds present. No organomegaly or mass.  EXTREMITIES: No pedal edema, cyanosis, or clubbing.  Decreased range of motion left knee secondary to swelling. NEUROLOGIC: Cranial nerves II through XII are intact. Muscle strength 5/5 in all extremities. Sensation intact. Gait not checked.  PSYCHIATRIC: The patient is alert and oriented x 3.  SKIN: Large hematoma and bruising left thigh.  DATA REVIEW:   CBC Recent Labs  Lab 06/09/18 0423  WBC 6.1  HGB 8.9*  HCT 27.5*  PLT 141*    Chemistries  Recent Labs  Lab 06/08/18 1504 06/09/18 0423  NA 140 138  K 4.5 4.3  CL 106 106  CO2 29 27  GLUCOSE 132* 96  BUN 47* 37*  CREATININE 1.64* 1.32*  CALCIUM 8.7* 8.1*  AST 29  --   ALT 19  --   ALKPHOS 48  --   BILITOT 0.8  --     Cardiac Enzymes Recent Labs  Lab 06/08/18 1503  TROPONINI <0.03    Microbiology Results  Results for orders placed or performed during the hospital encounter of 06/05/18  Body fluid culture     Status: None   Collection Time: 06/05/18  1:34 PM  Result Value Ref Range Status   Specimen Description   Final    SYNOVIAL Performed at [facility], [address], [place], [place] [address]    Special Requests   Final    Normal Performed at [facility], [address] [place], [place] [address]    Gram Stain   Final    RARE WBC PRESENT, PREDOMINANTLY PMN NO ORGANISMS SEEN    Culture   Final    NO GROWTH 3 DAYS Performed at [facility], [place] [address] [place], [place] [address]    Report Status 06/09/2018 FINAL  Final    RADIOLOGY:  Dg Chest Portable 1 View  Result Date: 06/08/2018 CLINICAL DATA:  Weakness EXAM: PORTABLE CHEST 1 VIEW  COMPARISON:  11/14/07 FINDINGS: Cardiac shadows within normal limits. The lungs are well aerated bilaterally. No focal infiltrate or sizable effusion is seen. No bony abnormality is noted. IMPRESSION: No acute abnormality noted. Electronically Signed   By: Inez Catalina M.D.   On: 06/08/2018 15:56     Management plans discussed with the patient, family and they are in agreement.  CODE STATUS:     Code Status Orders  (From admission, onward)         Start     Ordered   06/08/18 1947  Full code  Continuous     06/08/18 1946        Code Status History    This patient has a current code status but no historical code status.    Advance Directive Documentation     Most Recent Value  Type of Advance Directive  Healthcare Power of Attorney, Living will  Pre-existing out of facility DNR order (yellow form or pink MOST form)  -  "MOST" Form in Place?  -      TOTAL TIME TAKING CARE OF THIS PATIENT: 35 minutes.    Loletha Grayer M.D on 06/09/2018 at 3:03 PM  Between 7am to 6pm - Pager - [phone]  After 6pm go to [email or web address] - password [email or web address]  [facility] [employer] Office  [phone]  CC: Primary care physician; Tracie Harrier, MD

## 2018-06-09 NOTE — Evaluation (Signed)
Physical Therapy Evaluation Patient Details Name: Ryan Pace MRN: [id number] DOB: July 04, 1929 Today's Date: 06/09/2018   History of Present Illness  Pt is an 84 y.o. male presenting to hospital 06/08/18 with weakness and large hematoma L inner thigh.  Of note, pt with recent ED visit 10/10 for L knee/swelling (s/p arthrocentesis) and 06/07/18 for continued L knee pain.  Pt admitted 10/13 with expanding L thigh hematoma with acute blood loss anemia and L knee effusion.  PMH includes paroxysmal a-fib, HLD, anemia, dupuytren contracture release, prostatitis, and cardiac cath.  Clinical Impression  Prior to hospital admission and recent ED visits, pt was independent with ambulation/functional mobility.  Pt lives with his wife in 1 level home with 2 steps to enter with R railing.  Currently pt is modified independent with bed mobility; SBA with transfers with walker use; and SBA ambulating 160 feet with RW.  Pt recently issued SW from ED and demonstrated difficulty sequencing and safely utilizing SW; pt demonstrating safe and steady ambulation using RW.  CM and MD notified of PT recommendations for 2 wheels to change SW into RW.  Impaired L LE knee flexion ROM and L LE strength noted.  Pt would benefit from skilled PT to address noted impairments and functional limitations (see below for any additional details).  Upon hospital discharge, recommend pt discharge to home with OP PT.    Follow Up Recommendations Outpatient PT    Equipment Recommendations  (2 wheels for current walker (SW) to make into 2 wheeled walker (discussed with CM and MD))    Recommendations for Other Services       Precautions / Restrictions Precautions Precautions: Fall Restrictions Weight Bearing Restrictions: No      Mobility  Bed Mobility Overal bed mobility: Modified Independent             General bed mobility comments: Semi-supine to/from sit without any noted difficulties.  Transfers Overall transfer  level: Needs assistance Equipment used: Rolling walker (2 wheeled);Standard walker Transfers: Sit to/from Stand Sit to Stand: Supervision         General transfer comment: initial vc's for safe transfer technique using walker  Ambulation/Gait Ambulation/Gait assistance: Min guard;Supervision Gait Distance (Feet): (160 feet RW; 80 feet SW) Assistive device: Rolling walker (2 wheeled);Standard walker   Gait velocity: decreased   General Gait Details: vc's to stay closer to walker and within walker during turns; initial cueing for RW use and then improved ambulation noted; pt intermittently unsteady and had difficulty coordinating ambulation using SW so CGA provided for safety; mild decreased stance time noted L LE  Stairs            Wheelchair Mobility    Modified Rankin (Stroke Patients Only)       Balance Overall balance assessment: Needs assistance Sitting-balance support: No upper extremity supported;Feet supported Sitting balance-Leahy Scale: Normal Sitting balance - Comments: steady sitting reaching outside BOS   Standing balance support: No upper extremity supported Standing balance-Leahy Scale: Good Standing balance comment: steady standing reaching within BOS                             Pertinent Vitals/Pain Pain Assessment: 0-10 Pain Score: 4  Pain Location: L knee Pain Descriptors / Indicators: Sore;Aching Pain Intervention(s): Limited activity within patient's tolerance;Monitored during session;Repositioned  Vitals (HR and O2 on room air) stable and WFL throughout treatment session.    Home Living Family/patient expects to be discharged to::  Private residence Living Arrangements: Spouse/significant other Available Help at Discharge: Family Type of Home: House Home Access: Stairs to enter Entrance Stairs-Rails: Right Entrance Stairs-Number of Steps: 2 Home Layout: One level [place]: Walker - standard      Prior Function Level  of Independence: Independent         Comments: Using SW or furniture cruising since ED visit 06/05/18.  Independent with ambulation prior to that.     Hand Dominance        Extremity/Trunk Assessment   Upper Extremity Assessment Upper Extremity Assessment: Overall WFL for tasks assessed    Lower Extremity Assessment Lower Extremity Assessment: RLE deficits/detail;LLE deficits/detail RLE Deficits / Details: strength and ROM WFL LLE Deficits / Details: increased difficulty to perform L LE SLR; L knee flexion ROM grossly 85 degrees (limited d/t "tightness" feeling), at least 3/5 hip flexion, knee extension, and DF    Cervical / Trunk Assessment Cervical / Trunk Assessment: Normal  Communication   Communication: No difficulties  Cognition Arousal/Alertness: Awake/alert Behavior During Therapy: WFL for tasks assessed/performed Overall Cognitive Status: Within Functional Limits for tasks assessed                                        General Comments General comments (skin integrity, edema, etc.): L knee swelling noted; L inner thigh bruising noted Nursing cleared pt for participation in physical therapy.  Pt agreeable to PT session.  Pt's wife and son present during the session.   Exercises     Assessment/Plan    PT Assessment Patient needs continued PT services  PT Problem List Decreased strength;Decreased range of motion;Decreased balance;Decreased mobility;Decreased knowledge of use of DME;Decreased knowledge of precautions;Pain       PT Treatment Interventions DME instruction;Gait training;Stair training;Functional mobility training;Therapeutic activities;Therapeutic exercise;Balance training;Patient/family education    PT Goals (Current goals can be found in the Care Plan section)  Acute Rehab PT Goals Patient Stated Goal: to have less L LE pain PT Goal Formulation: With patient Time For Goal Achievement: 06/23/18 Potential to Achieve Goals:  Good    Frequency Min 2X/week   Barriers to discharge        Co-evaluation               AM-PAC PT "6 Clicks" Daily Activity  Outcome Measure Difficulty turning over in bed (including adjusting bedclothes, sheets and blankets)?: None Difficulty moving from lying on back to sitting on the side of the bed? : None Difficulty sitting down on and standing up from a chair with arms (e.g., wheelchair, bedside commode, etc,.)?: Unable Help needed moving to and from a bed to chair (including a wheelchair)?: A Little Help needed walking in hospital room?: A Little Help needed climbing 3-5 steps with a railing? : A Little 6 Click Score: 18    End of Session Equipment Utilized During Treatment: Gait belt Activity Tolerance: Patient tolerated treatment well Patient left: in bed;with call bell/phone within reach;with bed alarm set;with family/visitor present;Other (comment)(L LE elevated) Nurse Communication: Mobility status;Precautions PT Visit Diagnosis: Other abnormalities of gait and mobility (R26.89);Muscle weakness (generalized) (M62.81);Difficulty in walking, not elsewhere classified (R26.2);Pain Pain - Right/Left: Left Pain - part of body: Knee    Time: 2355-7322 PT Time Calculation (min) (ACUTE ONLY): 33 min   Charges:   PT Evaluation $PT Eval Low Complexity: 1 Low PT Treatments $Gait Training: 8-22 mins  Leitha Bleak, PT 06/09/18, 2:02 PM [phone]

## 2018-06-09 NOTE — Consult Note (Signed)
ORTHOPAEDIC CONSULTATION  REQUESTING PHYSICIAN: Loletha Grayer, MD  Chief Complaint: Swelling left knee and leg  HPI: Ryan Pace is a 84 y.o. male who complains of left knee and leg swelling for several days.  He presented to the emergency room several times.  He had 40 cc of blood aspirated.  He is on warfarin and his INR has been elevated around 3.38.  He is having minimal pain.  There is no redness or warmth.  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  He does not have any recent history of trauma that he recalls.  Past Medical History:  Diagnosis Date  . Anemia   . Dysrhythmia    atrial fibrillation  . Headache   . Hyperlipidemia   . Kidney stones   . Prostatitis    Past Surgical History:  Procedure Laterality Date  . APPENDECTOMY    . CARDIAC CATHETERIZATION    . CATARACT EXTRACTION, BILATERAL    . COLONOSCOPY     1998, 2003, 2011  . DUPUYTREN CONTRACTURE RELEASE Bilateral   . DUPUYTREN CONTRACTURE RELEASE Right 01/11/2016   Procedure: DUPUYTREN CONTRACTURE RELEASE;  Surgeon: Earnestine Leys, MD;  Location: [facility] ORS;  Service: Orthopedics;  Laterality: Right;  . [place]  . GREEN LIGHT LASER TURP (TRANSURETHRAL RESECTION OF PROSTATE N/A 02/15/2015   Procedure: GREEN LIGHT LASER TURP (TRANSURETHRAL RESECTION OF PROSTATE;  Surgeon: Royston Cowper, MD;  Location: [facility] ORS;  Service: Urology;  Laterality: N/A;  . TONSILLECTOMY     Social History   Socioeconomic History  . Marital status: Married    Spouse name: Not on file  . Number of children: Not on file  . Years of education: Not on file  . Highest education level: Not on file  Occupational History  . Not on file  Social Needs  . Financial resource strain: Not on file  . Food insecurity:    Worry: Not on file    Inability: Not on file  . Transportation needs:    Medical: Not on file    Non-medical: Not on file  Tobacco Use  . Smoking status: Never Smoker  .  Smokeless tobacco: Former Network engineer and Sexual Activity  . Alcohol use: No  . Drug use: No  . Sexual activity: Not on file  Lifestyle  . Physical activity:    Days per week: Not on file    Minutes per session: Not on file  . Stress: Not on file  Relationships  . Social connections:    Talks on phone: Not on file    Gets together: Not on file    Attends religious service: Not on file    Active member of club or organization: Not on file    Attends meetings of clubs or organizations: Not on file    Relationship status: Not on file  Other Topics Concern  . Not on file  Social History Narrative  . Not on file   History reviewed. No pertinent family history. Allergies  Allergen Reactions  . Penicillins Rash   Prior to Admission medications   Medication Sig Start Date End Date Taking? Authorizing Provider  cetirizine (ZYRTEC) 5 MG tablet Take 5-10 mg by mouth daily as needed for allergies.    Yes [provider]  cholecalciferol (VITAMIN D) 1000 units tablet Take 5,000 Units by mouth daily.   Yes [provider]  Cyanocobalamin (VITAMIN B-12 PO) Take 1 tablet by mouth at bedtime.  Yes [provider]  diclofenac sodium (VOLTAREN) 1 % GEL Apply 4 g topically 4 (four) times daily. 06/07/18  Yes Nance Pear, MD  ferrous sulfate 325 (65 FE) MG EC tablet Take 325 mg by mouth daily.   Yes [provider]  furosemide (LASIX) 20 MG tablet Take 5 mg by mouth every morning.   Yes [provider]  lisinopril (PRINIVIL,ZESTRIL) 20 MG tablet Take 5 mg by mouth daily.    Yes [provider]  lisinopril (PRINIVIL,ZESTRIL) 5 MG tablet Take 5 mg by mouth daily.   Yes [provider]  SUMAtriptan (IMITREX) 50 MG tablet Take 50 mg by mouth as needed for migraine. May repeat in 2 hours if headache persists or recurs.   Yes [provider]  verapamil (CALAN) 80 MG tablet Take 80 mg by mouth daily.   Yes [provider]  warfarin (COUMADIN) 3 MG tablet Take 3 mg by mouth daily.   Yes [provider]  Doxepin HCl 6 MG TABS Take 1 tablet by mouth at bedtime.    [provider]  gabapentin (NEURONTIN) 400 MG capsule Take 1 capsule (400 mg total) by mouth 3 (three) times daily. Patient not taking: Reported on 06/08/2018 01/11/16   Earnestine Leys, MD  HYDROcodone-acetaminophen [facility]) 5-325 MG tablet Take 1 tablet by mouth every 6 (six) hours as needed. Patient not taking: Reported on 06/08/2018 01/11/16   Earnestine Leys, MD  Multiple Vitamins-Minerals (MULTIVITAMIN WITH MINERALS) tablet Take 1 tablet by mouth daily.    [provider]  oxyCODONE (ROXICODONE) 5 MG immediate release tablet Take 1 tablet (5 mg total) by mouth every 6 (six) hours as needed for up to 3 days. 06/09/18 06/12/18  Loletha Grayer, MD  pravastatin (PRAVACHOL) 20 MG tablet Take 20 mg by mouth at bedtime.     [provider]   Dg Chest Portable 1 View  Result Date: 06/08/2018 CLINICAL DATA:  Weakness EXAM: PORTABLE CHEST 1 VIEW COMPARISON:  11/14/07 FINDINGS: Cardiac shadows within normal limits. The lungs are well aerated bilaterally. No focal infiltrate or sizable effusion is seen. No bony abnormality is noted. IMPRESSION: No acute abnormality noted. Electronically Signed   By: Inez Catalina M.D.   On: 06/08/2018 15:56    Positive ROS: All other systems have been reviewed and were otherwise negative with the exception of those mentioned in the HPI and as above.  Physical Exam: General: Alert, no acute distress Cardiovascular: No pedal edema Respiratory: No cyanosis, no use of accessory musculature GI: No organomegaly, abdomen is soft and non-tender Skin: No lesions in the area of chief complaint Neurologic: Sensation intact distally Psychiatric: Patient is competent for consent with normal mood and affect Lymphatic: No axillary or cervical lymphadenopathy  MUSCULOSKELETAL: Left knee is  minimally swollen.  The thigh has some ecchymosis.  There is no warmth or redness.  Passive range of motion is fair.  Neurovascular status is good.  The hip moves well.  There is no other joint swelling.  Assessment: Hemarthrosis secondary to over anticoagulation.  This appears to be resolving and does not appear to be critical in any way.  Plan: Ace wrap and ice To bring INR level down to around 2.0 if possible. Recheck in my office in 3 to 4 days. Recheck with Dr. Ginette Pitman in 3 to 4 days.    Park Breed, MD [phone]   06/09/2018 12:06 PM

## 2018-06-09 NOTE — Care Management Note (Signed)
Case Management Note  Patient Details  Name: Ryan Pace MRN: [id number] Date of Birth: August 12, 1929  Subjective/Objective:   Admitted to [facility] under observation status with the diagnosis of right knee hematoma. Lives with wife, Ryan Pace [phone]). Last seen DR. Hande about a month ago. Prescriptions are filled at [facility]. No home Health. No skilled facility. No home oxygen. No medical equipment in the home. Takes care of all basic activities of daily living himself, drives. No falls. Decreased appetite, but better now. Family will transport                Action/Plan: Physical therapy is recommending outpatient therapy. [name] is in agreement. Will get referral signed and fax to rehab, department Rolling walker ordered. Floydene Flock, [facility] representative will bring to room   Expected Discharge Date:  06/09/18               Expected Discharge Plan:     In-House Referral:   yes  Discharge planning Services   yes  Post Acute Care Choice:   yes Choice offered to:   patient  DME Arranged:   yes DME Agency:   [facility]  HH Arranged:    [place] Agency:     Status of Service:     If discussed at [place] of Stay Meetings, dates discussed:    Additional Comments:  Shelbie Ammons, RN MSN CCM Care Management [phone] 06/09/2018, 12:35 PM

## 2018-06-09 NOTE — Care Management Obs Status (Signed)
[facility] NOTIFICATION   Patient Details  Name: GILBERT MANOLIS MRN: [id number] Date of Birth: 1928-11-14   Medicare Observation Status Notification Given:  Yes: Explained to Mr. Kaulana Brindle, RN 06/09/2018, 12:25 PM

## 2018-06-10 DIAGNOSIS — I2510 Atherosclerotic heart disease of native coronary artery without angina pectoris: Secondary | ICD-10-CM | POA: Diagnosis not present

## 2018-06-10 DIAGNOSIS — I480 Paroxysmal atrial fibrillation: Secondary | ICD-10-CM | POA: Diagnosis not present

## 2018-06-10 DIAGNOSIS — I10 Essential (primary) hypertension: Secondary | ICD-10-CM | POA: Diagnosis not present

## 2018-06-10 LAB — HEMOGLOBIN A1C
Hgb A1c MFr Bld: 5.5 % (ref 4.8–5.6)
Mean Plasma Glucose: 111 mg/dL

## 2018-06-12 DIAGNOSIS — S8012XA Contusion of left lower leg, initial encounter: Secondary | ICD-10-CM | POA: Diagnosis not present

## 2018-06-16 DIAGNOSIS — M25062 Hemarthrosis, left knee: Secondary | ICD-10-CM | POA: Diagnosis not present

## 2018-06-16 DIAGNOSIS — D62 Acute posthemorrhagic anemia: Secondary | ICD-10-CM | POA: Diagnosis not present

## 2018-06-16 DIAGNOSIS — Z09 Encounter for follow-up examination after completed treatment for conditions other than malignant neoplasm: Secondary | ICD-10-CM | POA: Diagnosis not present

## 2018-06-16 DIAGNOSIS — I10 Essential (primary) hypertension: Secondary | ICD-10-CM | POA: Diagnosis not present

## 2018-06-16 DIAGNOSIS — I480 Paroxysmal atrial fibrillation: Secondary | ICD-10-CM | POA: Diagnosis not present

## 2018-06-16 DIAGNOSIS — S7012XA Contusion of left thigh, initial encounter: Secondary | ICD-10-CM | POA: Diagnosis not present

## 2018-06-20 DIAGNOSIS — S8012XA Contusion of left lower leg, initial encounter: Secondary | ICD-10-CM | POA: Diagnosis not present

## 2018-06-30 DIAGNOSIS — M6281 Muscle weakness (generalized): Secondary | ICD-10-CM | POA: Diagnosis not present

## 2018-06-30 DIAGNOSIS — R2689 Other abnormalities of gait and mobility: Secondary | ICD-10-CM | POA: Diagnosis not present

## 2018-06-30 DIAGNOSIS — M25662 Stiffness of left knee, not elsewhere classified: Secondary | ICD-10-CM | POA: Diagnosis not present

## 2018-07-09 DIAGNOSIS — I480 Paroxysmal atrial fibrillation: Secondary | ICD-10-CM | POA: Diagnosis not present

## 2018-07-09 DIAGNOSIS — I2510 Atherosclerotic heart disease of native coronary artery without angina pectoris: Secondary | ICD-10-CM | POA: Diagnosis not present

## 2018-07-09 DIAGNOSIS — I340 Nonrheumatic mitral (valve) insufficiency: Secondary | ICD-10-CM | POA: Diagnosis not present

## 2018-07-09 DIAGNOSIS — I83892 Varicose veins of left lower extremities with other complications: Secondary | ICD-10-CM | POA: Diagnosis not present

## 2018-07-11 DIAGNOSIS — R2689 Other abnormalities of gait and mobility: Secondary | ICD-10-CM | POA: Diagnosis not present

## 2018-07-11 DIAGNOSIS — M25662 Stiffness of left knee, not elsewhere classified: Secondary | ICD-10-CM | POA: Diagnosis not present

## 2018-07-11 DIAGNOSIS — M6281 Muscle weakness (generalized): Secondary | ICD-10-CM | POA: Diagnosis not present

## 2018-07-16 DIAGNOSIS — M25662 Stiffness of left knee, not elsewhere classified: Secondary | ICD-10-CM | POA: Diagnosis not present

## 2018-07-16 DIAGNOSIS — M6281 Muscle weakness (generalized): Secondary | ICD-10-CM | POA: Diagnosis not present

## 2018-07-16 DIAGNOSIS — R2689 Other abnormalities of gait and mobility: Secondary | ICD-10-CM | POA: Diagnosis not present

## 2018-07-18 DIAGNOSIS — M25662 Stiffness of left knee, not elsewhere classified: Secondary | ICD-10-CM | POA: Diagnosis not present

## 2018-07-18 DIAGNOSIS — M6281 Muscle weakness (generalized): Secondary | ICD-10-CM | POA: Diagnosis not present

## 2018-07-18 DIAGNOSIS — R2689 Other abnormalities of gait and mobility: Secondary | ICD-10-CM | POA: Diagnosis not present

## 2018-08-26 DIAGNOSIS — S8012XA Contusion of left lower leg, initial encounter: Secondary | ICD-10-CM | POA: Diagnosis not present

## 2018-08-28 DIAGNOSIS — M25562 Pain in left knee: Secondary | ICD-10-CM | POA: Diagnosis not present

## 2018-08-28 DIAGNOSIS — D649 Anemia, unspecified: Secondary | ICD-10-CM | POA: Diagnosis not present

## 2018-09-02 DIAGNOSIS — M1712 Unilateral primary osteoarthritis, left knee: Secondary | ICD-10-CM | POA: Diagnosis not present

## 2018-09-02 DIAGNOSIS — M25062 Hemarthrosis, left knee: Secondary | ICD-10-CM | POA: Diagnosis not present

## 2018-09-02 DIAGNOSIS — S8012XA Contusion of left lower leg, initial encounter: Secondary | ICD-10-CM | POA: Diagnosis not present

## 2018-09-09 DIAGNOSIS — S8012XA Contusion of left lower leg, initial encounter: Secondary | ICD-10-CM | POA: Diagnosis not present

## 2018-09-09 DIAGNOSIS — M1712 Unilateral primary osteoarthritis, left knee: Secondary | ICD-10-CM | POA: Diagnosis not present

## 2018-09-09 DIAGNOSIS — M25062 Hemarthrosis, left knee: Secondary | ICD-10-CM | POA: Diagnosis not present

## 2018-09-19 DIAGNOSIS — M25062 Hemarthrosis, left knee: Secondary | ICD-10-CM | POA: Diagnosis not present

## 2018-09-19 DIAGNOSIS — M1712 Unilateral primary osteoarthritis, left knee: Secondary | ICD-10-CM | POA: Insufficient documentation

## 2018-09-29 DIAGNOSIS — M1712 Unilateral primary osteoarthritis, left knee: Secondary | ICD-10-CM | POA: Diagnosis not present

## 2018-10-02 DIAGNOSIS — M25062 Hemarthrosis, left knee: Secondary | ICD-10-CM | POA: Diagnosis not present

## 2018-10-02 DIAGNOSIS — M1712 Unilateral primary osteoarthritis, left knee: Secondary | ICD-10-CM | POA: Diagnosis not present

## 2018-10-07 ENCOUNTER — Other Ambulatory Visit (INDEPENDENT_AMBULATORY_CARE_PROVIDER_SITE_OTHER): Payer: Self-pay | Admitting: Vascular Surgery

## 2018-10-07 DIAGNOSIS — M25062 Hemarthrosis, left knee: Secondary | ICD-10-CM

## 2018-10-08 ENCOUNTER — Encounter (INDEPENDENT_AMBULATORY_CARE_PROVIDER_SITE_OTHER): Payer: Self-pay | Admitting: Vascular Surgery

## 2018-10-08 ENCOUNTER — Ambulatory Visit (INDEPENDENT_AMBULATORY_CARE_PROVIDER_SITE_OTHER): Payer: Medicare HMO

## 2018-10-08 ENCOUNTER — Ambulatory Visit (INDEPENDENT_AMBULATORY_CARE_PROVIDER_SITE_OTHER): Payer: Medicare HMO | Admitting: Vascular Surgery

## 2018-10-08 VITALS — BP 155/97 | HR 111 | Resp 14 | Ht 69.0 in | Wt 192.8 lb

## 2018-10-08 DIAGNOSIS — T148XXA Other injury of unspecified body region, initial encounter: Secondary | ICD-10-CM

## 2018-10-08 DIAGNOSIS — M25062 Hemarthrosis, left knee: Secondary | ICD-10-CM | POA: Diagnosis not present

## 2018-10-08 DIAGNOSIS — I10 Essential (primary) hypertension: Secondary | ICD-10-CM | POA: Diagnosis not present

## 2018-10-08 DIAGNOSIS — Z87891 Personal history of nicotine dependence: Secondary | ICD-10-CM | POA: Diagnosis not present

## 2018-10-08 NOTE — Progress Notes (Signed)
Subjective:    Patient ID: Ryan Pace, male    DOB: Oct 26, 1928, 85 y.o.   MRN: [id number] Chief Complaint  Patient presents with  . New Patient (Initial Visit)   Presents as a new patient referred by Dr. Harlow Mares for hemarthrosis to the left knee.  The patient endorses a history of spontaneous bleeding to the right thigh in the past.  The patient was on Coumadin in the past however was transitioned to Eliquis.  The patient notes that he is not taking Eliquis at the present time due to the medication "irritating" his knee.  It does sound like the patient has had an episode of hemarthrosis to the left knee with some ecchymosis tracking down the leg.  He presents today without any symptoms or signs of bleeding. Patient notes that these episodes happen without trauma.  The patient underwent a left lower extremity arterial duplex which was notable for triphasic blood flow from the common femoral artery distally throughout the three tibial arteries.  There was no evidence of atypical arterial flow patterns noted in the left popliteal region.  Patient denies any claudication-like symptoms, rest pain or ulcer formation to the bilateral legs.  Patient denies any fever, nausea vomiting.  Review of Systems  Constitutional: Negative.   HENT: Negative.   Eyes: Negative.   Respiratory: Negative.   Cardiovascular:       Hemarthosis to the left knee  Gastrointestinal: Negative.   Endocrine: Negative.   Genitourinary: Negative.   Musculoskeletal: Negative.   Skin: Negative.   Allergic/Immunologic: Negative.   Neurological: Negative.   Hematological: Negative.   Psychiatric/Behavioral: Negative.       Objective:   Physical Exam Vitals signs reviewed.  Constitutional:      Appearance: Normal appearance.  HENT:     Head: Normocephalic and atraumatic.     Right Ear: External ear normal.     Left Ear: External ear normal.     Nose: Nose normal.     Mouth/Throat:     Mouth: Mucous membranes are  moist.     Pharynx: Oropharynx is clear.  Eyes:     Extraocular Movements: Extraocular movements intact.     Conjunctiva/sclera: Conjunctivae normal.     Pupils: Pupils are equal, round, and reactive to light.  Neck:     Musculoskeletal: Normal range of motion.  Cardiovascular:     Rate and Rhythm: Normal rate and regular rhythm.     Pulses: Normal pulses.     Heart sounds: Normal heart sounds.  Pulmonary:     Effort: Pulmonary effort is normal.     Breath sounds: Normal breath sounds.  Musculoskeletal: Normal range of motion.  Skin:    General: Skin is warm and dry.     Comments: No ecchymosis noted to the lower extremity and joints.  Neurological:     General: No focal deficit present.     Mental Status: He is alert and oriented to person, place, and time. Mental status is at baseline.  Psychiatric:        Mood and Affect: Mood normal.        Behavior: Behavior normal.        Thought Content: Thought content normal.        Judgment: Judgment normal.    BP (!) 155/97 (BP Location: Right Arm, Patient Position: Sitting)   Pulse (!) 111   Resp 14   Ht 5\' 9"  (1.753 m)   Wt 192 lb 12.8 oz (87.5 kg)  BMI 28.47 kg/m   Past Medical History:  Diagnosis Date  . Anemia   . Dysrhythmia    atrial fibrillation  . Headache   . Hyperlipidemia   . Kidney stones   . Prostatitis    Social History   Socioeconomic History  . Marital status: Married    Spouse name: Not on file  . Number of children: Not on file  . Years of education: Not on file  . Highest education level: Not on file  Occupational History  . Not on file  Social Needs  . Financial resource strain: Not on file  . Food insecurity:    Worry: Not on file    Inability: Not on file  . Transportation needs:    Medical: Not on file    Non-medical: Not on file  Tobacco Use  . Smoking status: Never Smoker  . Smokeless tobacco: Former Network engineer and Sexual Activity  . Alcohol use: No  . Drug use: No  .  Sexual activity: Not on file  Lifestyle  . Physical activity:    Days per week: Not on file    Minutes per session: Not on file  . Stress: Not on file  Relationships  . Social connections:    Talks on phone: Not on file    Gets together: Not on file    Attends religious service: Not on file    Active member of club or organization: Not on file    Attends meetings of clubs or organizations: Not on file    Relationship status: Not on file  . Intimate partner violence:    Fear of current or ex partner: Not on file    Emotionally abused: Not on file    Physically abused: Not on file    Forced sexual activity: Not on file  Other Topics Concern  . Not on file  Social History Narrative  . Not on file   Past Surgical History:  Procedure Laterality Date  . APPENDECTOMY    . CARDIAC CATHETERIZATION    . CATARACT EXTRACTION, BILATERAL    . COLONOSCOPY     1998, 2003, 2011  . DUPUYTREN CONTRACTURE RELEASE Bilateral   . DUPUYTREN CONTRACTURE RELEASE Right 01/11/2016   Procedure: DUPUYTREN CONTRACTURE RELEASE;  Surgeon: Earnestine Leys, MD;  Location: [facility] ORS;  Service: Orthopedics;  Laterality: Right;  . [place]  . GREEN LIGHT LASER TURP (TRANSURETHRAL RESECTION OF PROSTATE N/A 02/15/2015   Procedure: GREEN LIGHT LASER TURP (TRANSURETHRAL RESECTION OF PROSTATE;  Surgeon: Royston Cowper, MD;  Location: [facility] ORS;  Service: Urology;  Laterality: N/A;  . TONSILLECTOMY     History reviewed. No pertinent family history.  Allergies  Allergen Reactions  . Penicillins Rash      Assessment & Plan:  Presents as a new patient referred by Dr. Harlow Mares for hemarthrosis to the left knee.  The patient endorses a history of spontaneous bleeding to the right thigh in the past.  The patient was on Coumadin in the past however was transitioned to Eliquis.  The patient notes that he is not taking Eliquis at the present time due to the medication "irritating" his knee.  It does sound  like the patient has had an episode of hemarthrosis to the left knee with some ecchymosis tracking down the leg.  He presents today without any symptoms or signs of bleeding. Patient notes that these episodes happen without trauma.  The patient underwent a left lower  extremity arterial duplex which was notable for triphasic blood flow from the common femoral artery distally throughout the three tibial arteries.  There was no evidence of atypical arterial flow patterns noted in the left popliteal region.  Patient denies any claudication-like symptoms, rest pain or ulcer formation to the bilateral legs.  Patient denies any fever, nausea vomiting.  1. Hemarthrosis - New Patient presents to our clinic for the first time today with a chief complaint of spontaneous bleeding and hemarthrosis to the left knee. The patient was on Coumadin and then transitioned to Eliquis.  At this time, the patient is not taking the Eliquis. The patient underwent a left lower extremity arterial duplex which was notable for triphasic blood flow distally throughout all arteries.  There was no anatomic anomalies noted to the left knee region. This was explained to the patient and he expresses his understanding The test and my note will be available to Dr. Harlow Mares through Epic Patient to follow up PRN  2. Essential hypertension - Stable Encouraged good control as its slows the progression of atherosclerotic disease  Current Outpatient Medications on File Prior to Visit  Medication Sig Dispense Refill  . cetirizine (ZYRTEC) 5 MG tablet Take 5-10 mg by mouth daily as needed for allergies.     . cholecalciferol (VITAMIN D) 1000 units tablet Take 5,000 Units by mouth daily.    . Cyanocobalamin (VITAMIN B-12 PO) Take 1 tablet by mouth at bedtime.    . ferrous sulfate 325 (65 FE) MG EC tablet Take 325 mg by mouth daily.    Marland Kitchen lisinopril (PRINIVIL,ZESTRIL) 20 MG tablet Take 5 mg by mouth daily.     . Multiple Vitamins-Minerals  (MULTIVITAMIN WITH MINERALS) tablet Take 1 tablet by mouth daily.    . SUMAtriptan (IMITREX) 50 MG tablet Take 50 mg by mouth as needed for migraine. May repeat in 2 hours if headache persists or recurs.    . verapamil (CALAN) 80 MG tablet Take 80 mg by mouth daily.     No current facility-administered medications on file prior to visit.    There are no Patient Instructions on file for this visit. Return if symptoms worsen or fail to improve.  KIMBERLY A STEGMAYER, PA-C

## 2018-10-16 DIAGNOSIS — M1712 Unilateral primary osteoarthritis, left knee: Secondary | ICD-10-CM | POA: Diagnosis not present

## 2018-10-20 ENCOUNTER — Ambulatory Visit: Payer: Self-pay | Admitting: Orthopedic Surgery

## 2018-10-21 DIAGNOSIS — E782 Mixed hyperlipidemia: Secondary | ICD-10-CM | POA: Diagnosis not present

## 2018-10-21 DIAGNOSIS — I2510 Atherosclerotic heart disease of native coronary artery without angina pectoris: Secondary | ICD-10-CM | POA: Diagnosis not present

## 2018-10-21 DIAGNOSIS — I10 Essential (primary) hypertension: Secondary | ICD-10-CM | POA: Diagnosis not present

## 2018-10-21 DIAGNOSIS — I480 Paroxysmal atrial fibrillation: Secondary | ICD-10-CM | POA: Diagnosis not present

## 2018-10-23 ENCOUNTER — Encounter
Admission: RE | Admit: 2018-10-23 | Discharge: 2018-10-23 | Disposition: A | Discharge status: Home / Self Care | Payer: Medicare HMO | Source: Ambulatory Visit | Attending: Orthopedic Surgery | Admitting: Orthopedic Surgery

## 2018-10-23 ENCOUNTER — Other Ambulatory Visit: Payer: Self-pay

## 2018-10-23 DIAGNOSIS — Z01818 Encounter for other preprocedural examination: Secondary | ICD-10-CM | POA: Insufficient documentation

## 2018-10-23 DIAGNOSIS — M25062 Hemarthrosis, left knee: Secondary | ICD-10-CM | POA: Insufficient documentation

## 2018-10-23 LAB — CBC
HCT: 38.9 % — ABNORMAL LOW (ref 39.0–52.0)
Hemoglobin: 12.3 g/dL — ABNORMAL LOW (ref 13.0–17.0)
MCH: 30.2 pg (ref 26.0–34.0)
MCHC: 31.6 g/dL (ref 30.0–36.0)
MCV: 95.6 fL (ref 80.0–100.0)
NRBC: 0 % (ref 0.0–0.2)
Platelets: 150 10*3/uL (ref 150–400)
RBC: 4.07 MIL/uL — ABNORMAL LOW (ref 4.22–5.81)
RDW: 13.9 % (ref 11.5–15.5)
WBC: 5.6 10*3/uL (ref 4.0–10.5)

## 2018-10-23 LAB — URINALYSIS, ROUTINE W REFLEX MICROSCOPIC
Bilirubin Urine: NEGATIVE
Glucose, UA: NEGATIVE mg/dL
Hgb urine dipstick: NEGATIVE
Ketones, ur: NEGATIVE mg/dL
Leukocytes,Ua: NEGATIVE
Nitrite: NEGATIVE
Protein, ur: NEGATIVE mg/dL
Specific Gravity, Urine: 1.02 (ref 1.005–1.030)
pH: 5 (ref 5.0–8.0)

## 2018-10-23 LAB — TYPE AND SCREEN
ABO/RH(D): A POS
Antibody Screen: NEGATIVE

## 2018-10-23 LAB — SURGICAL PCR SCREEN
MRSA, PCR: NEGATIVE
Staphylococcus aureus: NEGATIVE

## 2018-10-23 LAB — BASIC METABOLIC PANEL
ANION GAP: 5 (ref 5–15)
BUN: 36 mg/dL — ABNORMAL HIGH (ref 8–23)
CO2: 29 mmol/L (ref 22–32)
Calcium: 8.8 mg/dL — ABNORMAL LOW (ref 8.9–10.3)
Chloride: 105 mmol/L (ref 98–111)
Creatinine, Ser: 1.62 mg/dL — ABNORMAL HIGH (ref 0.61–1.24)
GFR calc Af Amer: 44 mL/min — ABNORMAL LOW (ref >60)
GFR calc non Af Amer: 38 mL/min — ABNORMAL LOW (ref >60)
Glucose, Bld: 89 mg/dL (ref 70–99)
Potassium: 4.5 mmol/L (ref 3.5–5.1)
Sodium: 139 mmol/L (ref 135–145)

## 2018-10-23 LAB — PROTIME-INR
INR: 1 (ref 0.8–1.2)
Prothrombin Time: 12.8 seconds (ref 11.4–15.2)

## 2018-10-23 LAB — APTT: aPTT: 29 seconds (ref 24–36)

## 2018-10-23 NOTE — Patient Instructions (Signed)
  Your procedure is scheduled on: Thursday October 30, 2018 Report to Same Day Surgery 2nd floor [facility] Entrance-take elevator on left to 2nd floor.  Check in with surgery information desk.) To find out your arrival time, call [phone] 1:00-3:00 PM on Wednesday October 29, 2018  Remember: Instructions that are not followed completely may result in serious medical risk, up to and including death, or upon the discretion of your surgeon and anesthesiologist your surgery may need to be rescheduled.    __x__ 1. Do not eat food (including mints, candies, chewing gum) after midnight the night before your procedure. You may drink clear liquids up to 2 hours before you are scheduled to arrive at the hospital for your procedure.  Do not drink anything within 2 hours of your scheduled arrival to the hospital.  Approved clear liquids:  --Water or Apple juice without pulp  --Clear carbohydrate beverage such as Gatorade or Powerade  --Black Coffee or Clear Tea (No milk, no creamers, do not add anything to the coffee or tea)    __x__ 2. No Alcohol for 24 hours before or after surgery.   __x__ 3. No Smoking or e-cigarettes for 24 hours before surgery.  Do not use any chewable tobacco products for at least 6 hours before surgery.   __x__ 4. Notify your doctor if there is any change in your medical condition (cold, fever, infections).   __x__ 5. On the morning of surgery brush your teeth with toothpaste and water.  You may rinse your mouth with mouthwash if you wish.  Do not swallow any toothpaste or mouthwash.  Please read over the following fact sheets that you were given:   [facility] Preparing for Surgery and/or MRSA Information    __x__ Use CHG Soap or Sage wipes as directed on instruction sheet.   Do not wear jewelry on the day of surgery.  Do not wear lotions, powders, deodorant, or perfumes.   Do not shave below the face/neck 48 hours prior to surgery.   Do not bring  valuables to the hospital.    [facility] is not responsible for any belongings or valuables.               Contacts, dentures or bridgework may not be worn into surgery.  Leave your suitcase in the car. After surgery it may be brought to your room.  For patients admitted to the hospital, discharge time is determined by your treatment team.  __x__ Take these medicines on the morning of surgery:  1. Cetirizine (Zyrtec)  2. Cholecalciferol (Vitamin D)  3. Ferrous Sulfate (Iron)  4. Verapamil (Calan)  5. Sumatriptan (Imitrex) if needed   Skip your Lisinopril on the morning of surgery.  __x__ Follow recommendations from Cardiologist, Pulmonologist or PCP regarding stopping Aspirin, Coumadin, Plavix, Eliquis, Effient, Pradaxa, and Pletal.  __x__ Do not take any Anti-inflammatories such as Advil, Ibuprofen, Motrin, Aleve, Naproxen, Naprosyn, BC/Goodies powders or aspirin products. You may continue to take Tylenol and Celebrex.   __x__ Do not take any over the counter supplements until after surgery. You may continue to take Vitamin D, Vitamin B, and multivitamin.

## 2018-10-28 DIAGNOSIS — I480 Paroxysmal atrial fibrillation: Secondary | ICD-10-CM | POA: Diagnosis not present

## 2018-10-28 DIAGNOSIS — I10 Essential (primary) hypertension: Secondary | ICD-10-CM | POA: Diagnosis not present

## 2018-10-28 DIAGNOSIS — G4700 Insomnia, unspecified: Secondary | ICD-10-CM | POA: Diagnosis not present

## 2018-10-28 DIAGNOSIS — M1712 Unilateral primary osteoarthritis, left knee: Secondary | ICD-10-CM | POA: Diagnosis not present

## 2018-10-28 DIAGNOSIS — M179 Osteoarthritis of knee, unspecified: Secondary | ICD-10-CM | POA: Diagnosis not present

## 2018-10-28 DIAGNOSIS — Z01818 Encounter for other preprocedural examination: Secondary | ICD-10-CM | POA: Diagnosis not present

## 2018-10-28 DIAGNOSIS — M25662 Stiffness of left knee, not elsewhere classified: Secondary | ICD-10-CM | POA: Diagnosis not present

## 2018-10-28 DIAGNOSIS — M25562 Pain in left knee: Secondary | ICD-10-CM | POA: Diagnosis not present

## 2018-10-28 DIAGNOSIS — I2510 Atherosclerotic heart disease of native coronary artery without angina pectoris: Secondary | ICD-10-CM | POA: Diagnosis not present

## 2018-10-29 ENCOUNTER — Other Ambulatory Visit: Payer: Self-pay

## 2018-10-29 ENCOUNTER — Inpatient Hospital Stay
Admission: AD | Admit: 2018-10-29 | Discharge: 2018-10-31 | DRG: 470 | Disposition: A | Discharge status: Home Health | Payer: Medicare HMO | Source: Ambulatory Visit | Attending: Orthopedic Surgery | Admitting: Orthopedic Surgery

## 2018-10-29 ENCOUNTER — Encounter: Payer: Self-pay | Admitting: *Deleted

## 2018-10-29 ENCOUNTER — Encounter
Admission: AD | Disposition: A | Discharge status: Home Health | Payer: Self-pay | Source: Ambulatory Visit | Attending: Orthopedic Surgery

## 2018-10-29 ENCOUNTER — Ambulatory Visit: Payer: Medicare HMO | Admitting: Anesthesiology

## 2018-10-29 ENCOUNTER — Inpatient Hospital Stay: Payer: Medicare HMO

## 2018-10-29 DIAGNOSIS — I4891 Unspecified atrial fibrillation: Secondary | ICD-10-CM | POA: Diagnosis present

## 2018-10-29 DIAGNOSIS — Z79891 Long term (current) use of opiate analgesic: Secondary | ICD-10-CM

## 2018-10-29 DIAGNOSIS — E785 Hyperlipidemia, unspecified: Secondary | ICD-10-CM | POA: Diagnosis present

## 2018-10-29 DIAGNOSIS — M1712 Unilateral primary osteoarthritis, left knee: Principal | ICD-10-CM | POA: Diagnosis present

## 2018-10-29 DIAGNOSIS — Z09 Encounter for follow-up examination after completed treatment for conditions other than malignant neoplasm: Secondary | ICD-10-CM

## 2018-10-29 DIAGNOSIS — Z96652 Presence of left artificial knee joint: Secondary | ICD-10-CM

## 2018-10-29 DIAGNOSIS — I10 Essential (primary) hypertension: Secondary | ICD-10-CM | POA: Diagnosis present

## 2018-10-29 DIAGNOSIS — Z79899 Other long term (current) drug therapy: Secondary | ICD-10-CM | POA: Diagnosis not present

## 2018-10-29 DIAGNOSIS — Z9079 Acquired absence of other genital organ(s): Secondary | ICD-10-CM

## 2018-10-29 DIAGNOSIS — Z87442 Personal history of urinary calculi: Secondary | ICD-10-CM | POA: Diagnosis not present

## 2018-10-29 DIAGNOSIS — G4730 Sleep apnea, unspecified: Secondary | ICD-10-CM | POA: Diagnosis present

## 2018-10-29 DIAGNOSIS — Z471 Aftercare following joint replacement surgery: Secondary | ICD-10-CM | POA: Diagnosis not present

## 2018-10-29 DIAGNOSIS — Z880 Allergy status to penicillin: Secondary | ICD-10-CM | POA: Diagnosis not present

## 2018-10-29 HISTORY — PX: TOTAL KNEE ARTHROPLASTY: SHX125

## 2018-10-29 LAB — ABO/RH: ABO/RH(D): A POS

## 2018-10-29 SURGERY — ARTHROPLASTY, KNEE, TOTAL
Anesthesia: Spinal | Laterality: Left

## 2018-10-29 MED ORDER — MAGNESIUM CITRATE PO SOLN
1.0000 | Freq: Once | ORAL | Status: DC | PRN
  Filled 2018-10-29: qty 296

## 2018-10-29 MED ORDER — FENTANYL CITRATE (PF) 100 MCG/2ML IJ SOLN
INTRAMUSCULAR | Status: AC
  Filled 2018-10-29: qty 2

## 2018-10-29 MED ORDER — CLINDAMYCIN PHOSPHATE 600 MG/50ML IV SOLN
600.0000 mg | Freq: Four times a day (QID) | INTRAVENOUS | Status: AC
  Administered 2018-10-29 (×2): 600 mg via INTRAVENOUS
  Filled 2018-10-29 (×2): qty 50

## 2018-10-29 MED ORDER — OXYCODONE HCL 5 MG PO TABS: 5.0000 mg | ORAL_TABLET | Freq: Once | ORAL | Status: DC | PRN

## 2018-10-29 MED ORDER — METOCLOPRAMIDE HCL 5 MG/ML IJ SOLN: 5.0000 mg | Freq: Three times a day (TID) | INTRAMUSCULAR | Status: DC | PRN

## 2018-10-29 MED ORDER — DOCUSATE SODIUM 100 MG PO CAPS
100.0000 mg | ORAL_CAPSULE | Freq: Two times a day (BID) | ORAL | Status: DC
  Administered 2018-10-29 – 2018-10-31 (×4): 100 mg via ORAL
  Filled 2018-10-29 (×4): qty 1

## 2018-10-29 MED ORDER — HYDROCODONE-ACETAMINOPHEN 5-325 MG PO TABS
1.0000 | ORAL_TABLET | ORAL | Status: DC | PRN
  Administered 2018-10-29 – 2018-10-31 (×3): 2 via ORAL
  Filled 2018-10-29 (×3): qty 2

## 2018-10-29 MED ORDER — FAMOTIDINE 20 MG PO TABS
ORAL_TABLET | ORAL | Status: AC
  Administered 2018-10-29: 20 mg via ORAL
  Filled 2018-10-29: qty 1

## 2018-10-29 MED ORDER — LISINOPRIL 5 MG PO TABS
5.0000 mg | ORAL_TABLET | Freq: Every day | ORAL | Status: DC
  Administered 2018-10-29 – 2018-10-30 (×2): 5 mg via ORAL
  Filled 2018-10-29 (×3): qty 1

## 2018-10-29 MED ORDER — VITAMIN D 25 MCG (1000 UNIT) PO TABS
1000.0000 [IU] | ORAL_TABLET | Freq: Every day | ORAL | Status: DC
  Administered 2018-10-30 – 2018-10-31 (×2): 1000 [IU] via ORAL
  Filled 2018-10-29 (×2): qty 1

## 2018-10-29 MED ORDER — ACETAMINOPHEN 325 MG PO TABS: 325.0000 mg | ORAL_TABLET | Freq: Four times a day (QID) | ORAL | Status: DC | PRN

## 2018-10-29 MED ORDER — MORPHINE SULFATE (PF) 2 MG/ML IV SOLN
2.0000 mg | INTRAVENOUS | Status: DC | PRN
  Administered 2018-10-29 – 2018-10-30 (×3): 2 mg via INTRAVENOUS
  Filled 2018-10-29 (×3): qty 1

## 2018-10-29 MED ORDER — ONDANSETRON HCL 4 MG PO TABS: 4.0000 mg | ORAL_TABLET | Freq: Four times a day (QID) | ORAL | Status: DC | PRN

## 2018-10-29 MED ORDER — VERAPAMIL HCL 40 MG PO TABS
40.0000 mg | ORAL_TABLET | Freq: Two times a day (BID) | ORAL | Status: DC
  Administered 2018-10-29 – 2018-10-30 (×3): 40 mg via ORAL
  Filled 2018-10-29 (×6): qty 1

## 2018-10-29 MED ORDER — SODIUM CHLORIDE 0.9 % IR SOLN
Status: DC | PRN
  Administered 2018-10-29: 1000 mL

## 2018-10-29 MED ORDER — PROPOFOL 500 MG/50ML IV EMUL
INTRAVENOUS | Status: DC | PRN
  Administered 2018-10-29: 50 ug/kg/min via INTRAVENOUS

## 2018-10-29 MED ORDER — ASPIRIN 81 MG PO CHEW
81.0000 mg | CHEWABLE_TABLET | Freq: Two times a day (BID) | ORAL | Status: DC
  Administered 2018-10-29 – 2018-10-31 (×4): 81 mg via ORAL
  Filled 2018-10-29 (×4): qty 1

## 2018-10-29 MED ORDER — GABAPENTIN 300 MG PO CAPS
300.0000 mg | ORAL_CAPSULE | Freq: Three times a day (TID) | ORAL | Status: DC
  Administered 2018-10-29 – 2018-10-31 (×5): 300 mg via ORAL
  Filled 2018-10-29 (×5): qty 1

## 2018-10-29 MED ORDER — TRAMADOL HCL 50 MG PO TABS
50.0000 mg | ORAL_TABLET | Freq: Four times a day (QID) | ORAL | Status: DC
  Administered 2018-10-29 – 2018-10-31 (×8): 50 mg via ORAL
  Filled 2018-10-29 (×8): qty 1

## 2018-10-29 MED ORDER — ZOLPIDEM TARTRATE 5 MG PO TABS
5.0000 mg | ORAL_TABLET | Freq: Every evening | ORAL | Status: DC | PRN
  Administered 2018-10-29 – 2018-10-30 (×2): 5 mg via ORAL
  Filled 2018-10-29 (×2): qty 1

## 2018-10-29 MED ORDER — PROPOFOL 500 MG/50ML IV EMUL
INTRAVENOUS | Status: AC
  Filled 2018-10-29: qty 50

## 2018-10-29 MED ORDER — BISACODYL 10 MG RE SUPP
10.0000 mg | Freq: Every day | RECTAL | Status: DC | PRN
  Administered 2018-10-31: 10 mg via RECTAL
  Filled 2018-10-29: qty 1

## 2018-10-29 MED ORDER — ONDANSETRON HCL 4 MG/2ML IJ SOLN: 4.0000 mg | Freq: Four times a day (QID) | INTRAMUSCULAR | Status: DC | PRN

## 2018-10-29 MED ORDER — FENTANYL CITRATE (PF) 100 MCG/2ML IJ SOLN: 25.0000 ug | INTRAMUSCULAR | Status: DC | PRN

## 2018-10-29 MED ORDER — LORATADINE 10 MG PO TABS
10.0000 mg | ORAL_TABLET | Freq: Every day | ORAL | Status: DC
  Administered 2018-10-30 – 2018-10-31 (×2): 10 mg via ORAL
  Filled 2018-10-29 (×2): qty 1

## 2018-10-29 MED ORDER — MAGNESIUM HYDROXIDE 400 MG/5ML PO SUSP
30.0000 mL | Freq: Every day | ORAL | Status: DC | PRN
  Administered 2018-10-30 – 2018-10-31 (×2): 30 mL via ORAL
  Filled 2018-10-29 (×2): qty 30

## 2018-10-29 MED ORDER — BUPIVACAINE LIPOSOME 1.3 % IJ SUSP: 20.0000 mL | Freq: Once | INTRAMUSCULAR | Status: DC

## 2018-10-29 MED ORDER — BUPIVACAINE HCL (PF) 0.5 % IJ SOLN
INTRAMUSCULAR | Status: DC | PRN
  Administered 2018-10-29: 3 mL

## 2018-10-29 MED ORDER — TRANEXAMIC ACID-NACL 1000-0.7 MG/100ML-% IV SOLN
1000.0000 mg | INTRAVENOUS | Status: AC
  Administered 2018-10-29: 1000 mg via INTRAVENOUS
  Filled 2018-10-29: qty 100

## 2018-10-29 MED ORDER — SODIUM CHLORIDE 0.9 % IV SOLN
INTRAVENOUS | Status: DC | PRN
  Administered 2018-10-29: 60 mL

## 2018-10-29 MED ORDER — BUPIVACAINE HCL (PF) 0.5 % IJ SOLN
INTRAMUSCULAR | Status: AC
  Filled 2018-10-29: qty 10

## 2018-10-29 MED ORDER — MORPHINE SULFATE (PF) 2 MG/ML IV SOLN
0.5000 mg | INTRAVENOUS | Status: DC | PRN
  Administered 2018-10-29: 1 mg via INTRAVENOUS
  Filled 2018-10-29: qty 1

## 2018-10-29 MED ORDER — OXYCODONE HCL 5 MG/5ML PO SOLN: 5.0000 mg | Freq: Once | ORAL | Status: DC | PRN

## 2018-10-29 MED ORDER — CLINDAMYCIN PHOSPHATE 900 MG/50ML IV SOLN
INTRAVENOUS | Status: AC
  Filled 2018-10-29: qty 50

## 2018-10-29 MED ORDER — SUMATRIPTAN SUCCINATE 50 MG PO TABS
100.0000 mg | ORAL_TABLET | ORAL | Status: DC | PRN
  Filled 2018-10-29: qty 2

## 2018-10-29 MED ORDER — BUPIVACAINE-EPINEPHRINE (PF) 0.5% -1:200000 IJ SOLN
INTRAMUSCULAR | Status: DC | PRN
  Administered 2018-10-29: 30 mL

## 2018-10-29 MED ORDER — ACETAMINOPHEN 500 MG PO TABS
1000.0000 mg | ORAL_TABLET | Freq: Once | ORAL | Status: AC
  Administered 2018-10-29: 1000 mg via ORAL

## 2018-10-29 MED ORDER — GABAPENTIN 300 MG PO CAPS
ORAL_CAPSULE | ORAL | Status: AC
  Administered 2018-10-29: 300 mg via ORAL
  Filled 2018-10-29: qty 1

## 2018-10-29 MED ORDER — LACTATED RINGERS IV SOLN
INTRAVENOUS | Status: DC
  Administered 2018-10-29 – 2018-10-30 (×2): via INTRAVENOUS

## 2018-10-29 MED ORDER — PHENOL 1.4 % MT LIQD
1.0000 | OROMUCOSAL | Status: DC | PRN
  Filled 2018-10-29: qty 177

## 2018-10-29 MED ORDER — SODIUM CHLORIDE 0.9 % IV SOLN
INTRAVENOUS | Status: DC | PRN
  Administered 2018-10-29: 20 ug/min via INTRAVENOUS

## 2018-10-29 MED ORDER — ACETAMINOPHEN 500 MG PO TABS
ORAL_TABLET | ORAL | Status: AC
  Administered 2018-10-29: 1000 mg via ORAL
  Filled 2018-10-29: qty 2

## 2018-10-29 MED ORDER — GABAPENTIN 300 MG PO CAPS
300.0000 mg | ORAL_CAPSULE | Freq: Once | ORAL | Status: AC
  Administered 2018-10-29: 300 mg via ORAL

## 2018-10-29 MED ORDER — CLINDAMYCIN PHOSPHATE 900 MG/50ML IV SOLN
900.0000 mg | INTRAVENOUS | Status: AC
  Administered 2018-10-29: 900 mg via INTRAVENOUS

## 2018-10-29 MED ORDER — CHLORHEXIDINE GLUCONATE 4 % EX LIQD: 60.0000 mL | Freq: Once | CUTANEOUS | Status: DC

## 2018-10-29 MED ORDER — PROPOFOL 10 MG/ML IV BOLUS
INTRAVENOUS | Status: DC | PRN
  Administered 2018-10-29: 50 mg via INTRAVENOUS

## 2018-10-29 MED ORDER — KETOROLAC TROMETHAMINE 15 MG/ML IJ SOLN
7.5000 mg | Freq: Four times a day (QID) | INTRAMUSCULAR | Status: AC
  Administered 2018-10-29 – 2018-10-30 (×3): 7.5 mg via INTRAVENOUS
  Filled 2018-10-29 (×3): qty 1

## 2018-10-29 MED ORDER — METOCLOPRAMIDE HCL 10 MG PO TABS: 5.0000 mg | ORAL_TABLET | Freq: Three times a day (TID) | ORAL | Status: DC | PRN

## 2018-10-29 MED ORDER — LACTATED RINGERS IV SOLN
INTRAVENOUS | Status: DC
  Administered 2018-10-29 (×2): via INTRAVENOUS

## 2018-10-29 MED ORDER — FAMOTIDINE 20 MG PO TABS
20.0000 mg | ORAL_TABLET | Freq: Once | ORAL | Status: AC
  Administered 2018-10-29: 20 mg via ORAL

## 2018-10-29 MED ORDER — HYDROCODONE-ACETAMINOPHEN 7.5-325 MG PO TABS
1.0000 | ORAL_TABLET | ORAL | Status: DC | PRN
  Administered 2018-10-29 – 2018-10-30 (×5): 2 via ORAL
  Filled 2018-10-29 (×5): qty 2

## 2018-10-29 MED ORDER — MENTHOL 3 MG MT LOZG
1.0000 | LOZENGE | OROMUCOSAL | Status: DC | PRN
  Filled 2018-10-29: qty 9

## 2018-10-29 MED ORDER — ACETAMINOPHEN 500 MG PO TABS
500.0000 mg | ORAL_TABLET | Freq: Four times a day (QID) | ORAL | Status: AC
  Administered 2018-10-29 – 2018-10-30 (×3): 500 mg via ORAL
  Filled 2018-10-29 (×3): qty 1

## 2018-10-29 SURGICAL SUPPLY — 59 items
BASEPLATE TIBIAL LT SZ6 (Joint) ×2 IMPLANT
BLADE SAW 18WX90L 1.27 THK (BLADE) ×2 IMPLANT
BLADE SAW SAG 25X90X1.19 (BLADE) ×4 IMPLANT
BOWL CEMENT MIX W/ADAPTER (MISCELLANEOUS) ×2 IMPLANT
BRUSH SCRUB EZ  4% CHG (MISCELLANEOUS) ×2
BRUSH SCRUB EZ 4% CHG (MISCELLANEOUS) ×2 IMPLANT
CANISTER SUCT 1200ML W/VALVE (MISCELLANEOUS) ×2 IMPLANT
CANISTER SUCT 3000ML PPV (MISCELLANEOUS) ×4 IMPLANT
CEMENT BONE 1-PACK (Cement) ×4 IMPLANT
CHLORAPREP W/TINT 26ML (MISCELLANEOUS) ×4 IMPLANT
COMP FEMORAL OXINUM SZ7 LEFT (Knees) ×2 IMPLANT
COMP PATELLA GENESIS 35MM (Stem) ×2 IMPLANT
COMPONENT FEMRL OXINUM SZ7 LT (Knees) ×1 IMPLANT
COMPONENT PATELLA GENESIS 35MM (Stem) ×1 IMPLANT
COOLER POLAR GLACIER W/PUMP (MISCELLANEOUS) ×2 IMPLANT
COVER WAND RF STERILE (DRAPES) ×2 IMPLANT
CUFF TOURN 30 STER DUAL PORT (MISCELLANEOUS) ×2 IMPLANT
DRAPE INCISE IOBAN 66X60 STRL (DRAPES) ×2 IMPLANT
DRAPE SHEET LG 3/4 BI-LAMINATE (DRAPES) ×4 IMPLANT
ELECT REM PT RETURN 9FT ADLT (ELECTROSURGICAL) ×2
ELECTRODE REM PT RTRN 9FT ADLT (ELECTROSURGICAL) ×1 IMPLANT
GAUZE PETRO XEROFOAM 1X8 (MISCELLANEOUS) ×2 IMPLANT
GAUZE SPONGE 4X4 12PLY STRL (GAUZE/BANDAGES/DRESSINGS) ×2 IMPLANT
GLOVE BIO SURGEON STRL SZ8 (GLOVE) ×2 IMPLANT
GLOVE BIOGEL PI IND STRL 8.5 (GLOVE) ×1 IMPLANT
GLOVE BIOGEL PI INDICATOR 8.5 (GLOVE) ×1
GLOVE INDICATOR 8.0 STRL GRN (GLOVE) ×2 IMPLANT
GLOVE SURG ORTHO 8.0 STRL STRW (GLOVE) ×6 IMPLANT
GOWN STRL REUS W/ TWL LRG LVL3 (GOWN DISPOSABLE) ×1 IMPLANT
GOWN STRL REUS W/ TWL XL LVL3 (GOWN DISPOSABLE) ×1 IMPLANT
GOWN STRL REUS W/TWL LRG LVL3 (GOWN DISPOSABLE) ×1
GOWN STRL REUS W/TWL XL LVL3 (GOWN DISPOSABLE) ×1
HOOD PEEL AWAY FLYTE STAYCOOL (MISCELLANEOUS) ×6 IMPLANT
INSERT FEM ARTI FLEX 11 SZ 5-6 (Joint) ×2 IMPLANT
IV NS 1000ML (IV SOLUTION) ×1
IV NS 1000ML BAXH (IV SOLUTION) ×1 IMPLANT
KIT TURNOVER KIT A (KITS) ×2 IMPLANT
MAT ABSORB  FLUID 56X50 GRAY (MISCELLANEOUS) ×1
MAT ABSORB FLUID 56X50 GRAY (MISCELLANEOUS) ×1 IMPLANT
NDL SAFETY ECLIPSE 18X1.5 (NEEDLE) ×1 IMPLANT
NEEDLE HYPO 18GX1.5 SHARP (NEEDLE) ×1
NEEDLE SPNL 20GX3.5 QUINCKE YW (NEEDLE) ×2 IMPLANT
NS IRRIG 1000ML POUR BTL (IV SOLUTION) ×2 IMPLANT
PACK TOTAL KNEE (MISCELLANEOUS) ×2 IMPLANT
PAD DE MAYO PRESSURE PROTECT (MISCELLANEOUS) ×2 IMPLANT
PAD WRAPON POLAR KNEE (MISCELLANEOUS) ×1 IMPLANT
PULSAVAC PLUS IRRIG FAN TIP (DISPOSABLE) ×2
STAPLER SKIN PROX 35W (STAPLE) ×2 IMPLANT
SUCTION FRAZIER HANDLE 10FR (MISCELLANEOUS) ×1
SUCTION TUBE FRAZIER 10FR DISP (MISCELLANEOUS) ×1 IMPLANT
SUT DVC 2 QUILL PDO  T11 36X36 (SUTURE) ×1
SUT DVC 2 QUILL PDO T11 36X36 (SUTURE) ×1 IMPLANT
SUT VIC AB 2-0 CT1 18 (SUTURE) ×2 IMPLANT
SUT VIC AB 2-0 CT1 27 (SUTURE)
SUT VIC AB 2-0 CT1 TAPERPNT 27 (SUTURE) IMPLANT
SUT VIC AB PLUS 45CM 1-MO-4 (SUTURE) ×2 IMPLANT
SYR 30ML LL (SYRINGE) ×6 IMPLANT
TIP FAN IRRIG PULSAVAC PLUS (DISPOSABLE) ×1 IMPLANT
WRAPON POLAR PAD KNEE (MISCELLANEOUS) ×2

## 2018-10-29 NOTE — Care Management Note (Signed)
Case Management Note  Patient Details  Name: Ryan Pace MRN: [id number] Date of Birth: [birth year]  Subjective/Objective:                  Met with the patient to discuss DC plan and needs Nurse was in the room admitting the patient, left the [facility] list at the bedside and explained I would come back to review Pharmacy [facility] on [place], PCP is Dr Ginette Pitman Will complete the assessment at a later time   Action/Plan: [facility] list provided to the patient per CMS.gov and a copy placed on the chart, will discuss Choice  Assess need for DME Expected Discharge Date:  10/31/18               Expected Discharge Plan:     In-House Referral:     Discharge planning Services  CM Consult  Post Acute Care Choice:  Home Health Choice offered to:  Patient  DME Arranged:    DME Agency:     HH Arranged:  PT HH Agency:     Status of Service:  In process, will continue to follow  If discussed at Long Length of Stay Meetings, dates discussed:    Additional Comments:  Su Hilt, RN 10/29/2018, 3:52 PM

## 2018-10-29 NOTE — Op Note (Signed)
DATE OF SURGERY:  10/29/2018 TIME: 12:32 PM  PATIENT NAME:  Ryan Pace   AGE: 85 y.o.    PRE-OPERATIVE DIAGNOSIS:  LEFT KNEE OSTEOARTHRITIS  POST-OPERATIVE DIAGNOSIS:  Same  PROCEDURE:  Procedure(s): TOTAL KNEE ARTHROPLASTY-LEFT  SURGEON:  Lovell Sheehan, MD   ASSISTANT:  Carlynn Spry, PA-C  OPERATIVE IMPLANTS: Tamala Julian & Nephew, Cruciate Retaining Oxinium Femoral component size  7, Fixed Bearing Tray size 6, Patella polyethylene 3-peg oval button size 35 mm, with a 11 mm HighFlex insert.   PREOPERATIVE INDICATIONS:  Ryan Pace is an 85 y.o. male who has a diagnosis of LEFT KNEE OSTEOARTHRITIS and elected for a left total knee arthroplasty after failing nonoperative treatment, including activity modification, pain medication, physical therapy and injections who has significant impairment of their activities of daily living.  Radiographs have demonstrated tricompartmental osteoarthritis joint space narrowing, osteophytes, subchondral sclerosis and cyst formation.  The risks, benefits, and alternatives were discussed at length including but not limited to the risks of infection, bleeding, nerve or blood vessel injury, knee stiffness, fracture, dislocation, loosening or failure of the hardware and the need for further surgery. Medical risks include but not limited to DVT and pulmonary embolism, myocardial infarction, stroke, pneumonia, respiratory failure and death. I discussed these risks with the patient in my office prior to the date of surgery. They understood these risks and were willing to proceed.  OPERATIVE FINDINGS AND UNIQUE ASPECTS OF THE CASE:  All three compartments with advanced and severe degenerative changes, large osteophytes and an abundance of synovial fluid. Significant deformity was also noted. A decision was made to proceed with total knee arthroplasty.   OPERATIVE DESCRIPTION:  The patient was brought to the operative room and placed in a supine position  after undergoing placement of a general anesthetic. IV antibiotics were given. Patient received tranexamic acid. The lower extremity was prepped and draped in the usual sterile fashion.  A time out was performed to verify the patient's name, date of birth, medical record number, correct site of surgery and correct procedure to be performed. The timeout was also used to confirm the patient received antibiotics and that appropriate instruments, implants and radiographs studies were available in the room.  The leg was elevated and exsanguinated with an Esmarch and the tourniquet was inflated to 250 mmHg.  A midline incision was made over the left knee.. A medial parapatellar arthrotomy was then made and the patella subluxed laterally and the knee was brought into 90 of flexion. Hoffa's fat pad along with the anterior cruciate ligament was resected and the medial joint line was exposed.  Attention was then turned to preparation of the patella. The thickness of the patella was measured with a caliper, the diameter measured with the patella templates.  The patella resection was then made with an oscillating saw using the patella cutting guide.  The 35 mm button fit appropriately.  3 peg holes for the patella component were then drilled.  The extramedullary tibial cutting guide was then placed using the anterior tibial crest and second ray of the foot as a reference.  The tibial cutting guide was adjusted to allow for appropriate posterior slope.  The tibial cutting block was pinned into position. The slotted stylus was used to measure the proximal tibial resection of 9 mm off the high lateral side. Care was taken during the tibial resection to protect the medial and collateral ligaments.  The resected tibial bone was removed.  The distal femur was resected using  the Visionaire cutting guide.  Care was taken to protect the collateral ligaments during distal femoral resection.  The distal femoral resection was  performed with an oscillating saw. The femoral cutting guide was then removed. Extension gap was measured with a 11 mm spacer block and alignment and extension was confirmed using a long alignment rod. The femur was sized to be a 7. Rotation of the referencing guide was checked with the epicondylar axis and Whitesides line. Then the 4-in-1 cutting jig was then applied to the distal femur. A stylus was used to confirm that the anterior femur would not be notched.   Then the anterior, posterior and chamfer femoral cuts were then made with an oscillating saw.  The knee was distracted and all posterior osteophytes were removed.  The flexion gap was then measured with a flexion spacer block and long alignment rod and was found to be symmetric with the extension gap and perpendicular to mechanical axis of the tibia.  The proximal tibia plateau was then sized with trial trays. The best coverage was achieved with a size 6. This tibial tray was then pinned into position. The proximal tibia was then prepared with the keel punch.  After tibial preparation was completed, all trial components were inserted with polyethylene trials. The knee achieved full extension and flexed to 120 degrees. Ligament were stable to varus and valgus at full extension as well as 30, 60 and 90 degrees of flexion.   The trials were then placed. Knee was taken through a full range of motion and deemed to be stable with the trial components. All trial components were then removed.  The joint was copiously irrigated with pulse lavage.  The final total knee arthroplasty components were then cemented into place. The knee was held in extension while cement was allowed to cure.The knee was taken through a range of motion and the patella tracked well and the knee was again irrigated copiously.  The knee capsule was then injected with Exparel.  The medial arthrotomy was closed with #1 Vicryl and #2 Quill. The subcutaneous tissue closed with  2-0  vicryl, and skin approximated with staples.  A dry sterile and compressive dressing was applied.  A Polar Care was applied to the operative knee.  The patient was awakened and brought to the PACU in stable and satisfactory condition.  All sharp, lap and instrument counts were correct at the conclusion the case. I spoke with the patient's family in the postop consultation room to let them know the case had been performed without complication and the patient was stable in recovery room.   Total tourniquet time was 48 minutes.

## 2018-10-29 NOTE — Anesthesia Procedure Notes (Signed)
Spinal  Patient location during procedure: OR End time: 10/29/2018 10:34 AM Staffing Anesthesiologist: Durenda Hurt, MD Resident/CRNA: Jonna Clark, CRNA Performed: resident/CRNA  Preanesthetic Checklist Completed: patient identified, site marked, surgical consent, pre-op evaluation, timeout performed, IV checked, risks and benefits discussed and monitors and equipment checked Spinal Block Patient position: sitting Prep: Betadine Patient monitoring: heart rate, continuous pulse ox, blood pressure and cardiac monitor Approach: midline Location: L3-4 Injection technique: single-shot Needle Needle type: Whitacre and Introducer  Needle gauge: 24 G Needle length: 9 cm Additional Notes Negative paresthesia. Negative blood return. Positive free-flowing CSF. Expiration date of kit checked and confirmed. Patient tolerated procedure well, without complications.

## 2018-10-29 NOTE — Anesthesia Preprocedure Evaluation (Addendum)
Anesthesia Evaluation  Patient identified by MRN, date of birth, ID band Patient awake    Reviewed: Allergy & Precautions, H&P , NPO status , Patient's Chart, lab work & pertinent test results  Airway Mallampati: III   Neck ROM: full    Dental  (+) Partial Upper   Pulmonary neg pulmonary ROS,           Cardiovascular hypertension, + dysrhythmias Atrial Fibrillation      Neuro/Psych  Headaches, negative psych ROS   GI/Hepatic negative GI ROS, Neg liver ROS,   Endo/Other  negative endocrine ROS  Renal/GU      Musculoskeletal   Abdominal   Peds  Hematology  (+) Blood dyscrasia, anemia ,   Anesthesia Other Findings Past Medical History: No date: Anemia No date: Dysrhythmia     Comment:  atrial fibrillation No date: Headache No date: Hyperlipidemia No date: Kidney stones No date: Prostatitis  Past Surgical History: No date: APPENDECTOMY No date: CARDIAC CATHETERIZATION No date: CATARACT EXTRACTION, BILATERAL No date: COLONOSCOPY     Comment:  1998, 2003, 2011 No date: DUPUYTREN CONTRACTURE RELEASE; Bilateral 01/11/2016: DUPUYTREN CONTRACTURE RELEASE; Right     Comment:  Procedure: DUPUYTREN CONTRACTURE RELEASE;  Surgeon:               Earnestine Leys, MD;  Location: [facility] ORS;  Service:               Orthopedics;  Laterality: Right; No date: FLEXIBLE SIGMOIDOSCOPY     Comment:  1993 02/15/2015: GREEN LIGHT LASER TURP (TRANSURETHRAL RESECTION OF  PROSTATE; N/A     Comment:  Procedure: GREEN LIGHT LASER TURP (TRANSURETHRAL               RESECTION OF PROSTATE;  Surgeon: Royston Cowper, MD;                Location: [facility] ORS;  Service: Urology;  Laterality: N/A; No date: TONSILLECTOMY  BMI    Body Mass Index:  29.20 kg/m      Reproductive/Obstetrics negative OB ROS                            Anesthesia Physical Anesthesia Plan  ASA: III  Anesthesia Plan: Spinal   Post-op Pain  Management:    Induction:   PONV Risk Score and Plan: Propofol infusion  Airway Management Planned: Simple Face Mask  Additional Equipment:   Intra-op Plan:   Post-operative Plan:   Informed Consent: I have reviewed the patients History and Physical, chart, labs and discussed the procedure including the risks, benefits and alternatives for the proposed anesthesia with the patient or authorized representative who has indicated his/her understanding and acceptance.     Dental Advisory Given  Plan Discussed with: Anesthesiologist and CRNA  Anesthesia Plan Comments:        Anesthesia Quick Evaluation

## 2018-10-29 NOTE — Evaluation (Addendum)
Physical Therapy Evaluation Patient Details Name: Ryan Pace MRN: [id number] DOB: Dec 28, 1928 Today's Date: 10/29/2018   History of Present Illness  Pt is an 85 y.o. male s/p L TKA 10/29/18.  PMH includes htn, a-fib, HA's, and B Dupuytren contracture release.  Clinical Impression  Prior to hospital admission, pt was independent with ambulation.  Pt lives with his wife in 1 level home with 2 steps (R railing) to enter.  Currently pt is modified independent semi-supine to sit; min to mod assist to stand from bed; and CGA to min assist to ambulate 40 feet with RW.  0/10 pain beginning of session but L knee pain 6/10 post ambulation (nurse notified of pt's request for pain meds).  Pt would benefit from skilled PT to address noted impairments and functional limitations (see below for any additional details).  Upon hospital discharge, recommend pt discharge with OP PT.    Follow Up Recommendations Outpatient PT    Equipment Recommendations  Rolling walker with 5" wheels    Recommendations for Other Services       Precautions / Restrictions Precautions Precautions: Fall Restrictions Weight Bearing Restrictions: Yes LLE Weight Bearing: Weight bearing as tolerated      Mobility  Bed Mobility Overal bed mobility: Modified Independent             General bed mobility comments: Semi-supine to sit without any noted difficulty  Transfers Overall transfer level: Needs assistance Equipment used: Rolling walker (2 wheeled) Transfers: Sit to/from Stand Sit to Stand: Min assist;Mod assist         General transfer comment: assist to initiate stand and control descent sitting; vc's for UE/LE placement  Ambulation/Gait Ambulation/Gait assistance: Min guard;Min assist Gait Distance (Feet): 40 Feet Assistive device: Rolling walker (2 wheeled) Gait Pattern/deviations: Antalgic Gait velocity: decreased   General Gait Details: decreased stance time L LE; vc's to stay closer to RW;  initially unsteady but improved balance noted with increased distance ambulating  Stairs            Wheelchair Mobility    Modified Rankin (Stroke Patients Only)       Balance Overall balance assessment: Needs assistance Sitting-balance support: No upper extremity supported;Feet supported Sitting balance-Leahy Scale: Normal Sitting balance - Comments: steady sitting reaching within BOS   Standing balance support: Single extremity supported Standing balance-Leahy Scale: Poor Standing balance comment: pt requiring at least single UE support for static standing balance                             Pertinent Vitals/Pain Pain Assessment: 0-10 Pain Score: 6  Pain Location: L knee Pain Descriptors / Indicators: Throbbing Pain Intervention(s): Limited activity within patient's tolerance;Monitored during session;Repositioned;Patient requesting pain meds-RN notified;Other (comment)(polar care applied and activated)     Home Living Family/patient expects to be discharged to:: Private residence Living Arrangements: Spouse/significant other Available Help at Discharge: Family Type of Home: House Home Access: Stairs to enter Entrance Stairs-Rails: Right Entrance Stairs-Number of Steps: 2 Home Layout: One Bell Buckle: Walker - 2 wheels      Prior Function Level of Independence: Independent         Comments: Pt reports no falls in past 6 months.     Hand Dominance   Dominant Hand: Right    Extremity/Trunk Assessment   Upper Extremity Assessment Upper Extremity Assessment: Overall WFL for tasks assessed    Lower Extremity Assessment Lower Extremity Assessment: LLE deficits/detail(R  LE WFL) LLE Deficits / Details: able to perform L LE SLR independently; at least 3/5 knee flexion/extension and DF/PF    Cervical / Trunk Assessment Cervical / Trunk Assessment: Normal  Communication   Communication: No difficulties  Cognition Arousal/Alertness:  Awake/alert Behavior During Therapy: WFL for tasks assessed/performed Overall Cognitive Status: Within Functional Limits for tasks assessed                                        General Comments   Nursing cleared pt for participation in physical therapy.  Pt agreeable to PT session.  Pt's wife present during session.    Exercises Total Joint Exercises Ankle Circles/Pumps: AROM;Strengthening;Both;10 reps;Supine Quad Sets: AROM;Strengthening;Both;10 reps;Supine Heel Slides: AROM;Strengthening;Left;10 reps;Supine Straight Leg Raises: AROM;Strengthening;Left;10 reps;Supine Goniometric ROM: L knee extension 8 degrees short of neutral semi-supine in bed; L knee flexion 115 degrees AROM   Assessment/Plan    PT Assessment Patient needs continued PT services  PT Problem List Decreased strength;Decreased range of motion;Decreased activity tolerance;Decreased balance;Decreased mobility;Decreased knowledge of use of DME;Decreased knowledge of precautions;Pain;Decreased skin integrity       PT Treatment Interventions DME instruction;Gait training;Stair training;Functional mobility training;Therapeutic activities;Therapeutic exercise;Balance training;Patient/family education    PT Goals (Current goals can be found in the Care Plan section)  Acute Rehab PT Goals Patient Stated Goal: to improve mobility PT Goal Formulation: With patient Time For Goal Achievement: 11/12/18 Potential to Achieve Goals: Good    Frequency BID   Barriers to discharge        Co-evaluation               AM-PAC PT "6 Clicks" Mobility  Outcome Measure Help needed turning from your back to your side while in a flat bed without using bedrails?: None Help needed moving from lying on your back to sitting on the side of a flat bed without using bedrails?: None Help needed moving to and from a bed to a chair (including a wheelchair)?: A Little Help needed standing up from a chair using your arms  (e.g., wheelchair or bedside chair)?: A Lot Help needed to walk in hospital room?: A Little Help needed climbing 3-5 steps with a railing? : A Lot 6 Click Score: 18    End of Session Equipment Utilized During Treatment: Gait belt Activity Tolerance: Patient tolerated treatment well Patient left: in chair;with call bell/phone within reach;with chair alarm set;with family/visitor present;with SCD's reapplied;Other (comment)(L heel elevated via towel roll) Nurse Communication: Mobility status;Patient requests pain meds;Precautions;Weight bearing status PT Visit Diagnosis: Other abnormalities of gait and mobility (R26.89);Muscle weakness (generalized) (M62.81);Difficulty in walking, not elsewhere classified (R26.2);Pain Pain - Right/Left: Left Pain - part of body: Knee    Time: 2620-3559 PT Time Calculation (min) (ACUTE ONLY): 30 min   Charges:   PT Evaluation $PT Eval Low Complexity: 1 Low PT Treatments $Therapeutic Exercise: 8-22 mins       Leitha Bleak, PT 10/29/18, 3:50 PM [phone]  Evaluation/treatment session was lead and completed by Dorothy Spark, SPT; directly observed, guided and documented by Leitha Bleak, PT.

## 2018-10-29 NOTE — Transfer of Care (Signed)
Immediate Anesthesia Transfer of Care Note  Patient: Ryan Pace  Procedure(s) Performed: TOTAL KNEE ARTHROPLASTY-LEFT (Left )  Patient Location: PACU  Anesthesia Type:Spinal  Level of Consciousness: drowsy and patient cooperative  Airway & Oxygen Therapy: Patient Spontanous Breathing and Patient connected to nasal cannula oxygen  Post-op Assessment: Report given to RN and Post -op Vital signs reviewed and stable  Post vital signs: Reviewed and stable  Last Vitals:  Vitals Value Taken Time  BP 121/70 10/29/2018 12:33 PM  Temp    Pulse 86 10/29/2018 12:34 PM  Resp 15 10/29/2018 12:34 PM  SpO2 97 % 10/29/2018 12:34 PM  Vitals shown include unvalidated device data.  Last Pain:  Vitals:   10/29/18 0820  TempSrc: Oral  PainSc: 0-No pain         Complications: No apparent anesthesia complications

## 2018-10-29 NOTE — H&P (Signed)
The patient has been re-examined, and the chart reviewed, and there have been no interval changes to the documented history and physical.  Plan a left total knee replacement today.  Anesthesia is consulted regarding a peripheral nerve block for post-operative pain.  The risks, benefits, and alternatives have been discussed at length, and the patient is willing to proceed.     

## 2018-10-29 NOTE — Anesthesia Post-op Follow-up Note (Signed)
Anesthesia QCDR form completed.        

## 2018-10-30 ENCOUNTER — Encounter: Payer: Self-pay | Admitting: Orthopedic Surgery

## 2018-10-30 LAB — BASIC METABOLIC PANEL
Anion gap: 5 (ref 5–15)
BUN: 34 mg/dL — AB (ref 8–23)
CO2: 27 mmol/L (ref 22–32)
Calcium: 8.4 mg/dL — ABNORMAL LOW (ref 8.9–10.3)
Chloride: 102 mmol/L (ref 98–111)
Creatinine, Ser: 1.27 mg/dL — ABNORMAL HIGH (ref 0.61–1.24)
GFR calc Af Amer: 58 mL/min — ABNORMAL LOW (ref >60)
GFR calc non Af Amer: 50 mL/min — ABNORMAL LOW (ref >60)
GLUCOSE: 96 mg/dL (ref 70–99)
Potassium: 4.7 mmol/L (ref 3.5–5.1)
Sodium: 134 mmol/L — ABNORMAL LOW (ref 135–145)

## 2018-10-30 LAB — CBC
HCT: 34.1 % — ABNORMAL LOW (ref 39.0–52.0)
Hemoglobin: 11 g/dL — ABNORMAL LOW (ref 13.0–17.0)
MCH: 30.1 pg (ref 26.0–34.0)
MCHC: 32.3 g/dL (ref 30.0–36.0)
MCV: 93.4 fL (ref 80.0–100.0)
Platelets: 158 10*3/uL (ref 150–400)
RBC: 3.65 MIL/uL — ABNORMAL LOW (ref 4.22–5.81)
RDW: 13.3 % (ref 11.5–15.5)
WBC: 4.9 10*3/uL (ref 4.0–10.5)
nRBC: 0 % (ref 0.0–0.2)

## 2018-10-30 NOTE — Care Management Note (Signed)
Case Management Note  Patient Details  Name: Ryan Pace MRN: [id number] Date of Birth: [birth year]  Subjective/Objective:                  Met with the patient to discuss DC plan and Needs, Patient has all of the DME he needs at home including a RW and rails for commode Patient provided with the [facility] list per CMS.gov Patient would like to use [facility], Notified Helene Kelp via [employer] Patient uses [facility] PCP is Dr. Ginette Pitman, Wife and Son provide transportation   Action/Plan: [facility] list provided per CMS.gov Chose [facility] notified Helene Kelp Expected Discharge Date:  10/31/18               Expected Discharge Plan:     In-House Referral:     Discharge planning Services  CM Consult  Post Acute Care Choice:  Home Health Choice offered to:  Patient  DME Arranged:    DME Agency:     HH Arranged:  PT [place]:  [facility] [employer]  Status of Service:  In process, will continue to follow  If discussed at Long Length of Stay Meetings, dates discussed:    Additional Comments:  Su Hilt, RN 10/30/2018, 9:35 AM

## 2018-10-30 NOTE — Progress Notes (Signed)
Physical Therapy Treatment Patient Details Name: Ryan Pace MRN: [id number] DOB: 1929-04-06 Today's Date: 10/30/2018    History of Present Illness Pt is an 85 y.o. male s/p L TKA 10/29/18.  PMH includes htn, a-fib, HA's, and B Dupuytren contracture release.    PT Comments    Pt able to progress to ambulating 120 feet with RW CGA; vc's given to stay closer to RW and to increase UE support through RW to offweight L LE.  Pt reporting significant L knee pain last night.  Pain L knee 1/10 beginning of session and 2/10 end of session (pt pre-medicated with pain meds).  L knee flexion AROM to 110 degrees.  Will continue to progress pt with strengthening, ROM, and progressive ambulation per pt tolerance.    Follow Up Recommendations  Follow surgeon's recommendation for DC plan and follow-up therapies     Equipment Recommendations  Rolling walker with 5" wheels;3in1 (PT)    Recommendations for Other Services       Precautions / Restrictions Precautions Precautions: Fall Restrictions Weight Bearing Restrictions: Yes LLE Weight Bearing: Weight bearing as tolerated    Mobility  Bed Mobility Overal bed mobility: Modified Independent             General bed mobility comments: Semi-supine to sit without any noted difficulty  Transfers Overall transfer level: Needs assistance Equipment used: Rolling walker (2 wheeled) Transfers: Sit to/from [facility] Sit to Stand: Min guard Stand pivot transfers: Min guard(vc's required for stepping pattern and use of RW; increased time to perform)       General transfer comment: bed mildly elevated; CGA from bed and from recliner (increased effort to stand from bed noted); vc's for UE/LE positioning and overall technique required  Ambulation/Gait Ambulation/Gait assistance: Min guard Gait Distance (Feet): 120 Feet Assistive device: Rolling walker (2 wheeled) Gait Pattern/deviations: Antalgic     General Gait Details:  decreased stance time L LE; vc's to stay closer to RW; vc's to increase UE support through RW to offweight L LE and also stepping technique with RW use; initially step to pattern but progressed to partial step through pattern; vc's to slow down at times   Stairs             Wheelchair Mobility    Modified Rankin (Stroke Patients Only)       Balance Overall balance assessment: Needs assistance Sitting-balance support: No upper extremity supported;Feet supported Sitting balance-Leahy Scale: Normal Sitting balance - Comments: steady sitting reaching outside BOS   Standing balance support: Single extremity supported Standing balance-Leahy Scale: Poor Standing balance comment: pt requiring at least single UE support for static standing balance                            Cognition Arousal/Alertness: Awake/alert Behavior During Therapy: WFL for tasks assessed/performed Overall Cognitive Status: Within Functional Limits for tasks assessed                                        Exercises Total Joint Exercises Ankle Circles/Pumps: AROM;Strengthening;Both;10 reps;Supine Quad Sets: AROM;Strengthening;Both;10 reps;Supine Short Arc Quad: AAROM;Strengthening;Left;10 reps;Supine Heel Slides: AROM;Strengthening;Left;10 reps;Supine Hip ABduction/ADduction: AROM;Strengthening;Left;10 reps;Supine Straight Leg Raises: AROM;Strengthening;Left;10 reps;Supine Goniometric ROM: L knee extension 5 degrees short of neutral semi-supine in bed; L knee flexion 110 degrees AROM    General Comments   Nursing cleared  pt for participation in physical therapy.  Pt agreeable to PT session.  Pt's wife and son present during session.      Pertinent Vitals/Pain Pain Assessment: 0-10 Pain Score: 2  Pain Location: L knee Pain Descriptors / Indicators: Sore Pain Intervention(s): Limited activity within patient's tolerance;Monitored during session;Premedicated before  session;Repositioned;Other (comment)(polar care applied and activated)  Vitals (HR and O2 on room air) stable and WFL throughout treatment session.    Home Living                      Prior Function            PT Goals (current goals can now be found in the care plan section) Acute Rehab PT Goals Patient Stated Goal: to improve mobility PT Goal Formulation: With patient Time For Goal Achievement: 11/12/18 Potential to Achieve Goals: Good Progress towards PT goals: Progressing toward goals    Frequency    BID      PT Plan Discharge plan needs to be updated    Co-evaluation              AM-PAC PT "6 Clicks" Mobility   Outcome Measure  Help needed turning from your back to your side while in a flat bed without using bedrails?: None Help needed moving from lying on your back to sitting on the side of a flat bed without using bedrails?: None Help needed moving to and from a bed to a chair (including a wheelchair)?: A Little Help needed standing up from a chair using your arms (e.g., wheelchair or bedside chair)?: A Little Help needed to walk in hospital room?: A Little Help needed climbing 3-5 steps with a railing? : A Lot 6 Click Score: 19    End of Session Equipment Utilized During Treatment: Gait belt Activity Tolerance: Patient tolerated treatment well Patient left: in chair;with call bell/phone within reach;with chair alarm set;with family/visitor present;with SCD's reapplied;Other (comment)(B heels elevated via towel rolls; polar care in place and activated) Nurse Communication: Mobility status;Precautions;Weight bearing status PT Visit Diagnosis: Other abnormalities of gait and mobility (R26.89);Muscle weakness (generalized) (M62.81);Difficulty in walking, not elsewhere classified (R26.2);Pain Pain - Right/Left: Left Pain - part of body: Knee     Time: 1000-1038 PT Time Calculation (min) (ACUTE ONLY): 38 min  Charges:  $Gait Training: 8-22  mins $Therapeutic Exercise: 8-22 mins $Therapeutic Activity: 8-22 mins                    Leitha Bleak, PT 10/30/18, 12:10 PM [phone]

## 2018-10-30 NOTE — Care Management (Signed)
Sent secure inbox to CMA basket to check benefit for Lovenox

## 2018-10-30 NOTE — Care Management (Signed)
Notified the patient of $80.00 copay for Lovenox

## 2018-10-30 NOTE — Anesthesia Postprocedure Evaluation (Signed)
Anesthesia Post Note  Patient: Ryan Pace  Procedure(s) Performed: TOTAL KNEE ARTHROPLASTY-LEFT (Left )  Patient location during evaluation: Nursing Unit Anesthesia Type: Spinal Level of consciousness: oriented and awake and alert Pain management: pain level controlled Vital Signs Assessment: post-procedure vital signs reviewed and stable Respiratory status: spontaneous breathing and respiratory function stable Cardiovascular status: blood pressure returned to baseline and stable Postop Assessment: no headache, no backache, no apparent nausea or vomiting and patient able to bend at knees Anesthetic complications: no     Last Vitals:  Vitals:   10/30/18 0425 10/30/18 0752  BP: (!) 141/92 136/86  Pulse: 89 88  Resp: 17   Temp: 36.6 C (!) 36.2 C  SpO2: 97% 94%    Last Pain:  Vitals:   10/30/18 0752  TempSrc: Oral  PainSc:                  Brantley Fling

## 2018-10-30 NOTE — Progress Notes (Signed)
Physical Therapy Treatment Patient Details Name: Ryan Pace MRN: [id number] DOB: 1928-10-15 Today's Date: 10/30/2018    History of Present Illness Pt is an 85 y.o. male s/p L TKA 10/29/18.  PMH includes htn, a-fib, HA's, and B Dupuytren contracture release.    PT Comments    Pt able to progress to ambulating around nursing station with RW CGA; initial vc's for gait technique required and intermittent vc's for positioning within walker required.  Pain 2/10 L knee beginning of session at rest but 1/10 end of session resting in bed (pt received pain medications beginning of session).  Will continue to progress pt with strengthening, ROM, ambulation, and trial stairs next session as appropriate.    Follow Up Recommendations  Follow surgeon's recommendation for DC plan and follow-up therapies     Equipment Recommendations  Rolling walker with 5" wheels;3in1 (PT)    Recommendations for Other Services       Precautions / Restrictions Precautions Precautions: Fall Restrictions Weight Bearing Restrictions: Yes LLE Weight Bearing: Weight bearing as tolerated    Mobility  Bed Mobility Overal bed mobility: Modified Independent             General bed mobility comments: Semi-supine to/from sit without any noted difficulty  Transfers Overall transfer level: Needs assistance Equipment used: Rolling walker (2 wheeled) Transfers: Sit to/from Stand Sit to Stand: Min guard         General transfer comment: increased effort to stand from bed at lowest position; CGA for safety; occasional vc's for UE/LE positioning  Ambulation/Gait Ambulation/Gait assistance: Min guard Gait Distance (Feet): 200 Feet Assistive device: Rolling walker (2 wheeled)   Gait velocity: decreased   General Gait Details: mild decreased stance time L LE; intermittent vc's for positioning in walker; vc's to increase UE support through RW to offweight L LE and also stepping technique with RW use initially;  initially step to pattern but progressed to partial step through pattern   Stairs             Wheelchair Mobility    Modified Rankin (Stroke Patients Only)       Balance Overall balance assessment: Needs assistance Sitting-balance support: No upper extremity supported;Feet supported Sitting balance-Leahy Scale: Normal Sitting balance - Comments: steady sitting reaching outside BOS   Standing balance support: Single extremity supported Standing balance-Leahy Scale: Poor Standing balance comment: pt requiring at least single UE support for static standing balance                            Cognition Arousal/Alertness: Awake/alert Behavior During Therapy: WFL for tasks assessed/performed Overall Cognitive Status: Within Functional Limits for tasks assessed                                        Exercises Total Joint Exercises Long Arc Quad: AAROM;Strengthening;Left;10 reps;Seated Knee Flexion: AROM;Strengthening;Left;10 reps;Seated    General Comments  Pt agreeable to PT session.  Pt's wife and son present during session.      Pertinent Vitals/Pain Pain Assessment: 0-10 Pain Score: 1  Pain Descriptors / Indicators: Sore Pain Intervention(s): Limited activity within patient's tolerance;Monitored during session;Repositioned;Patient requesting pain meds-RN notified;RN gave pain meds during session;Other (comment)(polar care applied and activated)    Home Living  Prior Function            PT Goals (current goals can now be found in the care plan section) Acute Rehab PT Goals Patient Stated Goal: to improve mobility PT Goal Formulation: With patient Time For Goal Achievement: 11/12/18 Potential to Achieve Goals: Good Progress towards PT goals: Progressing toward goals    Frequency    BID      PT Plan Current plan remains appropriate    Co-evaluation              AM-PAC PT "6 Clicks"  Mobility   Outcome Measure  Help needed turning from your back to your side while in a flat bed without using bedrails?: None Help needed moving from lying on your back to sitting on the side of a flat bed without using bedrails?: None Help needed moving to and from a bed to a chair (including a wheelchair)?: A Little Help needed standing up from a chair using your arms (e.g., wheelchair or bedside chair)?: A Little Help needed to walk in hospital room?: A Little Help needed climbing 3-5 steps with a railing? : A Little 6 Click Score: 20    End of Session Equipment Utilized During Treatment: Gait belt Activity Tolerance: Patient tolerated treatment well Patient left: in bed;with call bell/phone within reach;with bed alarm set;with family/visitor present;with SCD's reapplied;Other (comment)(B heels elevated via towel rolls; polar care in place and activated) Nurse Communication: Mobility status;Precautions;Weight bearing status PT Visit Diagnosis: Other abnormalities of gait and mobility (R26.89);Muscle weakness (generalized) (M62.81);Difficulty in walking, not elsewhere classified (R26.2);Pain Pain - Right/Left: Left Pain - part of body: Knee     Time: 1340-1427 PT Time Calculation (min) (ACUTE ONLY): 47 min  Charges:  $Gait Training: 8-22 mins $Therapeutic Exercise: 8-22 mins $Therapeutic Activity: 8-22 mins                    Leitha Bleak, PT 10/30/18, 4:24 PM [phone]

## 2018-10-30 NOTE — Progress Notes (Signed)
Clinical Education officer, museum (CSW) received SNF consult. PT is recommending per surgeon recommendations. RN case manager aware of above. Please reconsult if future social work needs arise. CSW signing off.   [employer], LCSW [phone]

## 2018-10-30 NOTE — Progress Notes (Signed)
  Subjective:  Patient reports pain as moderate.    Objective:   VITALS:   Vitals:   10/30/18 0030 10/30/18 0425 10/30/18 0752 10/30/18 1201  BP: (!) 144/90 (!) 141/92 136/86 108/64  Pulse: 91 89 88 86  Resp:  17    Temp: 97.7 F (36.5 C) 97.8 F (36.6 C) (!) 97.2 F (36.2 C) 98.3 F (36.8 C)  TempSrc: Oral Oral Oral Oral  SpO2: 97% 97% 94% 96%  Weight:      Height:        PHYSICAL EXAM:  Neurovascular intact Dorsiflexion/Plantar flexion intact Incision: dressing C/D/I Compartment soft  LABS  Results for orders placed or performed during the hospital encounter of 10/29/18 (from the past 24 hour(s))  Basic metabolic panel     Status: Abnormal   Collection Time: 10/30/18  4:34 AM  Result Value Ref Range   Sodium 134 (L) 135 - 145 mmol/L   Potassium 4.7 3.5 - 5.1 mmol/L   Chloride 102 98 - 111 mmol/L   CO2 27 22 - 32 mmol/L   Glucose, Bld 96 70 - 99 mg/dL   BUN 34 (H) 8 - 23 mg/dL   Creatinine, Ser 1.27 (H) 0.61 - 1.24 mg/dL   Calcium 8.4 (L) 8.9 - 10.3 mg/dL   GFR calc non Af Amer 50 (L) >60 mL/min   GFR calc Af Amer 58 (L) >60 mL/min   Anion gap 5 5 - 15  CBC     Status: Abnormal   Collection Time: 10/30/18  4:34 AM  Result Value Ref Range   WBC 4.9 4.0 - 10.5 K/uL   RBC 3.65 (L) 4.22 - 5.81 MIL/uL   Hemoglobin 11.0 (L) 13.0 - 17.0 g/dL   HCT 34.1 (L) 39.0 - 52.0 %   MCV 93.4 80.0 - 100.0 fL   MCH 30.1 26.0 - 34.0 pg   MCHC 32.3 30.0 - 36.0 g/dL   RDW 13.3 11.5 - 15.5 %   Platelets 158 150 - 400 K/uL   nRBC 0.0 0.0 - 0.2 %    Dg Knee Left Port  Result Date: 10/29/2018 CLINICAL DATA:  Status post left knee arthroplasty. EXAM: PORTABLE LEFT KNEE - 1-2 VIEW COMPARISON:  06/05/2018 FINDINGS: Alignment of a total left knee arthroplasty appears normal. There is some postoperative air and fluid in the joint space. No abnormal lucency surrounding hardware. IMPRESSION: Normal alignment and radiographic appearance of a total left knee arthroplasty. Electronically  Signed   By: Aletta Edouard M.D.   On: 10/29/2018 14:14    Assessment/Plan: 1 Day Post-Op   Active Problems:   S/P TKR (total knee replacement) using cement, left   Up with therapy Discharge home with home health likely tomorrow 81 mg aspirin twice a day for a month   Lovell Sheehan , MD 10/30/2018, 12:14 PM

## 2018-10-30 NOTE — Care Management (Signed)
Spoke with Almyra Free  Lovenox 40 mg. 0.4 ml syringe once daily for 14 days - Not On Formulary MD can call Prior Approval [phone] if Lovenox preferred.  Generic is covered: Enoxaparin No prior approval needed 14 day supply = $80.00 (co-insurance) - MD can write order for exact number of days needed  30 day supply = $255.00 (co-insurance) Deductible met Tier 4 [facility] In network

## 2018-10-31 LAB — CBC
HCT: 32.5 % — ABNORMAL LOW (ref 39.0–52.0)
Hemoglobin: 10.5 g/dL — ABNORMAL LOW (ref 13.0–17.0)
MCH: 30.2 pg (ref 26.0–34.0)
MCHC: 32.3 g/dL (ref 30.0–36.0)
MCV: 93.4 fL (ref 80.0–100.0)
Platelets: 144 10*3/uL — ABNORMAL LOW (ref 150–400)
RBC: 3.48 MIL/uL — ABNORMAL LOW (ref 4.22–5.81)
RDW: 13.4 % (ref 11.5–15.5)
WBC: 5.6 10*3/uL (ref 4.0–10.5)
nRBC: 0 % (ref 0.0–0.2)

## 2018-10-31 MED ORDER — GABAPENTIN 300 MG PO CAPS: 300.0000 mg | ORAL_CAPSULE | Freq: Three times a day (TID) | ORAL | 0 refills | Status: DC

## 2018-10-31 MED ORDER — ASPIRIN 81 MG PO CHEW: 81.0000 mg | CHEWABLE_TABLET | Freq: Two times a day (BID) | ORAL | 1 refills | Status: DC

## 2018-10-31 MED ORDER — DOCUSATE SODIUM 100 MG PO CAPS: 100.0000 mg | ORAL_CAPSULE | Freq: Two times a day (BID) | ORAL | 0 refills | Status: DC

## 2018-10-31 MED ORDER — HYDROCODONE-ACETAMINOPHEN 5-325 MG PO TABS: 1.0000 | ORAL_TABLET | ORAL | 0 refills | Status: AC | PRN

## 2018-10-31 NOTE — Discharge Instructions (Signed)
Continue weight bear as tolerated on the left lower extremity.    Elevate the left lower extremity whenever possible and continue the polar care while elevating the extremity. Patient may shower. No bath or submerging the wound.    Take aspirin as directed for blood clot prevention.  Continue to work on knee range of motion exercises at home as instructed by physical therapy. Continue to use a walker for assistance with ambulation until cleared by physical therapy.  Call [phone] with any questions, such as fever > 101.5 degrees, drainage from the wound or shortness of breath. 

## 2018-10-31 NOTE — Discharge Summary (Signed)
Physician Discharge Summary  Patient ID: Ryan Pace MRN: [id number] DOB/AGE: 83-Feb-1930 83 y.o.  Admit date: 10/29/2018 Discharge date: 10/31/2018  Admission Diagnoses:  LEFT KNEE OSTEOARTHRITIS <principal problem not specified>  Discharge Diagnoses:  LEFT KNEE OSTEOARTHRITIS Active Problems:   S/P TKR (total knee replacement) using cement, left   Past Medical History:  Diagnosis Date  . Anemia   . Dysrhythmia    atrial fibrillation  . Headache   . Hyperlipidemia   . Kidney stones   . Prostatitis     Surgeries: Procedure(s): TOTAL KNEE ARTHROPLASTY-LEFT on 10/29/2018   Consultants (if any):   Discharged Condition: Improved  Hospital Course: Ryan Pace is an 85 y.o. male who was admitted 10/29/2018 with a diagnosis of  LEFT KNEE OSTEOARTHRITIS <principal problem not specified> and went to the operating room on 10/29/2018 and underwent the above named procedures.    He was given perioperative antibiotics:  Anti-infectives (From admission, onward)   Start     Dose/Rate Route Frequency Ordered Stop   10/29/18 1700  clindamycin (CLEOCIN) IVPB 600 mg     600 mg 100 mL/hr over 30 Minutes Intravenous Every 6 hours 10/29/18 1442 10/30/18 0010   10/29/18 1113  50,000 units bacitracin in 0.9% normal saline 250 mL irrigation  Status:  Discontinued       As needed 10/29/18 1113 10/29/18 1228   10/29/18 0815  clindamycin (CLEOCIN) IVPB 900 mg     900 mg 100 mL/hr over 30 Minutes Intravenous On call to O.R. 10/29/18 0803 10/29/18 1115   10/29/18 0753  clindamycin (CLEOCIN) 900 MG/50ML IVPB    Note to Pharmacy:  Garfield Cornea   : cabinet override      10/29/18 0753 10/29/18 1045    .  He was given sequential compression devices, early ambulation, and aspirin for DVT prophylaxis.  He benefited maximally from the hospital stay and there were no complications.    Recent vital signs:  Vitals:   10/30/18 1628 10/30/18 2255  BP: 123/66 119/66  Pulse: 76 81  Resp:  19   Temp: 97.8 F (36.6 C) 98.2 F (36.8 C)  SpO2: 93% 97%    Recent laboratory studies:  Lab Results  Component Value Date   HGB 10.5 (L) 10/31/2018   HGB 11.0 (L) 10/30/2018   HGB 12.3 (L) 10/23/2018   Lab Results  Component Value Date   WBC 5.6 10/31/2018   PLT 144 (L) 10/31/2018   Lab Results  Component Value Date   INR 1.0 10/23/2018   Lab Results  Component Value Date   NA 134 (L) 10/30/2018   K 4.7 10/30/2018   CL 102 10/30/2018   CO2 27 10/30/2018   BUN 34 (H) 10/30/2018   CREATININE 1.27 (H) 10/30/2018   GLUCOSE 96 10/30/2018    Discharge Medications:   Allergies as of 10/31/2018      Reactions   Penicillins Rash   Did it involve swelling of the face/tongue/throat, SOB, or low BP? Unknown Did it involve sudden or severe rash/hives, skin peeling, or any reaction on the inside of your mouth or nose? Unknown Did you need to seek medical attention at a hospital or doctor's office? Unknown When did it last happen?Teenager If all above answers are "NO", may proceed with cephalosporin use.      Medication List    TAKE these medications   aspirin 81 MG chewable tablet Chew 1 tablet (81 mg total) by mouth 2 (two) times daily.   cetirizine  5 MG tablet Commonly known as:  ZYRTEC Take 10 mg by mouth daily.   cholecalciferol 1000 units tablet Commonly known as:  VITAMIN D Take 1,000 Units by mouth daily.   docusate sodium 100 MG capsule Commonly known as:  COLACE Take 1 capsule (100 mg total) by mouth 2 (two) times daily.   ferrous sulfate 325 (65 FE) MG EC tablet Take 325 mg by mouth daily.   HYDROcodone-acetaminophen 5-325 MG tablet Commonly known as:  NORCO/VICODIN Take 1 tablet by mouth every 4 (four) hours as needed for up to 5 days for moderate pain (pain score 4-6).   lisinopril 5 MG tablet Commonly known as:  PRINIVIL,ZESTRIL Take 5 mg by mouth daily.   SUMAtriptan 100 MG tablet Commonly known as:  IMITREX Take 100 mg by mouth every 2  (two) hours as needed for migraine. May repeat in 2 hours if headache persists or recurs.   traZODone 50 MG tablet Commonly known as:  DESYREL Take 100 mg by mouth at bedtime.   verapamil 40 MG tablet Commonly known as:  CALAN Take 40 mg by mouth 2 (two) times daily.   zolpidem 5 MG tablet Commonly known as:  AMBIEN Take 5-10 mg by mouth at bedtime.       Diagnostic Studies: Dg Knee Left Port  Result Date: 10/29/2018 CLINICAL DATA:  Status post left knee arthroplasty. EXAM: PORTABLE LEFT KNEE - 1-2 VIEW COMPARISON:  06/05/2018 FINDINGS: Alignment of a total left knee arthroplasty appears normal. There is some postoperative air and fluid in the joint space. No abnormal lucency surrounding hardware. IMPRESSION: Normal alignment and radiographic appearance of a total left knee arthroplasty. Electronically Signed   By: Aletta Edouard M.D.   On: 10/29/2018 14:14   Vas [place] Lower Extremity Arterial Duplex  Result Date: 10/10/2018 LOWER EXTREMITY ARTERIAL DUPLEX STUDY Indications: Hemarthrosis,Lt Knee.  Current ABI: Not performed Performing Technologist: Almira Coaster RVS  Examination Guidelines: A complete evaluation includes B-mode imaging, spectral Doppler, color Doppler, and power Doppler as needed of all accessible portions of each vessel. Bilateral testing is considered an integral part of a complete examination. Limited examinations for reoccurring indications may be performed as noted.  Left Duplex Findings: +-----------+--------+-----+--------+---------+--------+            PSV cm/sRatioStenosisWaveform Comments +-----------+--------+-----+--------+---------+--------+ CFA Distal 53                   triphasic         +-----------+--------+-----+--------+---------+--------+ DFA        32                   triphasic         +-----------+--------+-----+--------+---------+--------+ SFA Prox   49                   triphasic          +-----------+--------+-----+--------+---------+--------+ SFA Mid    51                   triphasic         +-----------+--------+-----+--------+---------+--------+ SFA Distal 47                   triphasic         +-----------+--------+-----+--------+---------+--------+ POP Distal 26                   triphasic         +-----------+--------+-----+--------+---------+--------+ ATA Distal 99  triphasic         +-----------+--------+-----+--------+---------+--------+ PTA Distal 100                  triphasic         +-----------+--------+-----+--------+---------+--------+ PERO Distal36                   triphasic         +-----------+--------+-----+--------+---------+--------+  Summary: Left: Normal examination. No evidence of arterial occlusive disease. No atypical arterial flow patterns noted in the left popliteal region.  See table(s) above for measurements and observations. Electronically signed by Leotis Pain MD on 10/10/2018 at 2:21:41 PM.    Final     Disposition: Discharge disposition: 01-Home or Self Care            Signed: Carlynn Spry ,MD 10/31/2018, 7:19 AM

## 2018-10-31 NOTE — Evaluation (Signed)
Occupational Therapy Evaluation Patient Details Name: Ryan Pace MRN: [id number] DOB: [birth year] Today's Date: 10/31/2018    History of Present Illness Pt is an 85 y.o. male s/p L TKA 10/29/18.  PMH includes htn, a-fib, HA's, and B Dupuytren contracture release.   Clinical Impression   Pt seen for OT evaluation this date, POD#1 from above surgery. Pt was independent in all ADL prior to surgery and is eager to return to PLOF with less pain and improved safety and independence. Pt denies falls in past 12 months. Pt currently requires min-mod assist for LB dressing and bathing while in seated position due to pain, impaired balance, and limited AROM of L knee. Pt experienced a posterior and to the L loss of balance while performing toilet transfer requiring Mod assist from therapist to correct. Pt/spouse/son instructed in polar care mgt, falls prevention strategies, home/routines modifications, DME/AE for LB bathing and dressing tasks, and bladder mgt strategies. Pt would benefit from skilled OT services including additional instruction in dressing techniques with or without assistive devices for dressing and bathing skills to support recall and carryover prior to discharge and ultimately to maximize safety, independence, and minimize falls risk and caregiver burden. Recommend follow up OT services as ordered by physician and 24/7 supervision/assist upon return home to maximize safety and minimize falls risk. Pt is a high falls risk at this time.    Follow Up Recommendations  Follow surgeon's recommendation for DC plan and follow-up therapies;Supervision/Assistance - 24 hour    Equipment Recommendations  Other (comment);Tub/shower seat(reacher)    Recommendations for Other Services       Precautions / Restrictions Precautions Precautions: Fall Restrictions Weight Bearing Restrictions: Yes LLE Weight Bearing: Weight bearing as tolerated      Mobility Bed Mobility Overal bed mobility:  Modified Independent             General bed mobility comments: Sit > sup without difficulty  Transfers Overall transfer level: Needs assistance Equipment used: Rolling walker (2 wheeled) Transfers: Sit to/from Stand Sit to Stand: Min guard;Min assist         General transfer comment: CGA-Min A for toilet transfer    Balance Overall balance assessment: Needs assistance Sitting-balance support: No upper extremity supported;Feet supported Sitting balance-Leahy Scale: Normal   Postural control: Posterior lean;Left lateral lean Standing balance support: Bilateral upper extremity supported;During functional activity Standing balance-Leahy Scale: Poor Standing balance comment: required BUE support, posterior and to the L lean requiring Mod assist to prevent fall posteriorly back onto BSC frame over toilet                            ADL either performed or assessed with clinical judgement   ADL Overall ADL's : Needs assistance/impaired Eating/Feeding: Sitting;Independent   Grooming: Standing;Min guard;Wash/dry hands;Cueing for safety   Upper Body Bathing: Sitting;Set up;Min guard   Lower Body Bathing: Sit to/from stand;Moderate assistance;Minimal assistance   Upper Body Dressing : Sitting;Min guard;Set up   Lower Body Dressing: Sit to/from stand;Moderate assistance;Minimal assistance   Toilet Transfer: RW;Ambulation;Cueing for safety;Minimal assistance;Moderate assistance Toilet Transfer Details (indicate cue type and reason): Min A to initiate transfer with VC for hand placement on BSC rails, Mod A to correct for LOB posteriorly and to the left once in standing with verbal cues to shift UB weight forward to bring COM over BOS Toileting- Clothing Manipulation and Hygiene: Sitting/lateral lean;Supervision/safety       Functional mobility during ADLs: Min  guard;Minimal assistance;Cueing for safety;Rolling walker       Vision Baseline Vision/History: Wears  glasses Wears Glasses: At all times Patient Visual Report: No change from baseline       Perception     Praxis      Pertinent Vitals/Pain Pain Assessment: 0-10 Pain Score: 2  Pain Location: 2/10 L knee at rest, increasing to 8-9/10 when ambulating following session with PT Pain Descriptors / Indicators: Sore;Aching Pain Intervention(s): Limited activity within patient's tolerance;Monitored during session;Repositioned;Ice applied     Hand Dominance Right   Extremity/Trunk Assessment Upper Extremity Assessment Upper Extremity Assessment: Overall WFL for tasks assessed   Lower Extremity Assessment Lower Extremity Assessment: LLE deficits/detail LLE Deficits / Details: able to perform L LE SLR independently; at least 3/5 knee flexion/extension and DF/PF   Cervical / Trunk Assessment Cervical / Trunk Assessment: Normal   Communication Communication Communication: No difficulties   Cognition Arousal/Alertness: Awake/alert Behavior During Therapy: WFL for tasks assessed/performed Overall Cognitive Status: Within Functional Limits for tasks assessed                                 General Comments: per chart review/PT report, concern for poor memory   General Comments       Exercises Other Exercises Other Exercises: pt/spouse/family instructed in polar care mgt, falls prevention, positioning of L knee at rest, AE/DME for ADL, overnight bladder mgt strategies, and home/routines modifications to maximize safety/independence   Shoulder Instructions      Home Living Family/patient expects to be discharged to:: Private residence Living Arrangements: Spouse/significant other Available Help at Discharge: Family Type of Home: House Home Access: Stairs to enter Technical brewer of Steps: 2 Entrance Stairs-Rails: Right Home Layout: One level     Bathroom Shower/Tub: Occupational psychologist: Standard     Home Equipment: Environmental consultant - 2 wheels           Prior Functioning/Environment Level of Independence: Independent        Comments: Pt reports no falls in past 6 months. Indep in all aspects, enjoys doing puzzles, games on the internet, playing Scrabble with his wife, and gardening         OT Problem List: Decreased strength;Decreased range of motion;Decreased knowledge of use of DME or AE;Decreased safety awareness;Impaired balance (sitting and/or standing);Pain      OT Treatment/Interventions: Self-care/ADL training;Balance training;Therapeutic exercise;Therapeutic activities;DME and/or AE instruction;Patient/family education    OT Goals(Current goals can be found in the care plan section) Acute Rehab OT Goals Patient Stated Goal: to improve mobility and go home OT Goal Formulation: With patient/family Time For Goal Achievement: 11/14/18 Potential to Achieve Goals: Good ADL Goals Pt Will Perform Lower Body Dressing: sit to/from stand;with min assist;with min guard assist;with adaptive equipment Pt Will Transfer to Toilet: with supervision;ambulating(BSC over toilet, LRAD for amb) Additional ADL Goal #1: Pt will independently instruct family in polar care mgt including wear schedule, positioning, donning/doffing. Additional ADL Goal #2: Pt will independently instruct family in compression stocking mgt including wear schedule, positioning, donning/doffing.  OT Frequency: Min 2X/week   Barriers to D/C:            Co-evaluation              AM-PAC OT "6 Clicks" Daily Activity     Outcome Measure Help from another person eating meals?: None Help from another person taking care of personal grooming?: A Little Help  from another person toileting, which includes using toliet, bedpan, or urinal?: A Little Help from another person bathing (including washing, rinsing, drying)?: A Lot Help from another person to put on and taking off regular upper body clothing?: A Little Help from another person to put on and taking off  regular lower body clothing?: A Lot 6 Click Score: 17   End of Session Equipment Utilized During Treatment: Gait belt;Rolling walker  Activity Tolerance: Patient tolerated treatment well Patient left: in bed;with call bell/phone within reach;with bed alarm set;with family/visitor present;with SCD's reapplied;Other (comment)(polar care in place, rolled towel under L ankle)  OT Visit Diagnosis: Other abnormalities of gait and mobility (R26.89);Pain;Muscle weakness (generalized) (M62.81) Pain - Right/Left: Left Pain - part of body: Knee                Time: 6770-3403 OT Time Calculation (min): 27 min Charges:  OT General Charges $OT Visit: 1 Visit OT Evaluation $OT Eval Low Complexity: 1 Low OT Treatments $Self Care/Home Management : 8-22 mins  Jeni Salles, MPH, MS, OTR/L ascom [phone] 10/31/18, 3:42 PM

## 2018-10-31 NOTE — Care Management Important Message (Signed)
Important Message  Patient Details  Name: Ryan Pace MRN: [id number] Date of Birth: Nov 01, 1928   Medicare Important Message Given:  Yes    [name] 10/31/2018, 11:15 AM

## 2018-10-31 NOTE — Progress Notes (Signed)
Physical Therapy Treatment Patient Details Name: Ryan Pace MRN: [id number] DOB: October 01, 1928 Today's Date: 10/31/2018    History of Present Illness Pt is an 85 y.o. male s/p L TKA 10/29/18.  PMH includes htn, a-fib, HA's, and B Dupuytren contracture release.    PT Comments    Pt reporting feeling better than this morning; pt's wife and son present for session.  Pt requiring cueing for standing (to improve balance with standing) during session (pt's son verbalizing good cues to pt to address this).  During static standing with RW, pt appearing steady but then (2 different times during session) pt with sudden loss of balance to L and posterior requiring mod assist of therapist to prevent falls (pt able to react but not able to self correct).  Pt was able to navigate 4 stairs with railing with significant improved technique and balance compared to morning session (CGA x2 for safety) and then ambulate around nursing loop with RW (pt appearing with mild increased L lean during L LE stance time in general (although better than this morning) and pt with 2 mild episodes of unsteadiness posterior/L but pt able to self correct).  Balance issues appearing new today compared to prior sessions (pt's son reporting pt with h/o balance issues but both pt and pt's wife report no h/o of any recent falls).  Family training/education performed with pt's son and pt's wife including balance concerns, vc's for technique with transfers, ambulation, and stairs for safety, and assist levels.  Pt's nurse notified of therapists concern d/t above noted loss of balance in static standing with RW requiring assist of therapist to prevent falls (nurse reporting she would contact MD Harlow Mares regarding these concerns).  Will continue to work with pt on strengthening, ROM, balance, and progressive functional mobility during hospital stay.   Follow Up Recommendations  Follow surgeon's recommendation for DC plan and follow-up  therapies;Supervision/Assistance - 24 hour     Equipment Recommendations  Rolling walker with 5" wheels;3in1 (PT)    Recommendations for Other Services       Precautions / Restrictions Precautions Precautions: Fall Restrictions Weight Bearing Restrictions: Yes LLE Weight Bearing: Weight bearing as tolerated    Mobility  Bed Mobility             General bed mobility comments: Deferred (pt in chair beginning of session and in bathroom with OT end of session)  Transfers Overall transfer level: Needs assistance Equipment used: Rolling walker (2 wheeled) Transfers: Sit to/from Stand Sit to Stand: Min guard;Min assist Stand pivot transfers: Min guard       General transfer comment: pt standing with weight through R heel (posterior BOS) requiring cueing and assist to shift weight forward 1st trial standing (min assist); pt CGA x4 more trials from recliner with intermittent cueing for scooting forward, LE placement, UE placement, and shifting weight forward (pt's son present and able to verbalize these to pt)  Ambulation/Gait Ambulation/Gait assistance: Min guard Gait Distance (Feet): 160 Feet Assistive device: Rolling walker (2 wheeled)   Gait velocity: decreased   General Gait Details: mild decreased stance time L LE; occasional vc's for positioning in walker; partial step through pattern; pt appearing with mild increased L lean during L LE stance time in general (although better than this morning) and pt with 2 mild episodes of unsteadiness posterior/L but pt able to self correct   Stairs Stairs: Yes Stairs assistance: Min guard;+2 physical assistance Stair Management: One rail Left;Sideways;Step to pattern Number of Stairs: 4 General  stair comments: initial vc's and visual demo for technique but then pt able to ascend/descend 4 stairs safely with minimal cueing (significant improvement from morning session)   Wheelchair Mobility    Modified Rankin (Stroke  Patients Only)       Balance Overall balance assessment: Needs assistance Sitting-balance support: No upper extremity supported;Feet supported Sitting balance-Leahy Scale: Normal Sitting balance - Comments: steady sitting reaching outside BOS Postural control: Posterior lean;Left lateral lean Standing balance support: Bilateral upper extremity supported Standing balance-Leahy Scale: Poor Standing balance comment: pt standing static during different parts of session (with B UE's on RW and appearing stable) and pt suddenly with posterior L loss of balance x2 different times in session (pt able to react but unable to catch self requiring mod assist of therapist to prevent 2 falls during session)                            Cognition Arousal/Alertness: Awake/alert Behavior During Therapy: WFL for tasks assessed/performed Overall Cognitive Status: Within Functional Limits for tasks assessed                                 General Comments: Pt appearing with decreased recall      Exercises     General Comments   Nursing cleared pt for participation in physical therapy.  Pt agreeable to PT session.      Pertinent Vitals/Pain Pain Assessment: 0-10 Pain Score: 2  Pain Location: 2/10 L knee at rest, increasing to 8-9/10 when ambulating following session with PT Pain Descriptors / Indicators: Sore;Aching Pain Intervention(s): Limited activity within patient's tolerance;Monitored during session;Repositioned;Ice applied  Vitals (HR and O2 on room air) stable and WFL throughout treatment session.    Home Living Family/patient expects to be discharged to:: Private residence Living Arrangements: Spouse/significant other Available Help at Discharge: Family Type of Home: House Home Access: Stairs to enter Entrance Stairs-Rails: Right Home Layout: One level Home Equipment: Walker - 2 wheels      Prior Function Level of Independence: Independent      Comments:  Pt reports no falls in past 6 months. Indep in all aspects, enjoys doing puzzles, games on the internet, playing Scrabble with his wife, and gardening    PT Goals (current goals can now be found in the care plan section) Acute Rehab PT Goals Patient Stated Goal: to improve mobility and go home PT Goal Formulation: With patient Time For Goal Achievement: 11/12/18 Potential to Achieve Goals: Good Progress towards PT goals: Progressing toward goals    Frequency    BID      PT Plan Current plan remains appropriate    Co-evaluation              AM-PAC PT "6 Clicks" Mobility   Outcome Measure  Help needed turning from your back to your side while in a flat bed without using bedrails?: None Help needed moving from lying on your back to sitting on the side of a flat bed without using bedrails?: None Help needed moving to and from a bed to a chair (including a wheelchair)?: A Little Help needed standing up from a chair using your arms (e.g., wheelchair or bedside chair)?: A Little Help needed to walk in hospital room?: A Lot Help needed climbing 3-5 steps with a railing? : A Little 6 Click Score: 19    End of Session  Equipment Utilized During Treatment: Gait belt Activity Tolerance: Patient tolerated treatment well Patient left: (in bathroom with OT present) Nurse Communication: Mobility status;Precautions;Weight bearing status;Other (comment)(pt with 2 loss of balance during session requiring assist to prevent falls) PT Visit Diagnosis: Other abnormalities of gait and mobility (R26.89);Muscle weakness (generalized) (M62.81);Difficulty in walking, not elsewhere classified (R26.2);Pain Pain - Right/Left: Left Pain - part of body: Knee     Time: 8257-4935 PT Time Calculation (min) (ACUTE ONLY): 28 min  Charges:  $Gait Training: 8-22 mins $Therapeutic Activity: 8-22 mins                    Leitha Bleak, PT 10/31/18, 4:23 PM [phone]

## 2018-10-31 NOTE — Progress Notes (Signed)
Physical Therapy Treatment Patient Details Name: Ryan Pace MRN: [id number] DOB: 1929/06/10 Today's Date: 10/31/2018    History of Present Illness Pt is an 85 y.o. male s/p L TKA 10/29/18.  PMH includes htn, a-fib, HA's, and B Dupuytren contracture release.    PT Comments    Pt reporting 2/10 L knee pain beginning of session at rest in bed.  Pt able to stand from bed with increased effort up to RW and pt initially appearing steady in standing with RW but then pt suddenly loss balance backwards requiring assist of therapist to prevent fall (pt assisted into sitting safely onto bed).  Pt then requiring multiple trials and cueing in order to stand from bed.  Pt then able to ambulate with RW 120 feet but demonstrating increased L lean today in general during L LE stance phase and with 1 loss of balance to L/backwards requiring mod assist to prevent fall (pt using walker from home with wheels on inside so therapist switched front wheels to outside to increase BOS/balance).  Attempted stairs but pt had significant difficulty with cueing and requiring 2 assist to ascend/descend 4 stairs side-stepping with L railing (pt noted with increased R lean descending requiring assist to maintain upright).  After stairs pt's vitals taken with BP 104/59, HR 97 bpm, and O2 sats 99% on room air; pt reporting feeling weak this morning.  Pt also appearing with increased tremors at times (pt and pt's son reports this is typical for pt).  L knee pain 7-8/10 end of session resting in chair.  Nursing notified of above noted concerns and plan to see pt this afternoon for PT session.    Follow Up Recommendations  Follow surgeon's recommendation for DC plan and follow-up therapies;Supervision/Assistance - 24 hour     Equipment Recommendations  Rolling walker with 5" wheels;3in1 (PT)    Recommendations for Other Services       Precautions / Restrictions Precautions Precautions: Fall Restrictions Weight Bearing  Restrictions: Yes LLE Weight Bearing: Weight bearing as tolerated    Mobility  Bed Mobility Overal bed mobility: Modified Independent             General bed mobility comments: Supine to/from sit without any noted difficulty with bed flat  Transfers Overall transfer level: Needs assistance Equipment used: Rolling walker (2 wheeled) Transfers: Sit to/from Stand Sit to Stand: Min guard         General transfer comment: pt initially able to stand with extra effort from bed with CGA for safety; 2nd attempt pt requiring multiple attempts to stand on own and requiring cueing for UE and LE positioning and shifting weight forward in order to stand  Ambulation/Gait Ambulation/Gait assistance: Min guard;Mod assist Gait Distance (Feet): 120 Feet Assistive device: Rolling walker (2 wheeled)   Gait velocity: decreased   General Gait Details: mild decreased stance time L LE; occasional vc's for positioning in walker; partial step through pattern; pt appearing with increased L lean during L LE stance time in general this morning and pt with 1 loss of balance requiring mod assist to prevent fall   Stairs Stairs: Yes Stairs assistance: Min assist;Mod assist;+2 physical assistance Stair Management: One rail Left;Sideways;Step to pattern Number of Stairs: 4 General stair comments: pt initially had significant difficulty with vc's for technique so pt assisted into sitting on mat table and therapist demonstrated via visual demonstration; pt able to perform steps with min to mod assist x2 with consistent cueing for technique but pt demonstrating increased  R lean descending requiring assist to maintain upright and stay safe (even with vc's)   Wheelchair Mobility    Modified Rankin (Stroke Patients Only)       Balance Overall balance assessment: Needs assistance Sitting-balance support: No upper extremity supported;Feet supported Sitting balance-Leahy Scale: Normal Sitting balance -  Comments: steady sitting reaching outside BOS   Standing balance support: Single extremity supported;Bilateral upper extremity supported Standing balance-Leahy Scale: Poor Standing balance comment: pt requiring B UE support for static standing balance; pt with posterior lean in standing requiring max assist to prevent fall backwards and safely sit onto bed                            Cognition Arousal/Alertness: Awake/alert Behavior During Therapy: WFL for tasks assessed/performed                                   General Comments: Pt appearing forgetful with activities (pt reports h/o "poor memory")      Exercises Total Joint Exercises Ankle Circles/Pumps: AROM;Strengthening;Both;10 reps;Supine Quad Sets: AROM;Strengthening;Both;10 reps;Supine Short Arc Quad: AAROM;Strengthening;Left;10 reps;Supine Heel Slides: AROM;Strengthening;Left;10 reps;Supine Hip ABduction/ADduction: AROM;Strengthening;Left;10 reps;Supine Straight Leg Raises: AROM;Strengthening;Left;10 reps;Supine Goniometric ROM: L knee extension 5 degrees short of neutral semi-supine in bed; L knee flexion 110 degrees AROM    General Comments  Pt agreeable to PT session.      Pertinent Vitals/Pain Pain Assessment: 0-10 Pain Score: 8  Pain Location: L knee Pain Descriptors / Indicators: Sore;Aching Pain Intervention(s): Limited activity within patient's tolerance;Monitored during session;Premedicated before session;Repositioned;Other (comment)(polar care applied and activated)     Home Living                      Prior Function            PT Goals (current goals can now be found in the care plan section) Acute Rehab PT Goals Patient Stated Goal: to improve mobility PT Goal Formulation: With patient Time For Goal Achievement: 11/12/18 Potential to Achieve Goals: Good Progress towards PT goals: Not progressing toward goals - comment(balance concerns)    Frequency     BID      PT Plan Current plan remains appropriate    Co-evaluation              AM-PAC PT "6 Clicks" Mobility   Outcome Measure  Help needed turning from your back to your side while in a flat bed without using bedrails?: None Help needed moving from lying on your back to sitting on the side of a flat bed without using bedrails?: None Help needed moving to and from a bed to a chair (including a wheelchair)?: A Little Help needed standing up from a chair using your arms (e.g., wheelchair or bedside chair)?: A Little Help needed to walk in hospital room?: A Lot Help needed climbing 3-5 steps with a railing? : Total 6 Click Score: 17    End of Session Equipment Utilized During Treatment: Gait belt Activity Tolerance: Patient tolerated treatment well Patient left: in chair;with call bell/phone within reach;with chair alarm set;with family/visitor present;with SCD's reapplied;Other (comment)(B heels elevated via towel rolls; polar care in place and activated) Nurse Communication: Mobility status;Precautions;Weight bearing status PT Visit Diagnosis: Other abnormalities of gait and mobility (R26.89);Muscle weakness (generalized) (M62.81);Difficulty in walking, not elsewhere classified (R26.2);Pain Pain - Right/Left: Left Pain - part of  body: Knee     Time: 6803-2122 PT Time Calculation (min) (ACUTE ONLY): 58 min  Charges:  $Gait Training: 23-37 mins $Therapeutic Exercise: 8-22 mins $Therapeutic Activity: 8-22 mins                     Leitha Bleak, PT 10/31/18, 10:40 AM [phone]

## 2018-10-31 NOTE — Progress Notes (Signed)
  Subjective:  Patient reports pain as moderate.    Objective:   VITALS:   Vitals:   10/30/18 0752 10/30/18 1201 10/30/18 1628 10/30/18 2255  BP: 136/86 108/64 123/66 119/66  Pulse: 88 86 76 81  Resp:    19  Temp: (!) 97.2 F (36.2 C) 98.3 F (36.8 C) 97.8 F (36.6 C) 98.2 F (36.8 C)  TempSrc: Oral Oral Oral Oral  SpO2: 94% 96% 93% 97%  Weight:      Height:        PHYSICAL EXAM:  Neurologically intact ABD soft Neurovascular intact Sensation intact distally Intact pulses distally Dorsiflexion/Plantar flexion intact Incision: dressing C/D/I No cellulitis present Compartment soft  LABS  Results for orders placed or performed during the hospital encounter of 10/29/18 (from the past 24 hour(s))  CBC     Status: Abnormal   Collection Time: 10/31/18  5:24 AM  Result Value Ref Range   WBC 5.6 4.0 - 10.5 K/uL   RBC 3.48 (L) 4.22 - 5.81 MIL/uL   Hemoglobin 10.5 (L) 13.0 - 17.0 g/dL   HCT 32.5 (L) 39.0 - 52.0 %   MCV 93.4 80.0 - 100.0 fL   MCH 30.2 26.0 - 34.0 pg   MCHC 32.3 30.0 - 36.0 g/dL   RDW 13.4 11.5 - 15.5 %   Platelets 144 (L) 150 - 400 K/uL   nRBC 0.0 0.0 - 0.2 %    Dg Knee Left Port  Result Date: 10/29/2018 CLINICAL DATA:  Status post left knee arthroplasty. EXAM: PORTABLE LEFT KNEE - 1-2 VIEW COMPARISON:  06/05/2018 FINDINGS: Alignment of a total left knee arthroplasty appears normal. There is some postoperative air and fluid in the joint space. No abnormal lucency surrounding hardware. IMPRESSION: Normal alignment and radiographic appearance of a total left knee arthroplasty. Electronically Signed   By: Aletta Edouard M.D.   On: 10/29/2018 14:14    Assessment/Plan: 2 Days Post-Op   Active Problems:   S/P TKR (total knee replacement) using cement, left   Advance diet Up with therapy  Discharge with HHPT fri or Sat depending on PT goals being met   Carlynn Spry , PA-C 10/31/2018, 7:09 AM

## 2018-10-31 NOTE — Progress Notes (Signed)
Notified Dr. Harlow Mares that physical therapy was concerned that patient had to episodes of losing his balance with the morning and afternoon physical therapy sessions. Patient had balance issues with the stairs this morning but not this afternoon. Patient and patient's son is still wanting the patient to go home today. Wife with some hesitation. Dr. Harlow Mares ok with patient going home. I informed patient not to take a shower unless someone is with him due to his balance issues today. Discharge instructions reviewed with patient, wife and son with verbalization of understanding. Time allowed for questions. Patient was wheeled out via wheelchair by nursing.

## 2018-11-02 DIAGNOSIS — Z79891 Long term (current) use of opiate analgesic: Secondary | ICD-10-CM | POA: Diagnosis not present

## 2018-11-02 DIAGNOSIS — M720 Palmar fascial fibromatosis [Dupuytren]: Secondary | ICD-10-CM | POA: Diagnosis not present

## 2018-11-02 DIAGNOSIS — Z9181 History of falling: Secondary | ICD-10-CM | POA: Diagnosis not present

## 2018-11-02 DIAGNOSIS — I119 Hypertensive heart disease without heart failure: Secondary | ICD-10-CM | POA: Diagnosis not present

## 2018-11-02 DIAGNOSIS — I4891 Unspecified atrial fibrillation: Secondary | ICD-10-CM | POA: Diagnosis not present

## 2018-11-02 DIAGNOSIS — Z471 Aftercare following joint replacement surgery: Secondary | ICD-10-CM | POA: Diagnosis not present

## 2018-11-02 DIAGNOSIS — Z7982 Long term (current) use of aspirin: Secondary | ICD-10-CM | POA: Diagnosis not present

## 2018-11-02 DIAGNOSIS — Z96652 Presence of left artificial knee joint: Secondary | ICD-10-CM | POA: Diagnosis not present

## 2018-11-02 DIAGNOSIS — G4730 Sleep apnea, unspecified: Secondary | ICD-10-CM | POA: Diagnosis not present

## 2018-11-04 DIAGNOSIS — Z9181 History of falling: Secondary | ICD-10-CM | POA: Diagnosis not present

## 2018-11-04 DIAGNOSIS — G4730 Sleep apnea, unspecified: Secondary | ICD-10-CM | POA: Diagnosis not present

## 2018-11-04 DIAGNOSIS — I4891 Unspecified atrial fibrillation: Secondary | ICD-10-CM | POA: Diagnosis not present

## 2018-11-04 DIAGNOSIS — Z96652 Presence of left artificial knee joint: Secondary | ICD-10-CM | POA: Diagnosis not present

## 2018-11-04 DIAGNOSIS — Z79891 Long term (current) use of opiate analgesic: Secondary | ICD-10-CM | POA: Diagnosis not present

## 2018-11-04 DIAGNOSIS — Z7982 Long term (current) use of aspirin: Secondary | ICD-10-CM | POA: Diagnosis not present

## 2018-11-04 DIAGNOSIS — M720 Palmar fascial fibromatosis [Dupuytren]: Secondary | ICD-10-CM | POA: Diagnosis not present

## 2018-11-04 DIAGNOSIS — Z471 Aftercare following joint replacement surgery: Secondary | ICD-10-CM | POA: Diagnosis not present

## 2018-11-04 DIAGNOSIS — I119 Hypertensive heart disease without heart failure: Secondary | ICD-10-CM | POA: Diagnosis not present

## 2018-11-06 DIAGNOSIS — Z9181 History of falling: Secondary | ICD-10-CM | POA: Diagnosis not present

## 2018-11-06 DIAGNOSIS — Z7982 Long term (current) use of aspirin: Secondary | ICD-10-CM | POA: Diagnosis not present

## 2018-11-06 DIAGNOSIS — Z96652 Presence of left artificial knee joint: Secondary | ICD-10-CM | POA: Diagnosis not present

## 2018-11-06 DIAGNOSIS — I119 Hypertensive heart disease without heart failure: Secondary | ICD-10-CM | POA: Diagnosis not present

## 2018-11-06 DIAGNOSIS — Z471 Aftercare following joint replacement surgery: Secondary | ICD-10-CM | POA: Diagnosis not present

## 2018-11-06 DIAGNOSIS — I4891 Unspecified atrial fibrillation: Secondary | ICD-10-CM | POA: Diagnosis not present

## 2018-11-06 DIAGNOSIS — Z79891 Long term (current) use of opiate analgesic: Secondary | ICD-10-CM | POA: Diagnosis not present

## 2018-11-06 DIAGNOSIS — G4730 Sleep apnea, unspecified: Secondary | ICD-10-CM | POA: Diagnosis not present

## 2018-11-06 DIAGNOSIS — M720 Palmar fascial fibromatosis [Dupuytren]: Secondary | ICD-10-CM | POA: Diagnosis not present

## 2018-11-10 DIAGNOSIS — Z96652 Presence of left artificial knee joint: Secondary | ICD-10-CM | POA: Diagnosis not present

## 2018-11-10 DIAGNOSIS — Z79891 Long term (current) use of opiate analgesic: Secondary | ICD-10-CM | POA: Diagnosis not present

## 2018-11-10 DIAGNOSIS — I119 Hypertensive heart disease without heart failure: Secondary | ICD-10-CM | POA: Diagnosis not present

## 2018-11-10 DIAGNOSIS — Z7982 Long term (current) use of aspirin: Secondary | ICD-10-CM | POA: Diagnosis not present

## 2018-11-10 DIAGNOSIS — I4891 Unspecified atrial fibrillation: Secondary | ICD-10-CM | POA: Diagnosis not present

## 2018-11-10 DIAGNOSIS — G4730 Sleep apnea, unspecified: Secondary | ICD-10-CM | POA: Diagnosis not present

## 2018-11-10 DIAGNOSIS — Z9181 History of falling: Secondary | ICD-10-CM | POA: Diagnosis not present

## 2018-11-10 DIAGNOSIS — Z471 Aftercare following joint replacement surgery: Secondary | ICD-10-CM | POA: Diagnosis not present

## 2018-11-10 DIAGNOSIS — M720 Palmar fascial fibromatosis [Dupuytren]: Secondary | ICD-10-CM | POA: Diagnosis not present

## 2018-11-12 DIAGNOSIS — I4891 Unspecified atrial fibrillation: Secondary | ICD-10-CM | POA: Diagnosis not present

## 2018-11-12 DIAGNOSIS — Z471 Aftercare following joint replacement surgery: Secondary | ICD-10-CM | POA: Diagnosis not present

## 2018-11-12 DIAGNOSIS — Z96652 Presence of left artificial knee joint: Secondary | ICD-10-CM | POA: Diagnosis not present

## 2018-11-12 DIAGNOSIS — I119 Hypertensive heart disease without heart failure: Secondary | ICD-10-CM | POA: Diagnosis not present

## 2018-11-12 DIAGNOSIS — M720 Palmar fascial fibromatosis [Dupuytren]: Secondary | ICD-10-CM | POA: Diagnosis not present

## 2018-11-12 DIAGNOSIS — Z79891 Long term (current) use of opiate analgesic: Secondary | ICD-10-CM | POA: Diagnosis not present

## 2018-11-12 DIAGNOSIS — Z9181 History of falling: Secondary | ICD-10-CM | POA: Diagnosis not present

## 2018-11-12 DIAGNOSIS — Z7982 Long term (current) use of aspirin: Secondary | ICD-10-CM | POA: Diagnosis not present

## 2018-11-12 DIAGNOSIS — G4730 Sleep apnea, unspecified: Secondary | ICD-10-CM | POA: Diagnosis not present

## 2018-11-14 DIAGNOSIS — Z7982 Long term (current) use of aspirin: Secondary | ICD-10-CM | POA: Diagnosis not present

## 2018-11-14 DIAGNOSIS — M720 Palmar fascial fibromatosis [Dupuytren]: Secondary | ICD-10-CM | POA: Diagnosis not present

## 2018-11-14 DIAGNOSIS — I119 Hypertensive heart disease without heart failure: Secondary | ICD-10-CM | POA: Diagnosis not present

## 2018-11-14 DIAGNOSIS — Z9181 History of falling: Secondary | ICD-10-CM | POA: Diagnosis not present

## 2018-11-14 DIAGNOSIS — Z79891 Long term (current) use of opiate analgesic: Secondary | ICD-10-CM | POA: Diagnosis not present

## 2018-11-14 DIAGNOSIS — Z471 Aftercare following joint replacement surgery: Secondary | ICD-10-CM | POA: Diagnosis not present

## 2018-11-14 DIAGNOSIS — I4891 Unspecified atrial fibrillation: Secondary | ICD-10-CM | POA: Diagnosis not present

## 2018-11-14 DIAGNOSIS — G4730 Sleep apnea, unspecified: Secondary | ICD-10-CM | POA: Diagnosis not present

## 2018-11-14 DIAGNOSIS — Z96652 Presence of left artificial knee joint: Secondary | ICD-10-CM | POA: Diagnosis not present

## 2018-11-17 DIAGNOSIS — Z96652 Presence of left artificial knee joint: Secondary | ICD-10-CM | POA: Diagnosis not present

## 2018-11-17 DIAGNOSIS — Z471 Aftercare following joint replacement surgery: Secondary | ICD-10-CM | POA: Diagnosis not present

## 2018-11-17 DIAGNOSIS — G4730 Sleep apnea, unspecified: Secondary | ICD-10-CM | POA: Diagnosis not present

## 2018-11-17 DIAGNOSIS — Z79891 Long term (current) use of opiate analgesic: Secondary | ICD-10-CM | POA: Diagnosis not present

## 2018-11-17 DIAGNOSIS — I119 Hypertensive heart disease without heart failure: Secondary | ICD-10-CM | POA: Diagnosis not present

## 2018-11-17 DIAGNOSIS — M720 Palmar fascial fibromatosis [Dupuytren]: Secondary | ICD-10-CM | POA: Diagnosis not present

## 2018-11-17 DIAGNOSIS — I4891 Unspecified atrial fibrillation: Secondary | ICD-10-CM | POA: Diagnosis not present

## 2018-11-17 DIAGNOSIS — Z9181 History of falling: Secondary | ICD-10-CM | POA: Diagnosis not present

## 2018-11-17 DIAGNOSIS — Z7982 Long term (current) use of aspirin: Secondary | ICD-10-CM | POA: Diagnosis not present

## 2018-11-19 DIAGNOSIS — G4730 Sleep apnea, unspecified: Secondary | ICD-10-CM | POA: Diagnosis not present

## 2018-11-19 DIAGNOSIS — M720 Palmar fascial fibromatosis [Dupuytren]: Secondary | ICD-10-CM | POA: Diagnosis not present

## 2018-11-19 DIAGNOSIS — Z7982 Long term (current) use of aspirin: Secondary | ICD-10-CM | POA: Diagnosis not present

## 2018-11-19 DIAGNOSIS — I4891 Unspecified atrial fibrillation: Secondary | ICD-10-CM | POA: Diagnosis not present

## 2018-11-19 DIAGNOSIS — Z9181 History of falling: Secondary | ICD-10-CM | POA: Diagnosis not present

## 2018-11-19 DIAGNOSIS — Z471 Aftercare following joint replacement surgery: Secondary | ICD-10-CM | POA: Diagnosis not present

## 2018-11-19 DIAGNOSIS — Z96652 Presence of left artificial knee joint: Secondary | ICD-10-CM | POA: Diagnosis not present

## 2018-11-19 DIAGNOSIS — Z79891 Long term (current) use of opiate analgesic: Secondary | ICD-10-CM | POA: Diagnosis not present

## 2018-11-19 DIAGNOSIS — I119 Hypertensive heart disease without heart failure: Secondary | ICD-10-CM | POA: Diagnosis not present

## 2018-11-21 DIAGNOSIS — Z96652 Presence of left artificial knee joint: Secondary | ICD-10-CM | POA: Diagnosis not present

## 2018-11-21 DIAGNOSIS — G4730 Sleep apnea, unspecified: Secondary | ICD-10-CM | POA: Diagnosis not present

## 2018-11-21 DIAGNOSIS — Z9181 History of falling: Secondary | ICD-10-CM | POA: Diagnosis not present

## 2018-11-21 DIAGNOSIS — M720 Palmar fascial fibromatosis [Dupuytren]: Secondary | ICD-10-CM | POA: Diagnosis not present

## 2018-11-21 DIAGNOSIS — Z471 Aftercare following joint replacement surgery: Secondary | ICD-10-CM | POA: Diagnosis not present

## 2018-11-21 DIAGNOSIS — Z79891 Long term (current) use of opiate analgesic: Secondary | ICD-10-CM | POA: Diagnosis not present

## 2018-11-21 DIAGNOSIS — Z7982 Long term (current) use of aspirin: Secondary | ICD-10-CM | POA: Diagnosis not present

## 2018-11-21 DIAGNOSIS — I119 Hypertensive heart disease without heart failure: Secondary | ICD-10-CM | POA: Diagnosis not present

## 2018-11-21 DIAGNOSIS — I4891 Unspecified atrial fibrillation: Secondary | ICD-10-CM | POA: Diagnosis not present

## 2018-11-24 DIAGNOSIS — Z471 Aftercare following joint replacement surgery: Secondary | ICD-10-CM | POA: Diagnosis not present

## 2018-11-24 DIAGNOSIS — I4891 Unspecified atrial fibrillation: Secondary | ICD-10-CM | POA: Diagnosis not present

## 2018-11-24 DIAGNOSIS — Z79891 Long term (current) use of opiate analgesic: Secondary | ICD-10-CM | POA: Diagnosis not present

## 2018-11-24 DIAGNOSIS — Z96652 Presence of left artificial knee joint: Secondary | ICD-10-CM | POA: Diagnosis not present

## 2018-11-24 DIAGNOSIS — Z7982 Long term (current) use of aspirin: Secondary | ICD-10-CM | POA: Diagnosis not present

## 2018-11-24 DIAGNOSIS — M720 Palmar fascial fibromatosis [Dupuytren]: Secondary | ICD-10-CM | POA: Diagnosis not present

## 2018-11-24 DIAGNOSIS — G4730 Sleep apnea, unspecified: Secondary | ICD-10-CM | POA: Diagnosis not present

## 2018-11-24 DIAGNOSIS — I119 Hypertensive heart disease without heart failure: Secondary | ICD-10-CM | POA: Diagnosis not present

## 2018-11-24 DIAGNOSIS — Z9181 History of falling: Secondary | ICD-10-CM | POA: Diagnosis not present

## 2018-11-25 DIAGNOSIS — Z471 Aftercare following joint replacement surgery: Secondary | ICD-10-CM | POA: Diagnosis not present

## 2018-11-25 DIAGNOSIS — Z96652 Presence of left artificial knee joint: Secondary | ICD-10-CM | POA: Diagnosis not present

## 2018-11-25 DIAGNOSIS — Z7982 Long term (current) use of aspirin: Secondary | ICD-10-CM | POA: Diagnosis not present

## 2018-11-25 DIAGNOSIS — Z79891 Long term (current) use of opiate analgesic: Secondary | ICD-10-CM | POA: Diagnosis not present

## 2018-11-25 DIAGNOSIS — I119 Hypertensive heart disease without heart failure: Secondary | ICD-10-CM | POA: Diagnosis not present

## 2018-11-25 DIAGNOSIS — M720 Palmar fascial fibromatosis [Dupuytren]: Secondary | ICD-10-CM | POA: Diagnosis not present

## 2018-11-25 DIAGNOSIS — I4891 Unspecified atrial fibrillation: Secondary | ICD-10-CM | POA: Diagnosis not present

## 2018-11-25 DIAGNOSIS — Z9181 History of falling: Secondary | ICD-10-CM | POA: Diagnosis not present

## 2018-11-25 DIAGNOSIS — G4730 Sleep apnea, unspecified: Secondary | ICD-10-CM | POA: Diagnosis not present

## 2018-11-28 DIAGNOSIS — Z96652 Presence of left artificial knee joint: Secondary | ICD-10-CM | POA: Diagnosis not present

## 2018-11-28 DIAGNOSIS — Z9181 History of falling: Secondary | ICD-10-CM | POA: Diagnosis not present

## 2018-11-28 DIAGNOSIS — G4730 Sleep apnea, unspecified: Secondary | ICD-10-CM | POA: Diagnosis not present

## 2018-11-28 DIAGNOSIS — I4891 Unspecified atrial fibrillation: Secondary | ICD-10-CM | POA: Diagnosis not present

## 2018-11-28 DIAGNOSIS — M720 Palmar fascial fibromatosis [Dupuytren]: Secondary | ICD-10-CM | POA: Diagnosis not present

## 2018-11-28 DIAGNOSIS — Z79891 Long term (current) use of opiate analgesic: Secondary | ICD-10-CM | POA: Diagnosis not present

## 2018-11-28 DIAGNOSIS — Z7982 Long term (current) use of aspirin: Secondary | ICD-10-CM | POA: Diagnosis not present

## 2018-11-28 DIAGNOSIS — Z471 Aftercare following joint replacement surgery: Secondary | ICD-10-CM | POA: Diagnosis not present

## 2018-11-28 DIAGNOSIS — I119 Hypertensive heart disease without heart failure: Secondary | ICD-10-CM | POA: Diagnosis not present

## 2018-12-01 DIAGNOSIS — Z471 Aftercare following joint replacement surgery: Secondary | ICD-10-CM | POA: Diagnosis not present

## 2018-12-01 DIAGNOSIS — M720 Palmar fascial fibromatosis [Dupuytren]: Secondary | ICD-10-CM | POA: Diagnosis not present

## 2018-12-01 DIAGNOSIS — Z79891 Long term (current) use of opiate analgesic: Secondary | ICD-10-CM | POA: Diagnosis not present

## 2018-12-01 DIAGNOSIS — Z96652 Presence of left artificial knee joint: Secondary | ICD-10-CM | POA: Diagnosis not present

## 2018-12-01 DIAGNOSIS — Z9181 History of falling: Secondary | ICD-10-CM | POA: Diagnosis not present

## 2018-12-01 DIAGNOSIS — I119 Hypertensive heart disease without heart failure: Secondary | ICD-10-CM | POA: Diagnosis not present

## 2018-12-01 DIAGNOSIS — Z7982 Long term (current) use of aspirin: Secondary | ICD-10-CM | POA: Diagnosis not present

## 2018-12-01 DIAGNOSIS — G4730 Sleep apnea, unspecified: Secondary | ICD-10-CM | POA: Diagnosis not present

## 2018-12-01 DIAGNOSIS — I4891 Unspecified atrial fibrillation: Secondary | ICD-10-CM | POA: Diagnosis not present

## 2018-12-03 DIAGNOSIS — I119 Hypertensive heart disease without heart failure: Secondary | ICD-10-CM | POA: Diagnosis not present

## 2018-12-03 DIAGNOSIS — Z9181 History of falling: Secondary | ICD-10-CM | POA: Diagnosis not present

## 2018-12-03 DIAGNOSIS — I4891 Unspecified atrial fibrillation: Secondary | ICD-10-CM | POA: Diagnosis not present

## 2018-12-03 DIAGNOSIS — Z96652 Presence of left artificial knee joint: Secondary | ICD-10-CM | POA: Diagnosis not present

## 2018-12-03 DIAGNOSIS — M720 Palmar fascial fibromatosis [Dupuytren]: Secondary | ICD-10-CM | POA: Diagnosis not present

## 2018-12-03 DIAGNOSIS — Z471 Aftercare following joint replacement surgery: Secondary | ICD-10-CM | POA: Diagnosis not present

## 2018-12-03 DIAGNOSIS — Z79891 Long term (current) use of opiate analgesic: Secondary | ICD-10-CM | POA: Diagnosis not present

## 2018-12-03 DIAGNOSIS — G4730 Sleep apnea, unspecified: Secondary | ICD-10-CM | POA: Diagnosis not present

## 2018-12-03 DIAGNOSIS — Z7982 Long term (current) use of aspirin: Secondary | ICD-10-CM | POA: Diagnosis not present

## 2018-12-08 DIAGNOSIS — G4730 Sleep apnea, unspecified: Secondary | ICD-10-CM | POA: Diagnosis not present

## 2018-12-08 DIAGNOSIS — Z9181 History of falling: Secondary | ICD-10-CM | POA: Diagnosis not present

## 2018-12-08 DIAGNOSIS — I119 Hypertensive heart disease without heart failure: Secondary | ICD-10-CM | POA: Diagnosis not present

## 2018-12-08 DIAGNOSIS — Z471 Aftercare following joint replacement surgery: Secondary | ICD-10-CM | POA: Diagnosis not present

## 2018-12-08 DIAGNOSIS — Z7982 Long term (current) use of aspirin: Secondary | ICD-10-CM | POA: Diagnosis not present

## 2018-12-08 DIAGNOSIS — Z96652 Presence of left artificial knee joint: Secondary | ICD-10-CM | POA: Diagnosis not present

## 2018-12-08 DIAGNOSIS — Z79891 Long term (current) use of opiate analgesic: Secondary | ICD-10-CM | POA: Diagnosis not present

## 2018-12-08 DIAGNOSIS — M720 Palmar fascial fibromatosis [Dupuytren]: Secondary | ICD-10-CM | POA: Diagnosis not present

## 2018-12-08 DIAGNOSIS — I4891 Unspecified atrial fibrillation: Secondary | ICD-10-CM | POA: Diagnosis not present

## 2018-12-09 DIAGNOSIS — Z96652 Presence of left artificial knee joint: Secondary | ICD-10-CM | POA: Diagnosis not present

## 2018-12-10 DIAGNOSIS — Z79891 Long term (current) use of opiate analgesic: Secondary | ICD-10-CM | POA: Diagnosis not present

## 2018-12-10 DIAGNOSIS — M720 Palmar fascial fibromatosis [Dupuytren]: Secondary | ICD-10-CM | POA: Diagnosis not present

## 2018-12-10 DIAGNOSIS — G4730 Sleep apnea, unspecified: Secondary | ICD-10-CM | POA: Diagnosis not present

## 2018-12-10 DIAGNOSIS — Z9181 History of falling: Secondary | ICD-10-CM | POA: Diagnosis not present

## 2018-12-10 DIAGNOSIS — I4891 Unspecified atrial fibrillation: Secondary | ICD-10-CM | POA: Diagnosis not present

## 2018-12-10 DIAGNOSIS — Z7982 Long term (current) use of aspirin: Secondary | ICD-10-CM | POA: Diagnosis not present

## 2018-12-10 DIAGNOSIS — Z96652 Presence of left artificial knee joint: Secondary | ICD-10-CM | POA: Diagnosis not present

## 2018-12-10 DIAGNOSIS — Z471 Aftercare following joint replacement surgery: Secondary | ICD-10-CM | POA: Diagnosis not present

## 2018-12-10 DIAGNOSIS — I119 Hypertensive heart disease without heart failure: Secondary | ICD-10-CM | POA: Diagnosis not present

## 2019-03-04 DIAGNOSIS — I10 Essential (primary) hypertension: Secondary | ICD-10-CM | POA: Diagnosis not present

## 2019-03-04 DIAGNOSIS — E782 Mixed hyperlipidemia: Secondary | ICD-10-CM | POA: Diagnosis not present

## 2019-03-04 DIAGNOSIS — Z125 Encounter for screening for malignant neoplasm of prostate: Secondary | ICD-10-CM | POA: Diagnosis not present

## 2019-03-04 DIAGNOSIS — D649 Anemia, unspecified: Secondary | ICD-10-CM | POA: Diagnosis not present

## 2019-03-11 DIAGNOSIS — I480 Paroxysmal atrial fibrillation: Secondary | ICD-10-CM | POA: Diagnosis not present

## 2019-03-11 DIAGNOSIS — I2510 Atherosclerotic heart disease of native coronary artery without angina pectoris: Secondary | ICD-10-CM | POA: Diagnosis not present

## 2019-03-11 DIAGNOSIS — Z0000 Encounter for general adult medical examination without abnormal findings: Secondary | ICD-10-CM | POA: Diagnosis not present

## 2019-03-11 DIAGNOSIS — G4700 Insomnia, unspecified: Secondary | ICD-10-CM | POA: Diagnosis not present

## 2019-03-11 DIAGNOSIS — G43009 Migraine without aura, not intractable, without status migrainosus: Secondary | ICD-10-CM | POA: Diagnosis not present

## 2019-03-11 DIAGNOSIS — I10 Essential (primary) hypertension: Secondary | ICD-10-CM | POA: Diagnosis not present

## 2019-03-11 DIAGNOSIS — D649 Anemia, unspecified: Secondary | ICD-10-CM | POA: Diagnosis not present

## 2019-03-11 DIAGNOSIS — N289 Disorder of kidney and ureter, unspecified: Secondary | ICD-10-CM | POA: Diagnosis not present

## 2019-04-14 DIAGNOSIS — I2510 Atherosclerotic heart disease of native coronary artery without angina pectoris: Secondary | ICD-10-CM | POA: Diagnosis not present

## 2019-04-14 DIAGNOSIS — I340 Nonrheumatic mitral (valve) insufficiency: Secondary | ICD-10-CM | POA: Diagnosis not present

## 2019-04-14 DIAGNOSIS — I480 Paroxysmal atrial fibrillation: Secondary | ICD-10-CM | POA: Diagnosis not present

## 2019-04-14 DIAGNOSIS — E782 Mixed hyperlipidemia: Secondary | ICD-10-CM | POA: Diagnosis not present

## 2019-04-14 DIAGNOSIS — I10 Essential (primary) hypertension: Secondary | ICD-10-CM | POA: Diagnosis not present

## 2019-05-06 DIAGNOSIS — Q6689 Other  specified congenital deformities of feet: Secondary | ICD-10-CM | POA: Diagnosis not present

## 2019-05-06 DIAGNOSIS — M779 Enthesopathy, unspecified: Secondary | ICD-10-CM | POA: Diagnosis not present

## 2019-05-06 DIAGNOSIS — M79671 Pain in right foot: Secondary | ICD-10-CM | POA: Diagnosis not present

## 2019-06-03 IMAGING — DX DG CHEST 1V PORT
1 series · 1 of 1 positions shown · non-contrast
Comparison: 11/14/07

CLINICAL DATA: Weakness

EXAM:
PORTABLE CHEST 1 VIEW

[chest ap]
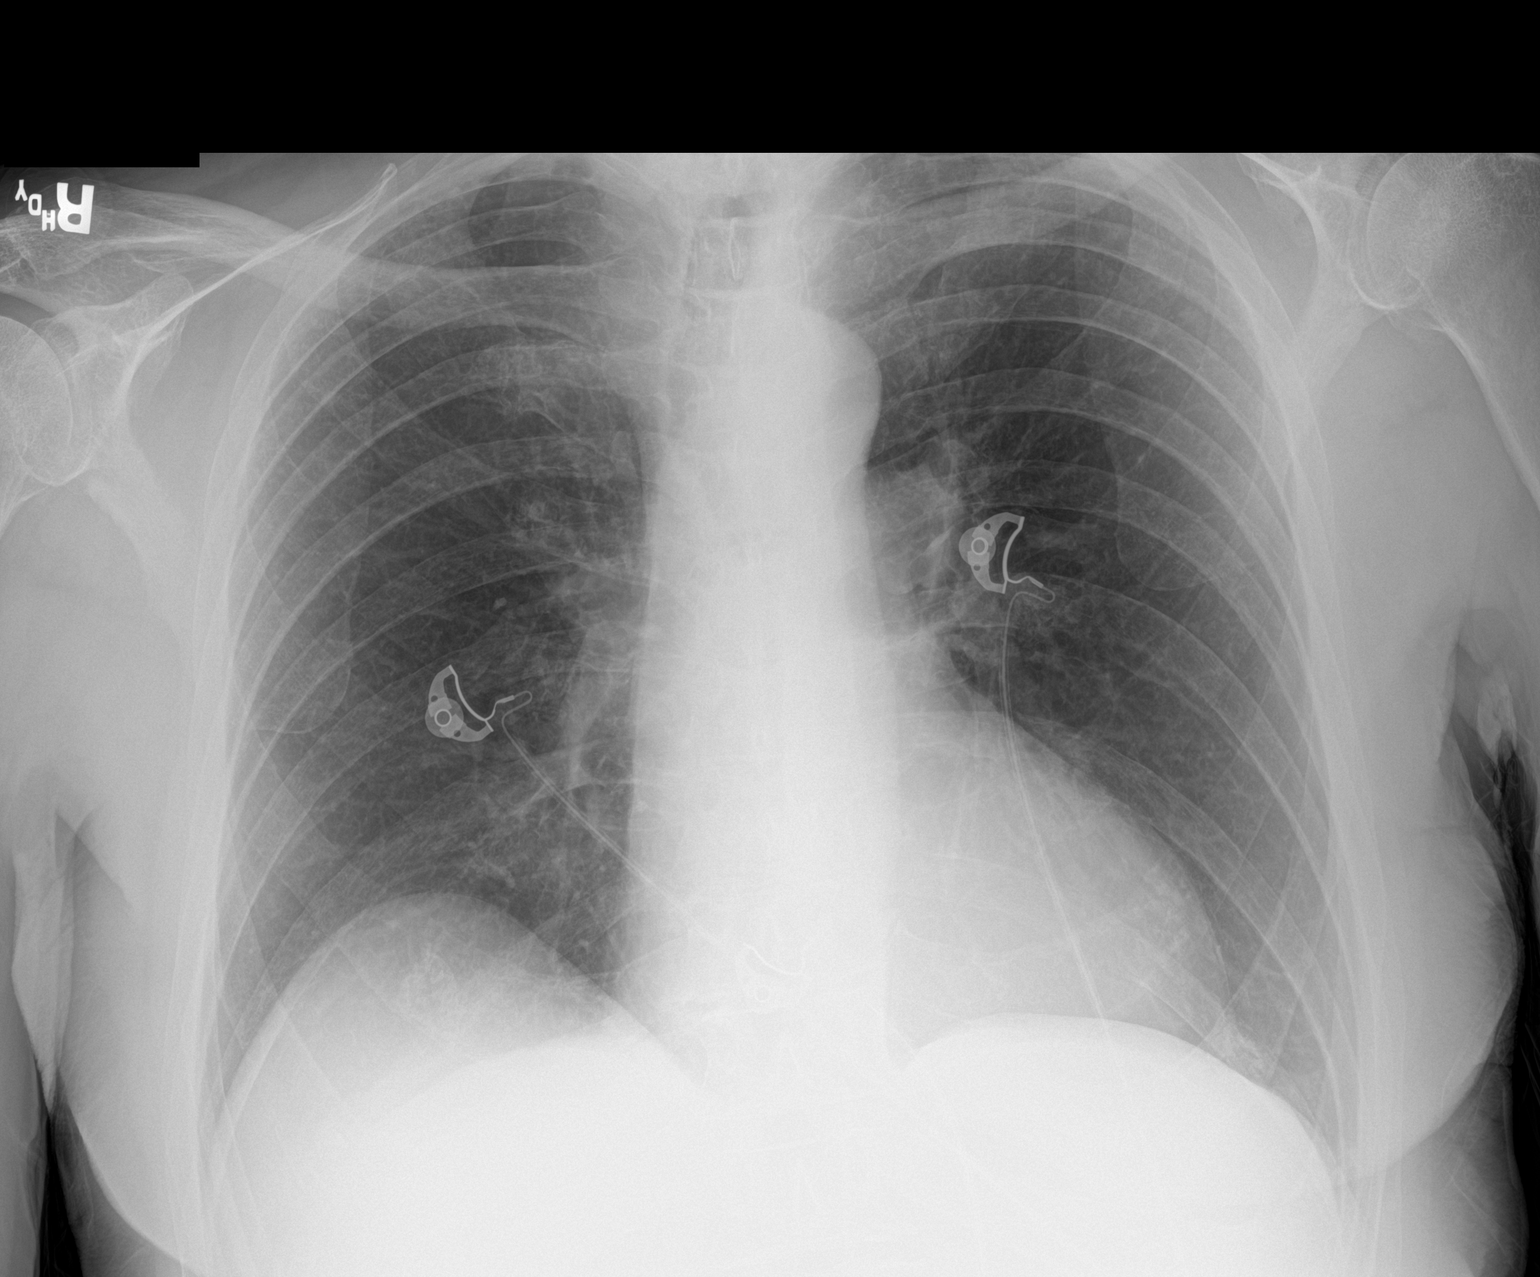

[1 of 1 positions shown; findings below may reference images not displayed]

FINDINGS: Cardiac shadows within normal limits. The lungs are well aerated
bilaterally. No focal infiltrate or sizable effusion is seen. No
bony abnormality is noted.
IMPRESSION: No acute abnormality noted.

## 2019-06-22 DIAGNOSIS — R0600 Dyspnea, unspecified: Secondary | ICD-10-CM | POA: Diagnosis not present

## 2019-06-22 DIAGNOSIS — R0602 Shortness of breath: Secondary | ICD-10-CM | POA: Diagnosis not present

## 2019-06-22 DIAGNOSIS — R0789 Other chest pain: Secondary | ICD-10-CM | POA: Diagnosis not present

## 2019-06-22 DIAGNOSIS — I480 Paroxysmal atrial fibrillation: Secondary | ICD-10-CM | POA: Diagnosis not present

## 2019-07-02 DIAGNOSIS — R0600 Dyspnea, unspecified: Secondary | ICD-10-CM | POA: Diagnosis not present

## 2019-07-02 DIAGNOSIS — R0602 Shortness of breath: Secondary | ICD-10-CM | POA: Diagnosis not present

## 2019-07-02 DIAGNOSIS — R0789 Other chest pain: Secondary | ICD-10-CM | POA: Diagnosis not present

## 2019-07-06 DIAGNOSIS — E782 Mixed hyperlipidemia: Secondary | ICD-10-CM | POA: Diagnosis not present

## 2019-07-06 DIAGNOSIS — I2510 Atherosclerotic heart disease of native coronary artery without angina pectoris: Secondary | ICD-10-CM | POA: Diagnosis not present

## 2019-07-06 DIAGNOSIS — R0602 Shortness of breath: Secondary | ICD-10-CM | POA: Diagnosis not present

## 2019-07-06 DIAGNOSIS — I10 Essential (primary) hypertension: Secondary | ICD-10-CM | POA: Diagnosis not present

## 2019-07-06 DIAGNOSIS — I480 Paroxysmal atrial fibrillation: Secondary | ICD-10-CM | POA: Diagnosis not present

## 2019-07-06 DIAGNOSIS — I340 Nonrheumatic mitral (valve) insufficiency: Secondary | ICD-10-CM | POA: Diagnosis not present

## 2019-07-21 DIAGNOSIS — I2510 Atherosclerotic heart disease of native coronary artery without angina pectoris: Secondary | ICD-10-CM | POA: Diagnosis not present

## 2019-07-21 DIAGNOSIS — I340 Nonrheumatic mitral (valve) insufficiency: Secondary | ICD-10-CM | POA: Diagnosis not present

## 2019-07-21 DIAGNOSIS — I480 Paroxysmal atrial fibrillation: Secondary | ICD-10-CM | POA: Diagnosis not present

## 2019-07-21 DIAGNOSIS — E782 Mixed hyperlipidemia: Secondary | ICD-10-CM | POA: Diagnosis not present

## 2019-07-21 DIAGNOSIS — I10 Essential (primary) hypertension: Secondary | ICD-10-CM | POA: Diagnosis not present

## 2019-07-27 DIAGNOSIS — I10 Essential (primary) hypertension: Secondary | ICD-10-CM | POA: Diagnosis not present

## 2019-07-27 DIAGNOSIS — E782 Mixed hyperlipidemia: Secondary | ICD-10-CM | POA: Diagnosis not present

## 2019-07-27 DIAGNOSIS — I480 Paroxysmal atrial fibrillation: Secondary | ICD-10-CM | POA: Diagnosis not present

## 2019-07-27 DIAGNOSIS — I2510 Atherosclerotic heart disease of native coronary artery without angina pectoris: Secondary | ICD-10-CM | POA: Diagnosis not present

## 2019-09-14 DIAGNOSIS — I2510 Atherosclerotic heart disease of native coronary artery without angina pectoris: Secondary | ICD-10-CM | POA: Diagnosis not present

## 2019-09-14 DIAGNOSIS — D649 Anemia, unspecified: Secondary | ICD-10-CM | POA: Diagnosis not present

## 2019-09-14 DIAGNOSIS — I480 Paroxysmal atrial fibrillation: Secondary | ICD-10-CM | POA: Diagnosis not present

## 2019-09-14 DIAGNOSIS — G43009 Migraine without aura, not intractable, without status migrainosus: Secondary | ICD-10-CM | POA: Diagnosis not present

## 2019-09-14 DIAGNOSIS — I10 Essential (primary) hypertension: Secondary | ICD-10-CM | POA: Diagnosis not present

## 2019-09-14 DIAGNOSIS — G4700 Insomnia, unspecified: Secondary | ICD-10-CM | POA: Diagnosis not present

## 2019-09-21 DIAGNOSIS — I480 Paroxysmal atrial fibrillation: Secondary | ICD-10-CM | POA: Diagnosis not present

## 2019-09-21 DIAGNOSIS — G43909 Migraine, unspecified, not intractable, without status migrainosus: Secondary | ICD-10-CM | POA: Diagnosis not present

## 2019-09-21 DIAGNOSIS — Z0000 Encounter for general adult medical examination without abnormal findings: Secondary | ICD-10-CM | POA: Diagnosis not present

## 2019-09-21 DIAGNOSIS — G4700 Insomnia, unspecified: Secondary | ICD-10-CM | POA: Diagnosis not present

## 2019-09-21 DIAGNOSIS — R251 Tremor, unspecified: Secondary | ICD-10-CM | POA: Diagnosis not present

## 2019-09-21 DIAGNOSIS — N3281 Overactive bladder: Secondary | ICD-10-CM | POA: Diagnosis not present

## 2019-09-21 DIAGNOSIS — I10 Essential (primary) hypertension: Secondary | ICD-10-CM | POA: Diagnosis not present

## 2019-09-21 DIAGNOSIS — Z96652 Presence of left artificial knee joint: Secondary | ICD-10-CM | POA: Diagnosis not present

## 2019-09-21 DIAGNOSIS — E785 Hyperlipidemia, unspecified: Secondary | ICD-10-CM | POA: Diagnosis not present

## 2019-10-13 DIAGNOSIS — J300 Vasomotor rhinitis: Secondary | ICD-10-CM | POA: Diagnosis not present

## 2019-10-13 DIAGNOSIS — H6123 Impacted cerumen, bilateral: Secondary | ICD-10-CM | POA: Diagnosis not present

## 2019-10-13 DIAGNOSIS — H903 Sensorineural hearing loss, bilateral: Secondary | ICD-10-CM | POA: Diagnosis not present

## 2019-10-24 IMAGING — DX DG KNEE 1-2V PORT*L*
2 series · 2 of 2 positions shown · non-contrast
Comparison: 06/05/2018

CLINICAL DATA: Status post left knee arthroplasty.

EXAM:
PORTABLE LEFT KNEE - 1-2 VIEW

[knee ap]
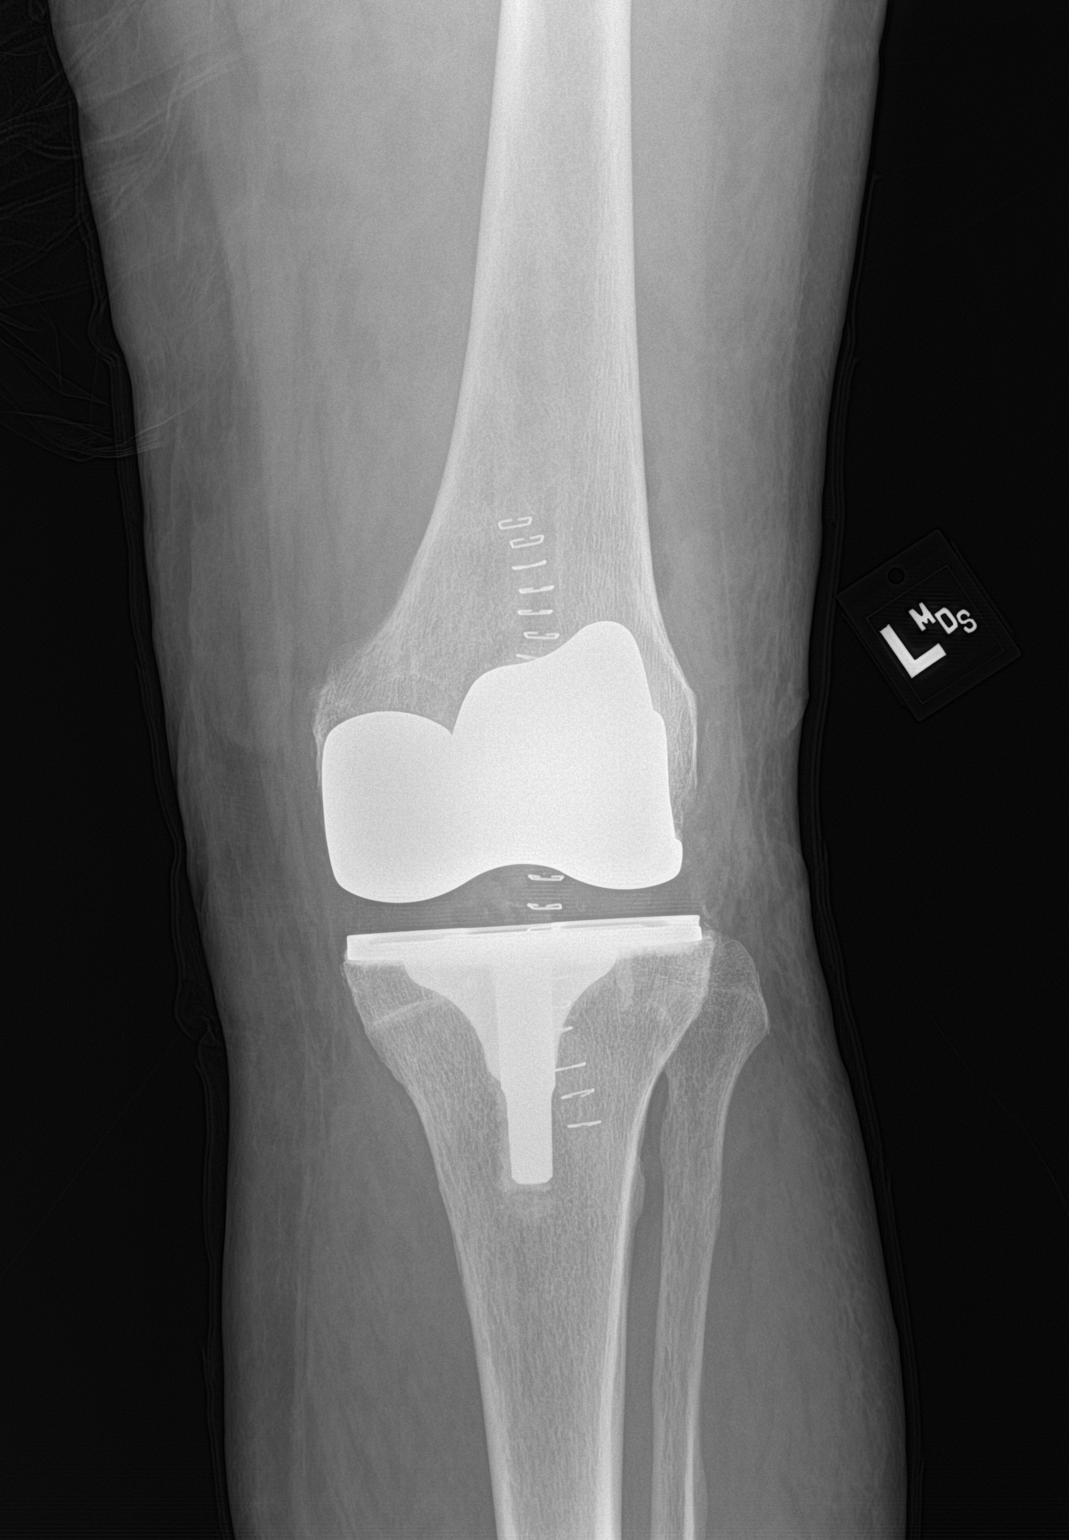

[knee lat]
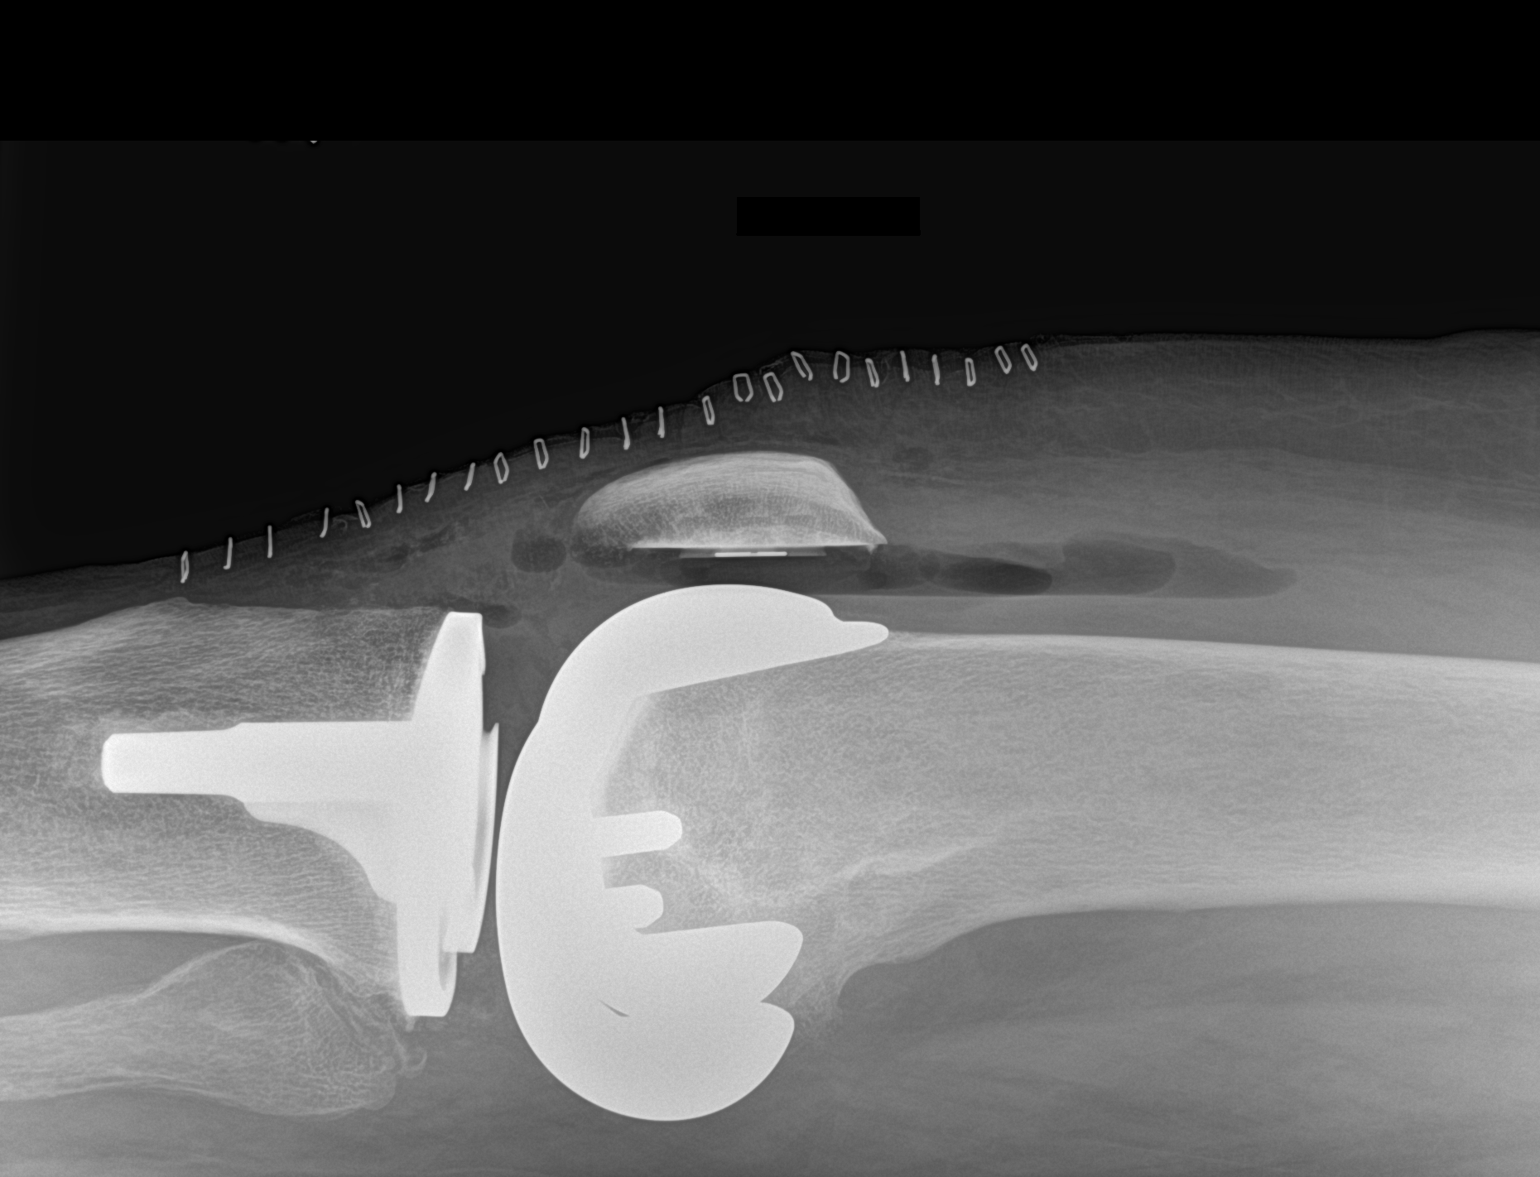

[2 of 2 positions shown; findings below may reference images not displayed]

FINDINGS: Alignment of a total left knee arthroplasty appears normal. There is
some postoperative air and fluid in the joint space. No abnormal
lucency surrounding hardware.
IMPRESSION: Normal alignment and radiographic appearance of a total left knee
arthroplasty.

## 2019-10-27 ENCOUNTER — Ambulatory Visit: Payer: Medicare HMO | Admitting: Urology

## 2019-10-27 ENCOUNTER — Encounter: Payer: Self-pay | Admitting: Urology

## 2019-10-27 ENCOUNTER — Other Ambulatory Visit: Payer: Self-pay

## 2019-10-27 VITALS — BP 104/72 | HR 63 | Ht 69.0 in | Wt 204.0 lb

## 2019-10-27 DIAGNOSIS — R3129 Other microscopic hematuria: Secondary | ICD-10-CM

## 2019-10-27 LAB — URINALYSIS, COMPLETE
Bilirubin, UA: NEGATIVE
Glucose, UA: NEGATIVE
Ketones, UA: NEGATIVE
Leukocytes,UA: NEGATIVE
Nitrite, UA: NEGATIVE
Protein,UA: NEGATIVE
Specific Gravity, UA: 1.025 (ref 1.005–1.030)
Urobilinogen, Ur: 0.2 mg/dL (ref 0.2–1.0)
pH, UA: 6 (ref 5.0–7.5)

## 2019-10-27 LAB — MICROSCOPIC EXAMINATION: Bacteria, UA: NONE SEEN

## 2019-10-27 NOTE — Patient Instructions (Signed)

## 2019-10-27 NOTE — Progress Notes (Signed)
10/27/2019 10:40 AM   Bing Matter Nov 01, 1928 [id number]  Referring provider: Tracie Harrier, MD [address] [facility] [place],  [place] [address]  Chief Complaint  Patient presents with  . Hematuria    HPI: Ryan Pace is a 86 yo M who presents today for the evaluation and management of hematuria. He was referred to [place] by Tracie Harrier, MD.   He has a personal history of BPH and bladder stone laser treatment in 2011. He is currently on Eliquis. He has  tried several medications including Flomax and Myrbetriq. He is not currently on BPH meds.   Previously followed up by Dr. Eliberto Ivory. Had [facility] Laser 2016  He denies gross hematuria. Night time x2, not bothersome. No incontinence. Reports of kidney stones years ago. Non-smoker but chewed tobacco when he was young.   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 Denies pain in kidneys but reports of intermittent pain some mornings but not bothersome.   PSA 0.8 on 7/20. Creatine 1.5 on 1/21, stable and baseline.   UA 09/14/19 showed 10-50 RBC, also seen in 2019 otherwise unremarkable.   PMH: Past Medical History:  Diagnosis Date  . Anemia   . Dysrhythmia    atrial fibrillation  . Headache   . Hyperlipidemia   . Kidney stones   . Prostatitis     Surgical History: Past Surgical History:  Procedure Laterality Date  . APPENDECTOMY    . CARDIAC CATHETERIZATION    . CATARACT EXTRACTION, BILATERAL    . COLONOSCOPY     1998, 2003, 2011  . DUPUYTREN CONTRACTURE RELEASE Bilateral   . DUPUYTREN CONTRACTURE RELEASE Right 01/11/2016   Procedure: DUPUYTREN CONTRACTURE RELEASE;  Surgeon: Earnestine Leys, MD;  Location: [facility] ORS;  Service: Orthopedics;  Laterality: Right;  . [place]  . GREEN LIGHT LASER TURP (TRANSURETHRAL RESECTION OF PROSTATE N/A 02/15/2015   Procedure: GREEN LIGHT LASER TURP (TRANSURETHRAL RESECTION OF PROSTATE;  Surgeon:  Royston Cowper, MD;  Location: [facility];  Service: Urology;  Laterality: N/A;  . TONSILLECTOMY    . TOTAL KNEE ARTHROPLASTY Left 10/29/2018   Procedure: TOTAL KNEE ARTHROPLASTY-LEFT;  Surgeon: Lovell Sheehan, MD;  Location: [facility] OR[facility];  Service: Orthopedics;  Laterality: Left;    Home Medications:  Allergies as of 10/27/2019      Reactions   Penicillins Rash   Did it involve swelling of the face/tongue/throat, SOB, or low BP? Unknown Did it involve sudden or severe rash/hives, skin peeling, or any reaction on the inside of your mouth or nose? Unknown Did you need to seek medical attention at a hospital or doctor's office? Unknown When did it last happen?Teenager If all above answers are "NO", may proceed with cephalosporin use.      Medication List       Accurate as of October 27, 2019 11:59 PM. If you have any questions, ask your nurse or doctor.        STOP taking these medications   aspirin 81 MG chewable tablet Stopped by: Hollice Espy, MD   docusate sodium 100 MG capsule Commonly known as: COLACE Stopped by: Hollice Espy, MD   SUMAtriptan 100 MG tablet Commonly known as: IMITREX Stopped by: Hollice Espy, MD     TAKE these medications   apixaban 5 MG Tabs tablet Commonly known as: ELIQUIS Take by mouth.   cetirizine 5 MG tablet Commonly known as: ZYRTEC Take 10  mg by mouth daily.   cholecalciferol 1000 units tablet Commonly known as: VITAMIN D Take 1,000 Units by mouth daily.   ferrous sulfate 325 (65 FE) MG EC tablet Take 325 mg by mouth daily.   lisinopril 5 MG tablet Commonly known as: ZESTRIL Take 5 mg by mouth daily.   metoprolol tartrate 25 MG tablet Commonly known as: LOPRESSOR TAKE 1 TABLET BY MOUTH TWICE A DAY   traZODone 50 MG tablet Commonly known as: DESYREL Take 100 mg by mouth at bedtime.   verapamil 40 MG tablet Commonly known as: CALAN Take 40 mg by mouth 2 (two) times daily.   zolpidem 5 MG tablet Commonly known as:  AMBIEN Take 5-10 mg by mouth at bedtime.       Allergies:  Allergies  Allergen Reactions  . Penicillins Rash    Did it involve swelling of the face/tongue/throat, SOB, or low BP? Unknown Did it involve sudden or severe rash/hives, skin peeling, or any reaction on the inside of your mouth or nose? Unknown Did you need to seek medical attention at a hospital or doctor's office? Unknown When did it last happen?Teenager If all above answers are "NO", may proceed with cephalosporin use.     Family History: History reviewed. No pertinent family history.  Social History:  reports that he has never smoked. He has quit using smokeless tobacco. He reports that he does not drink alcohol or use drugs.   Physical Exam: BP 104/72   Pulse 63   Ht 5\' 9"  (1.753 m)   Wt 204 lb (92.5 kg)   BMI 30.13 kg/m   Constitutional:  Alert and oriented, No acute distress. HEENT: [place] AT, moist mucus membranes.  Trachea midline, no masses. Cardiovascular: No clubbing, cyanosis, or edema. Respiratory: Normal respiratory effort, no increased work of breathing. Skin: No rashes, bruises or suspicious lesions. Neurologic: Grossly intact, no focal deficits, moving all 4 extremities. Psychiatric: Normal mood and affect.  Laboratory Data: Cr 1.5 09/14/19 Care everywhere PSA 0.81 7/20  Urinalysis 3-10 RBC urine today, see epic  Assessment & Plan:    1. Microscopic hematuria  Given age and multiple comorbidities, pt at higher risk We discussed the differential diagnosis for  hematuria including nephrolithiasis, renal or upper tract tumors, bladder stones, UTIs, or bladder tumors as well as undetermined etiologies. Per AUA guidelines, I did recommend complete microscopic hematuria evaluation including CTU, possible urine cytology, and office cystoscopy. Given his age and comorbitides, he is agreeable to [place] intervening when finding anything pathologic and would like to pursue workup    2. History of  BPH Minimally symptomatic at this time May be contributing factor with anti-coagualtion due to #1   3. Nocturia Minimal bothersome x2 Not currently on medication  Return in about 4 weeks (around 11/24/2019) for CT scan, cysto.  [place] [address], [place], [place] [address] [phone]  I, Lucas Mallow, am acting as a scribe for Dr. Hollice Espy,  I have reviewed the above documentation for accuracy and completeness, and I agree with the above.   Hollice Espy, MD

## 2019-10-29 DIAGNOSIS — T161XXA Foreign body in right ear, initial encounter: Secondary | ICD-10-CM | POA: Diagnosis not present

## 2019-10-29 DIAGNOSIS — H903 Sensorineural hearing loss, bilateral: Secondary | ICD-10-CM | POA: Diagnosis not present

## 2019-11-16 ENCOUNTER — Ambulatory Visit
Admission: RE | Admit: 2019-11-16 | Discharge: 2019-11-16 | Disposition: A | Discharge status: Home / Self Care | Payer: Medicare HMO | Source: Ambulatory Visit | Attending: Urology | Admitting: Urology

## 2019-11-16 ENCOUNTER — Other Ambulatory Visit: Payer: Self-pay

## 2019-11-16 DIAGNOSIS — R3129 Other microscopic hematuria: Secondary | ICD-10-CM | POA: Diagnosis not present

## 2019-11-16 DIAGNOSIS — N281 Cyst of kidney, acquired: Secondary | ICD-10-CM | POA: Diagnosis not present

## 2019-11-16 DIAGNOSIS — N200 Calculus of kidney: Secondary | ICD-10-CM | POA: Diagnosis not present

## 2019-11-16 LAB — POCT I-STAT CREATININE: Creatinine, Ser: 1.5 mg/dL — ABNORMAL HIGH (ref 0.61–1.24)

## 2019-11-16 MED ORDER — IOHEXOL 300 MG/ML  SOLN
100.0000 mL | Freq: Once | INTRAMUSCULAR | Status: AC | PRN
  Administered 2019-11-16: 150 mL via INTRAVENOUS

## 2019-11-30 NOTE — Progress Notes (Signed)
12/01/19  CC:  Chief Complaint  Patient presents with  . Cysto    HPI: Ryan Pace is a 86 y.o. M with a hx of hematuria returns today for a cystoscopy.   He has a personal history of BPH and bladder stone laser treatment in 2011. He is currently on Eliquis. He has  tried several medications including Flomax and Myrbetriq. He is not currently on BPH meds.   Previously followed up by Dr. Eliberto Ivory. Had [facility]ne Rice Laser 2016  UA 09/14/19 showed 10-50 RBC, also seen in 2019 otherwise unremarkable. UA 10/27/19 showed 3-10 RBC.   CT from 11/16/19 indicated bilateral nonobstructing renal stones more so on the right than left and bilateral simple renal cysts with some tiny lesions in both kidneys.   Creatinine 1.50, 11/16/19.   He reports of low right back pain otherwise no complaints.   Vitals:   12/01/19 1407  BP: 135/78  Pulse: (!) 58   NED. A&Ox3.   No respiratory distress   Abd soft, NT, ND Normal phallus with bilateral descended testicles  Cystoscopy Procedure Note  Patient identification was confirmed, informed consent was obtained, and patient was prepped using Betadine solution.  Lidocaine jelly was administered per urethral meatus.     Pre-Procedure: - Inspection reveals a normal caliber ureteral meatus.  Procedure: The flexible cystoscope was introduced without difficulty - No urethral strictures/lesions are present. - Normal prostate with some regrowth within TURP defect  - Normal bladder neck - Bilateral ureteral orifices identified - Bladder mucosa  reveals no ulcers, tumors, or lesions - 2 mm bladder stone indicated and removed with withdrawal of scope  - Mild trabeculation  Retroflexion shows 2 mm bladder stone.   Post-Procedure: - Patient tolerated the procedure well  Pertinent Imagings: CLINICAL DATA:  Microscopic hematuria.  EXAM: CT ABDOMEN AND PELVIS WITHOUT AND WITH CONTRAST  TECHNIQUE: Multidetector CT imaging of the abdomen and pelvis  was performed following the standard protocol before and following the bolus administration of intravenous contrast.  CONTRAST:  142mL OMNIPAQUE IOHEXOL 300 MG/ML  SOLN  COMPARISON:  None.  FINDINGS: Lower chest: Emphysema.  Hepatobiliary: 6 mm hypodensity identified medial segment left liver, too small to characterize. There is no evidence for gallstones, gallbladder wall thickening, or pericholecystic fluid. No intrahepatic or extrahepatic biliary dilation.  Pancreas: No focal mass lesion. No dilatation of the main duct. No intraparenchymal cyst. No peripancreatic edema.  Spleen: No splenomegaly. No focal mass lesion.  Adrenals/Urinary Tract: No adrenal nodule or mass.  Precontrast imaging shows 2 stones in the right kidney, measuring 6 x 7 mm in the interpolar region and 1.2 x 1.2 x 1.2 cm in the lower pole. No right ureteral stone. 3 punctate 1-2 mm stones are seen in the left kidney, in the interpolar region and best appreciated on coronal images 79, 83, and 84. No left ureteral stone. No bladder stone 3 mm stone identified at the bladder base adjacent to the urethral origin (image 76/2 appears to be extraluminal.  Imaging after IV contrast administration simple cysts in the right kidney measure up to 2.8 cm. Simple cysts in the left kidney measure up to 2.3 cm. There are some tiny lesions in both kidneys that are too small to characterize. No overtly suspicious enhancing renal mass.  Delayed imaging shows no wall thickening or soft tissue filling defect in either intrarenal collecting system or renal pelvis. Both ureters are relatively well opacified and show no wall thickening or filling defect. No mass  lesion along the length of either ureter. No focal bladder wall abnormality evident.  Stomach/Bowel: Stomach is unremarkable. No gastric wall thickening. No evidence of outlet obstruction. Duodenum is normally positioned as is the ligament of Treitz. No  small bowel wall thickening. No small bowel dilatation. The terminal ileum is normal. The appendix is not visualized, but there is no edema or inflammation in the region of the cecum. No gross colonic mass. No colonic wall thickening.  Vascular/Lymphatic: There is abdominal aortic atherosclerosis without aneurysm. There is no gastrohepatic or hepatoduodenal ligament lymphadenopathy. No retroperitoneal or mesenteric lymphadenopathy. No pelvic sidewall lymphadenopathy.  Reproductive: Probable TURP defect.  Other: No intraperitoneal free fluid.  Musculoskeletal: No worrisome lytic or sclerotic osseous abnormality.  IMPRESSION: 1. Bilateral nonobstructing renal stones. 2. Bilateral simple renal cysts with some tiny lesions in both kidneys that are too small to characterize. 3. Aortic Atherosclerosis (ICD10-I70.0) and Emphysema (ICD10-J43.9).   Electronically Signed   By: Misty Stanley M.D.   On: 11/16/2019 10:12  I have personally reviewed the images and agree with radiologist interpretation.   Assessment/ Plan:  1. Nephrolithiasis  CT indicated bilateral nonobstructing renal stones greater on the right than left  Currently asymptomatic from the stones thus given his age and comorbidities, would recommend surveillance Small 2 mm stone removed at time of withdrawal of scope Recommended to return in 1 year for KUB/UA with [facility] PA-C  2. Hematuria S/p complete evaluation with CT urogram with findings as above as well as cystoscopy today Etiology of blood in urine is likely multifactorial including stones and friable prostatic mucosa  Return in about 1 year (around 11/30/2020) for with PA KUB and UA.   Jamas Lav, am acting as a scribe for Dr. Hollice Espy,  I have reviewed the above documentation for accuracy and completeness, and I agree with the above.   Hollice Espy, MD

## 2019-12-01 ENCOUNTER — Ambulatory Visit (INDEPENDENT_AMBULATORY_CARE_PROVIDER_SITE_OTHER): Payer: Medicare HMO | Admitting: Urology

## 2019-12-01 ENCOUNTER — Other Ambulatory Visit: Payer: Self-pay

## 2019-12-01 ENCOUNTER — Encounter: Payer: Self-pay | Admitting: Urology

## 2019-12-01 VITALS — BP 135/78 | HR 58

## 2019-12-01 DIAGNOSIS — N200 Calculus of kidney: Secondary | ICD-10-CM

## 2019-12-01 DIAGNOSIS — R3129 Other microscopic hematuria: Secondary | ICD-10-CM | POA: Diagnosis not present

## 2019-12-02 LAB — MICROSCOPIC EXAMINATION

## 2019-12-02 LAB — URINALYSIS, COMPLETE
Bilirubin, UA: NEGATIVE
Glucose, UA: NEGATIVE
Leukocytes,UA: NEGATIVE
Nitrite, UA: NEGATIVE
Protein,UA: NEGATIVE
Specific Gravity, UA: 1.025 (ref 1.005–1.030)
Urobilinogen, Ur: 0.2 mg/dL (ref 0.2–1.0)
pH, UA: 5 (ref 5.0–7.5)

## 2019-12-03 DIAGNOSIS — H35351 Cystoid macular degeneration, right eye: Secondary | ICD-10-CM | POA: Diagnosis not present

## 2019-12-14 DIAGNOSIS — Z96652 Presence of left artificial knee joint: Secondary | ICD-10-CM | POA: Diagnosis not present

## 2020-03-03 DIAGNOSIS — G4730 Sleep apnea, unspecified: Secondary | ICD-10-CM | POA: Diagnosis not present

## 2020-03-03 DIAGNOSIS — I10 Essential (primary) hypertension: Secondary | ICD-10-CM | POA: Diagnosis not present

## 2020-03-03 DIAGNOSIS — I480 Paroxysmal atrial fibrillation: Secondary | ICD-10-CM | POA: Diagnosis not present

## 2020-03-03 DIAGNOSIS — I2510 Atherosclerotic heart disease of native coronary artery without angina pectoris: Secondary | ICD-10-CM | POA: Diagnosis not present

## 2020-04-06 DIAGNOSIS — I480 Paroxysmal atrial fibrillation: Secondary | ICD-10-CM | POA: Diagnosis not present

## 2020-04-06 DIAGNOSIS — R3129 Other microscopic hematuria: Secondary | ICD-10-CM | POA: Diagnosis not present

## 2020-04-06 DIAGNOSIS — Z125 Encounter for screening for malignant neoplasm of prostate: Secondary | ICD-10-CM | POA: Diagnosis not present

## 2020-04-06 DIAGNOSIS — R5383 Other fatigue: Secondary | ICD-10-CM | POA: Diagnosis not present

## 2020-04-06 DIAGNOSIS — G4700 Insomnia, unspecified: Secondary | ICD-10-CM | POA: Diagnosis not present

## 2020-04-06 DIAGNOSIS — E782 Mixed hyperlipidemia: Secondary | ICD-10-CM | POA: Diagnosis not present

## 2020-04-06 DIAGNOSIS — I10 Essential (primary) hypertension: Secondary | ICD-10-CM | POA: Diagnosis not present

## 2020-04-06 DIAGNOSIS — D649 Anemia, unspecified: Secondary | ICD-10-CM | POA: Diagnosis not present

## 2020-04-06 DIAGNOSIS — Z0000 Encounter for general adult medical examination without abnormal findings: Secondary | ICD-10-CM | POA: Diagnosis not present

## 2020-04-10 DIAGNOSIS — G4733 Obstructive sleep apnea (adult) (pediatric): Secondary | ICD-10-CM | POA: Diagnosis not present

## 2020-04-14 DIAGNOSIS — I10 Essential (primary) hypertension: Secondary | ICD-10-CM | POA: Diagnosis not present

## 2020-04-14 DIAGNOSIS — G4730 Sleep apnea, unspecified: Secondary | ICD-10-CM | POA: Diagnosis not present

## 2020-04-14 DIAGNOSIS — Z79899 Other long term (current) drug therapy: Secondary | ICD-10-CM | POA: Diagnosis not present

## 2020-04-14 DIAGNOSIS — G4700 Insomnia, unspecified: Secondary | ICD-10-CM | POA: Diagnosis not present

## 2020-04-14 DIAGNOSIS — I480 Paroxysmal atrial fibrillation: Secondary | ICD-10-CM | POA: Diagnosis not present

## 2020-04-14 DIAGNOSIS — E785 Hyperlipidemia, unspecified: Secondary | ICD-10-CM | POA: Diagnosis not present

## 2020-04-18 DIAGNOSIS — I340 Nonrheumatic mitral (valve) insufficiency: Secondary | ICD-10-CM | POA: Diagnosis not present

## 2020-04-18 DIAGNOSIS — I2510 Atherosclerotic heart disease of native coronary artery without angina pectoris: Secondary | ICD-10-CM | POA: Diagnosis not present

## 2020-04-18 DIAGNOSIS — I480 Paroxysmal atrial fibrillation: Secondary | ICD-10-CM | POA: Diagnosis not present

## 2020-04-18 DIAGNOSIS — E782 Mixed hyperlipidemia: Secondary | ICD-10-CM | POA: Diagnosis not present

## 2020-04-18 DIAGNOSIS — I10 Essential (primary) hypertension: Secondary | ICD-10-CM | POA: Diagnosis not present

## 2020-04-18 DIAGNOSIS — G4733 Obstructive sleep apnea (adult) (pediatric): Secondary | ICD-10-CM | POA: Insufficient documentation

## 2020-06-20 DIAGNOSIS — G4733 Obstructive sleep apnea (adult) (pediatric): Secondary | ICD-10-CM | POA: Diagnosis not present

## 2020-07-21 DIAGNOSIS — G4733 Obstructive sleep apnea (adult) (pediatric): Secondary | ICD-10-CM | POA: Diagnosis not present

## 2020-08-11 DIAGNOSIS — Z03818 Encounter for observation for suspected exposure to other biological agents ruled out: Secondary | ICD-10-CM | POA: Diagnosis not present

## 2020-08-11 DIAGNOSIS — Z1152 Encounter for screening for COVID-19: Secondary | ICD-10-CM | POA: Diagnosis not present

## 2020-09-07 DIAGNOSIS — I10 Essential (primary) hypertension: Secondary | ICD-10-CM | POA: Diagnosis not present

## 2020-09-07 DIAGNOSIS — I480 Paroxysmal atrial fibrillation: Secondary | ICD-10-CM | POA: Diagnosis not present

## 2020-09-07 DIAGNOSIS — I2510 Atherosclerotic heart disease of native coronary artery without angina pectoris: Secondary | ICD-10-CM | POA: Diagnosis not present

## 2020-09-07 DIAGNOSIS — E782 Mixed hyperlipidemia: Secondary | ICD-10-CM | POA: Diagnosis not present

## 2020-09-07 DIAGNOSIS — I340 Nonrheumatic mitral (valve) insufficiency: Secondary | ICD-10-CM | POA: Diagnosis not present

## 2020-09-07 DIAGNOSIS — G4700 Insomnia, unspecified: Secondary | ICD-10-CM | POA: Diagnosis not present

## 2020-09-07 DIAGNOSIS — N1831 Chronic kidney disease, stage 3a: Secondary | ICD-10-CM | POA: Diagnosis not present

## 2020-09-07 DIAGNOSIS — R5383 Other fatigue: Secondary | ICD-10-CM | POA: Diagnosis not present

## 2020-09-07 DIAGNOSIS — G4730 Sleep apnea, unspecified: Secondary | ICD-10-CM | POA: Diagnosis not present

## 2020-09-14 DIAGNOSIS — E785 Hyperlipidemia, unspecified: Secondary | ICD-10-CM | POA: Diagnosis not present

## 2020-09-14 DIAGNOSIS — N1832 Chronic kidney disease, stage 3b: Secondary | ICD-10-CM | POA: Diagnosis present

## 2020-09-14 DIAGNOSIS — R635 Abnormal weight gain: Secondary | ICD-10-CM | POA: Diagnosis not present

## 2020-09-14 DIAGNOSIS — F5104 Psychophysiologic insomnia: Secondary | ICD-10-CM | POA: Diagnosis not present

## 2020-09-14 DIAGNOSIS — Z79899 Other long term (current) drug therapy: Secondary | ICD-10-CM | POA: Diagnosis not present

## 2020-09-14 DIAGNOSIS — I480 Paroxysmal atrial fibrillation: Secondary | ICD-10-CM | POA: Diagnosis not present

## 2020-09-14 DIAGNOSIS — G4733 Obstructive sleep apnea (adult) (pediatric): Secondary | ICD-10-CM | POA: Diagnosis not present

## 2020-09-14 DIAGNOSIS — I10 Essential (primary) hypertension: Secondary | ICD-10-CM | POA: Diagnosis not present

## 2020-09-27 DIAGNOSIS — M79675 Pain in left toe(s): Secondary | ICD-10-CM | POA: Diagnosis not present

## 2020-09-27 DIAGNOSIS — L819 Disorder of pigmentation, unspecified: Secondary | ICD-10-CM | POA: Diagnosis not present

## 2020-09-27 DIAGNOSIS — L600 Ingrowing nail: Secondary | ICD-10-CM | POA: Diagnosis not present

## 2020-09-27 DIAGNOSIS — M79674 Pain in right toe(s): Secondary | ICD-10-CM | POA: Diagnosis not present

## 2020-09-27 DIAGNOSIS — B351 Tinea unguium: Secondary | ICD-10-CM | POA: Diagnosis not present

## 2020-10-18 DIAGNOSIS — I340 Nonrheumatic mitral (valve) insufficiency: Secondary | ICD-10-CM | POA: Diagnosis not present

## 2020-10-18 DIAGNOSIS — I480 Paroxysmal atrial fibrillation: Secondary | ICD-10-CM | POA: Diagnosis not present

## 2020-10-18 DIAGNOSIS — I2510 Atherosclerotic heart disease of native coronary artery without angina pectoris: Secondary | ICD-10-CM | POA: Diagnosis not present

## 2020-10-18 DIAGNOSIS — G4733 Obstructive sleep apnea (adult) (pediatric): Secondary | ICD-10-CM | POA: Diagnosis not present

## 2020-10-18 DIAGNOSIS — E782 Mixed hyperlipidemia: Secondary | ICD-10-CM | POA: Diagnosis not present

## 2020-10-18 DIAGNOSIS — I10 Essential (primary) hypertension: Secondary | ICD-10-CM | POA: Diagnosis not present

## 2020-11-10 IMAGING — CT CT ABD-PEL WO/W CM
3 of 12 series · 11 of 46 positions shown, 17 images · IV contrast (omnipaque)
Comparison: None.

CLINICAL DATA: Microscopic hematuria.

EXAM:
CT ABDOMEN AND PELVIS WITHOUT AND WITH CONTRAST
TECHNIQUE: Multidetector CT imaging of the abdomen and pelvis was performed
following the standard protocol before and following the bolus
administration of intravenous contrast.
CONTRAST:  150mL OMNIPAQUE IOHEXOL 300 MG/ML  SOLN

[Series 2: abd without pre 5.00 · axial · non-contrast · 0.74mm/px · z∈[-1520,-1170]mm · 7 of 94 slices shown, 12 images]
[im 12/94  soft-tissue]
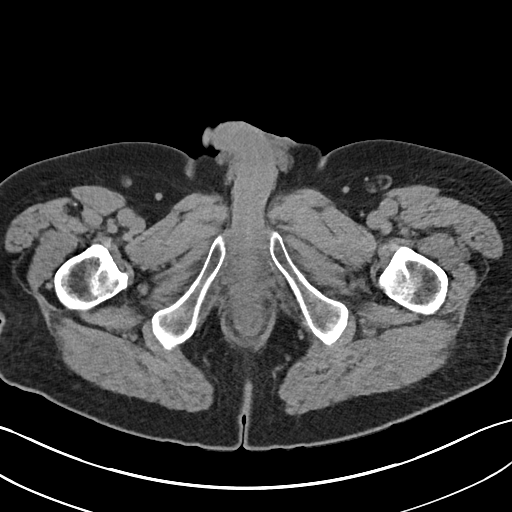
[im 12/94  bone]
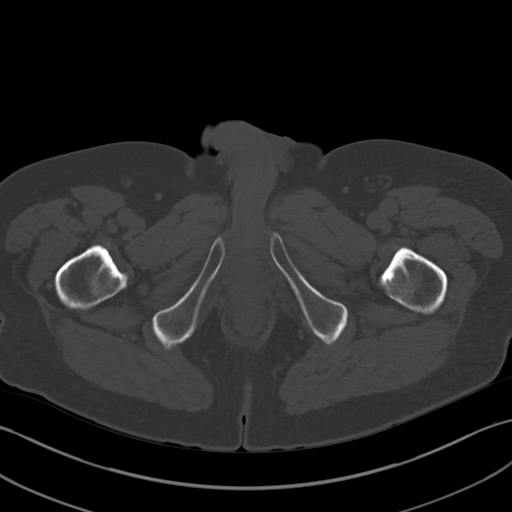
[im 24/94  soft-tissue]
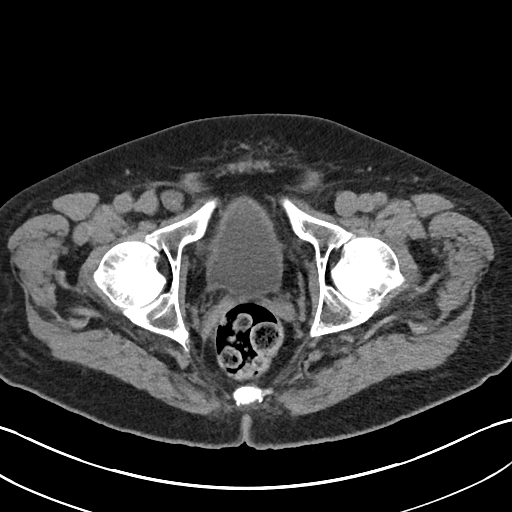
[im 35/94  soft-tissue]
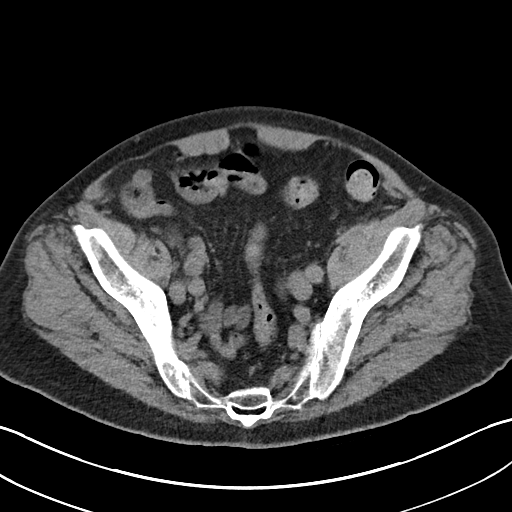
[im 47/94  soft-tissue]
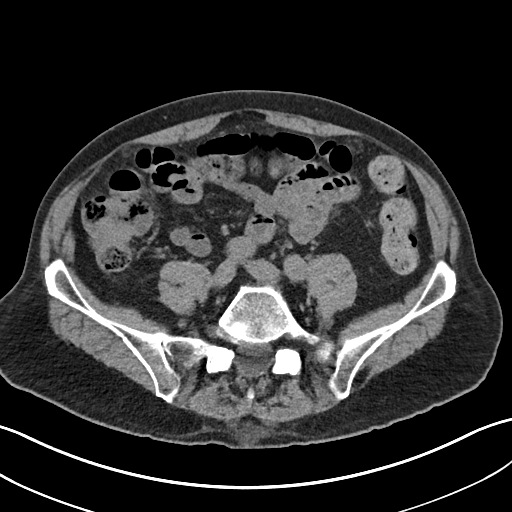
[im 47/94  lung]
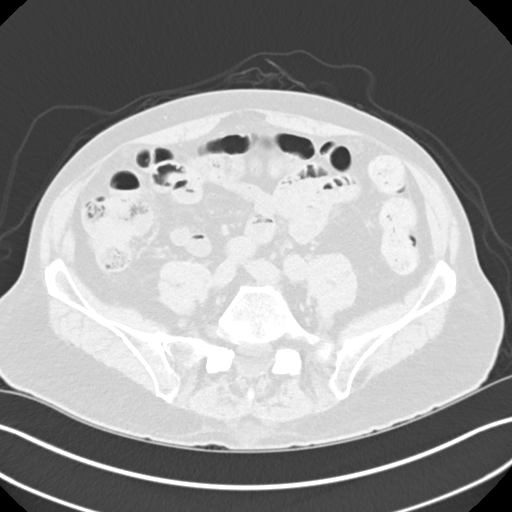
[im 59/94  soft-tissue]
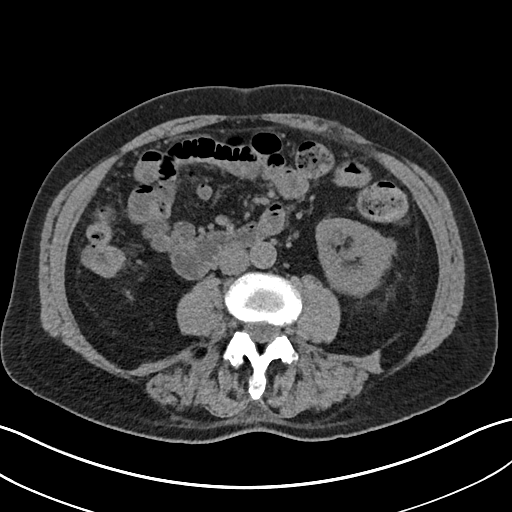
[im 59/94  lung]
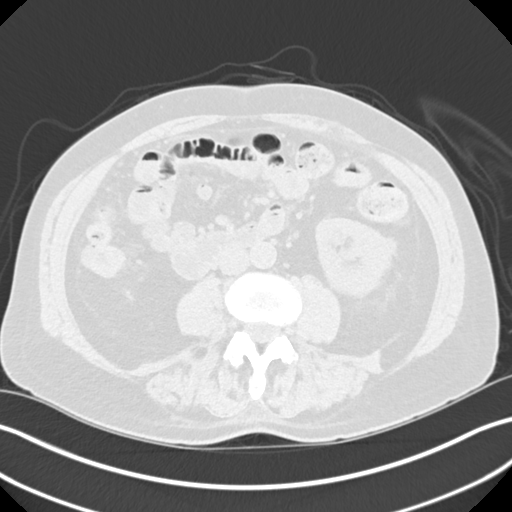
[im 70/94  soft-tissue]
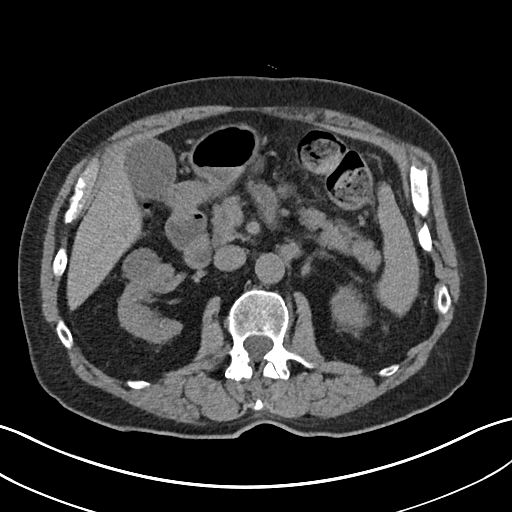
[im 70/94  lung]
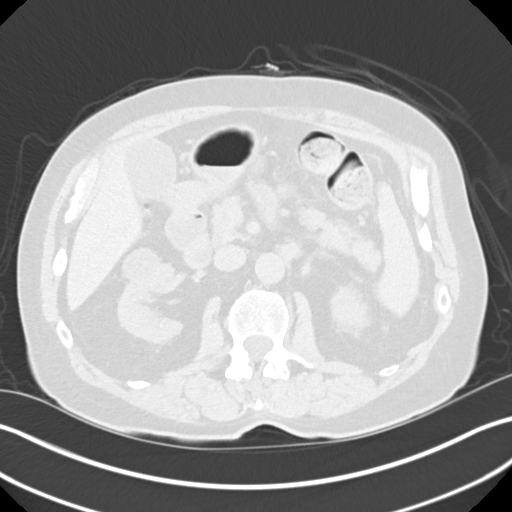
[im 82/94  soft-tissue]
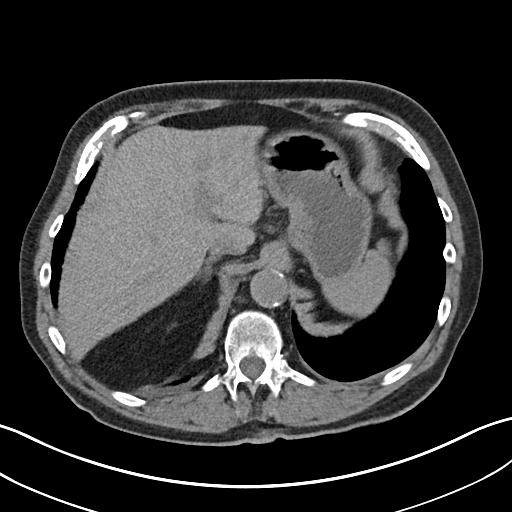
[im 82/94  lung]
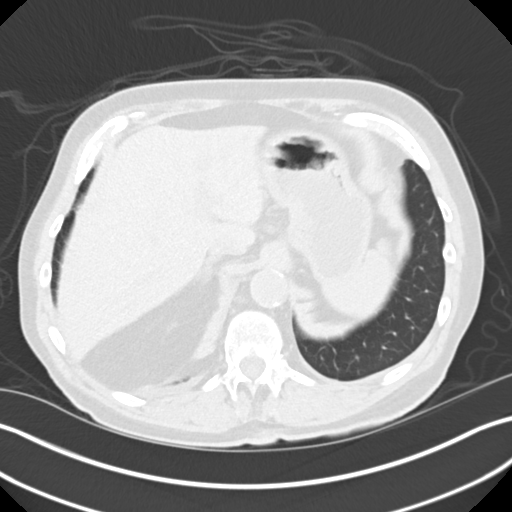

[Series 9: axial with hematuria with 5.00 · axial · 0.74mm/px · z∈[-1510,-1445]mm · 2 of 94 slices shown]
[im 14/94  soft-tissue]
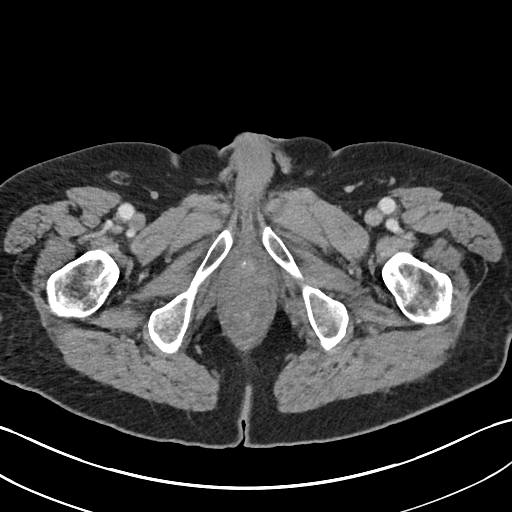
[im 27/94  soft-tissue]
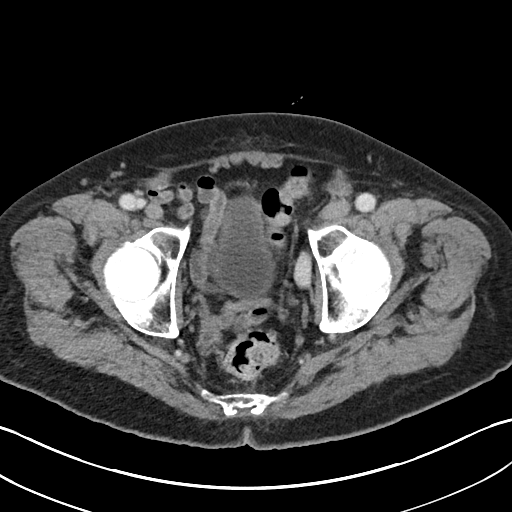

[Series 20: cor delay delay prone 2.00 cor · coronal · delayed · 0.74mm/px · 2 of 173 slices shown, 3 images]
[im 58/173  soft-tissue]
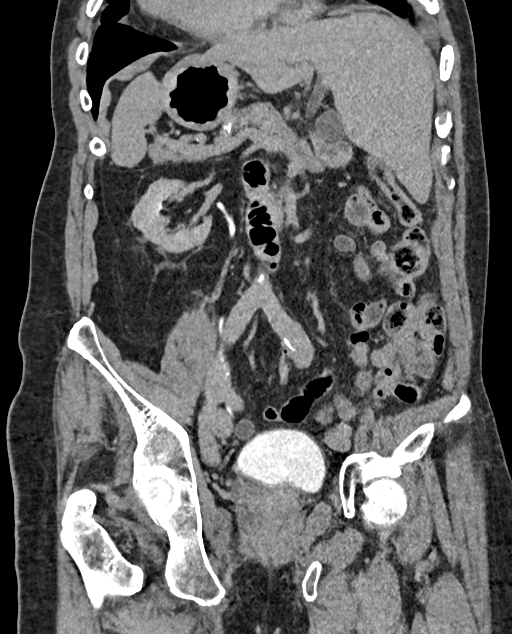
[im 58/173  bone]
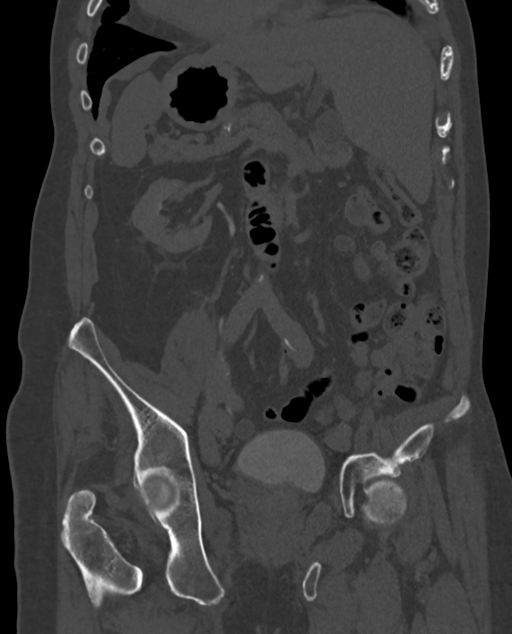
[im 115/173  soft-tissue]
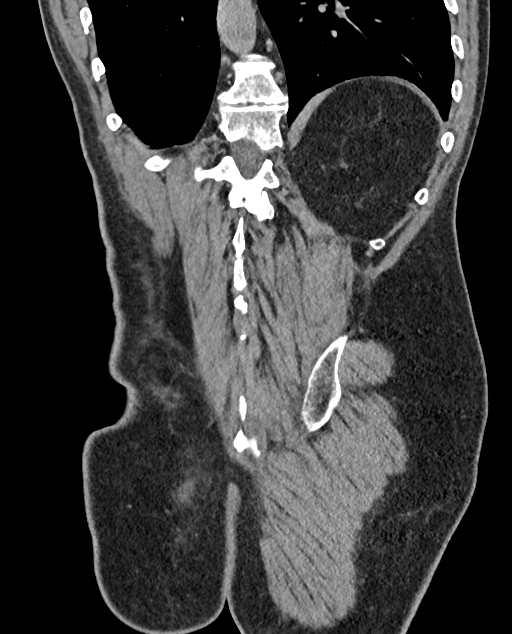

[11 of 46 positions shown; findings below may reference images not displayed]

FINDINGS: Lower chest: Emphysema.

Hepatobiliary: 6 mm hypodensity identified medial segment left
liver, too small to characterize. There is no evidence for
gallstones, gallbladder wall thickening, or pericholecystic fluid.
No intrahepatic or extrahepatic biliary dilation.

Pancreas: No focal mass lesion. No dilatation of the main duct. No
intraparenchymal cyst. No peripancreatic edema.

Spleen: No splenomegaly. No focal mass lesion.

Adrenals/Urinary Tract: No adrenal nodule or mass.

Precontrast imaging shows 2 stones in the right kidney, measuring 6
x 7 mm in the interpolar region and 1.2 x 1.2 x 1.2 cm in the lower
pole. No right ureteral stone. 3 punctate 1-2 mm stones are seen in
the left kidney, in the interpolar region and best appreciated on
coronal images 79, 83, and 84. No left ureteral stone. No bladder
stone 3 mm stone identified at the bladder base adjacent to the
urethral origin (image 76/2 appears to be extraluminal.

Imaging after IV contrast administration simple cysts in the right
kidney measure up to 2.8 cm. Simple cysts in the left kidney measure
up to 2.3 cm. There are some tiny lesions in both kidneys that are
too small to characterize. No overtly suspicious enhancing renal
mass.

Delayed imaging shows no wall thickening or soft tissue filling
defect in either intrarenal collecting system or renal pelvis. Both
ureters are relatively well opacified and show no wall thickening or
filling defect. No mass lesion along the length of either ureter. No
focal bladder wall abnormality evident.

Stomach/Bowel: Stomach is unremarkable. No gastric wall thickening.
No evidence of outlet obstruction. Duodenum is normally positioned
as is the ligament of Treitz. No small bowel wall thickening. No
small bowel dilatation. The terminal ileum is normal. The appendix
is not visualized, but there is no edema or inflammation in the
region of the cecum. No gross colonic mass. No colonic wall
thickening.

Vascular/Lymphatic: There is abdominal aortic atherosclerosis
without aneurysm. There is no gastrohepatic or hepatoduodenal
ligament lymphadenopathy. No retroperitoneal or mesenteric
lymphadenopathy. No pelvic sidewall lymphadenopathy.

Reproductive: Probable TURP defect.

Other: No intraperitoneal free fluid.

Musculoskeletal: No worrisome lytic or sclerotic osseous
abnormality.
IMPRESSION: 1. Bilateral nonobstructing renal stones.
2. Bilateral simple renal cysts with some tiny lesions in both
kidneys that are too small to characterize.
3. Aortic Atherosclerosis (B2KMR-DSC.C) and Emphysema (B2KMR-EVS.E).

## 2020-11-17 DIAGNOSIS — I2510 Atherosclerotic heart disease of native coronary artery without angina pectoris: Secondary | ICD-10-CM | POA: Diagnosis not present

## 2020-11-17 DIAGNOSIS — I10 Essential (primary) hypertension: Secondary | ICD-10-CM | POA: Diagnosis not present

## 2020-11-17 DIAGNOSIS — E782 Mixed hyperlipidemia: Secondary | ICD-10-CM | POA: Diagnosis not present

## 2020-11-17 DIAGNOSIS — I480 Paroxysmal atrial fibrillation: Secondary | ICD-10-CM | POA: Diagnosis not present

## 2020-11-30 ENCOUNTER — Ambulatory Visit: Payer: Self-pay | Admitting: Physician Assistant

## 2021-01-30 DIAGNOSIS — Z961 Presence of intraocular lens: Secondary | ICD-10-CM | POA: Diagnosis not present

## 2021-03-13 DIAGNOSIS — E782 Mixed hyperlipidemia: Secondary | ICD-10-CM | POA: Diagnosis not present

## 2021-03-13 DIAGNOSIS — R635 Abnormal weight gain: Secondary | ICD-10-CM | POA: Diagnosis not present

## 2021-03-13 DIAGNOSIS — I480 Paroxysmal atrial fibrillation: Secondary | ICD-10-CM | POA: Diagnosis not present

## 2021-03-13 DIAGNOSIS — I2510 Atherosclerotic heart disease of native coronary artery without angina pectoris: Secondary | ICD-10-CM | POA: Diagnosis not present

## 2021-03-13 DIAGNOSIS — R7309 Other abnormal glucose: Secondary | ICD-10-CM | POA: Diagnosis not present

## 2021-03-13 DIAGNOSIS — I10 Essential (primary) hypertension: Secondary | ICD-10-CM | POA: Diagnosis not present

## 2021-03-13 DIAGNOSIS — G4733 Obstructive sleep apnea (adult) (pediatric): Secondary | ICD-10-CM | POA: Diagnosis not present

## 2021-03-20 DIAGNOSIS — G4700 Insomnia, unspecified: Secondary | ICD-10-CM | POA: Diagnosis not present

## 2021-03-20 DIAGNOSIS — Z1389 Encounter for screening for other disorder: Secondary | ICD-10-CM | POA: Diagnosis not present

## 2021-03-20 DIAGNOSIS — R635 Abnormal weight gain: Secondary | ICD-10-CM | POA: Diagnosis not present

## 2021-03-20 DIAGNOSIS — N289 Disorder of kidney and ureter, unspecified: Secondary | ICD-10-CM | POA: Diagnosis not present

## 2021-03-20 DIAGNOSIS — I10 Essential (primary) hypertension: Secondary | ICD-10-CM | POA: Diagnosis not present

## 2021-03-20 DIAGNOSIS — Z6831 Body mass index (BMI) 31.0-31.9, adult: Secondary | ICD-10-CM | POA: Diagnosis not present

## 2021-03-20 DIAGNOSIS — I480 Paroxysmal atrial fibrillation: Secondary | ICD-10-CM | POA: Diagnosis not present

## 2021-03-20 DIAGNOSIS — Z0000 Encounter for general adult medical examination without abnormal findings: Secondary | ICD-10-CM | POA: Diagnosis not present

## 2021-03-20 DIAGNOSIS — G4733 Obstructive sleep apnea (adult) (pediatric): Secondary | ICD-10-CM | POA: Diagnosis not present

## 2021-04-10 DIAGNOSIS — Z96652 Presence of left artificial knee joint: Secondary | ICD-10-CM | POA: Diagnosis not present

## 2021-04-10 DIAGNOSIS — M1711 Unilateral primary osteoarthritis, right knee: Secondary | ICD-10-CM | POA: Diagnosis not present

## 2021-06-01 DIAGNOSIS — R0602 Shortness of breath: Secondary | ICD-10-CM | POA: Diagnosis not present

## 2021-06-01 DIAGNOSIS — E782 Mixed hyperlipidemia: Secondary | ICD-10-CM | POA: Diagnosis not present

## 2021-06-01 DIAGNOSIS — I480 Paroxysmal atrial fibrillation: Secondary | ICD-10-CM | POA: Diagnosis not present

## 2021-06-01 DIAGNOSIS — G4733 Obstructive sleep apnea (adult) (pediatric): Secondary | ICD-10-CM | POA: Diagnosis not present

## 2021-06-01 DIAGNOSIS — I10 Essential (primary) hypertension: Secondary | ICD-10-CM | POA: Diagnosis not present

## 2021-06-01 DIAGNOSIS — I340 Nonrheumatic mitral (valve) insufficiency: Secondary | ICD-10-CM | POA: Diagnosis not present

## 2021-06-01 DIAGNOSIS — N1831 Chronic kidney disease, stage 3a: Secondary | ICD-10-CM | POA: Diagnosis not present

## 2021-06-01 DIAGNOSIS — I2510 Atherosclerotic heart disease of native coronary artery without angina pectoris: Secondary | ICD-10-CM | POA: Diagnosis not present

## 2021-06-07 DIAGNOSIS — I10 Essential (primary) hypertension: Secondary | ICD-10-CM | POA: Diagnosis not present

## 2021-06-07 DIAGNOSIS — I480 Paroxysmal atrial fibrillation: Secondary | ICD-10-CM | POA: Diagnosis not present

## 2021-06-07 DIAGNOSIS — G4730 Sleep apnea, unspecified: Secondary | ICD-10-CM | POA: Diagnosis not present

## 2021-06-07 DIAGNOSIS — G4700 Insomnia, unspecified: Secondary | ICD-10-CM | POA: Diagnosis not present

## 2021-06-07 DIAGNOSIS — R5381 Other malaise: Secondary | ICD-10-CM | POA: Diagnosis not present

## 2021-06-07 DIAGNOSIS — R251 Tremor, unspecified: Secondary | ICD-10-CM | POA: Diagnosis not present

## 2021-06-07 DIAGNOSIS — R5383 Other fatigue: Secondary | ICD-10-CM | POA: Diagnosis not present

## 2021-06-07 DIAGNOSIS — N1831 Chronic kidney disease, stage 3a: Secondary | ICD-10-CM | POA: Diagnosis not present

## 2021-06-26 DIAGNOSIS — R5383 Other fatigue: Secondary | ICD-10-CM | POA: Diagnosis not present

## 2021-06-26 DIAGNOSIS — I10 Essential (primary) hypertension: Secondary | ICD-10-CM | POA: Diagnosis not present

## 2021-06-26 DIAGNOSIS — R0602 Shortness of breath: Secondary | ICD-10-CM | POA: Diagnosis not present

## 2021-06-26 DIAGNOSIS — G4733 Obstructive sleep apnea (adult) (pediatric): Secondary | ICD-10-CM | POA: Diagnosis not present

## 2021-06-26 DIAGNOSIS — I340 Nonrheumatic mitral (valve) insufficiency: Secondary | ICD-10-CM | POA: Diagnosis not present

## 2021-06-26 DIAGNOSIS — I480 Paroxysmal atrial fibrillation: Secondary | ICD-10-CM | POA: Diagnosis not present

## 2021-06-26 DIAGNOSIS — N1831 Chronic kidney disease, stage 3a: Secondary | ICD-10-CM | POA: Diagnosis not present

## 2021-06-26 DIAGNOSIS — I2510 Atherosclerotic heart disease of native coronary artery without angina pectoris: Secondary | ICD-10-CM | POA: Diagnosis not present

## 2021-06-26 DIAGNOSIS — E782 Mixed hyperlipidemia: Secondary | ICD-10-CM | POA: Diagnosis not present

## 2021-07-10 DIAGNOSIS — M1711 Unilateral primary osteoarthritis, right knee: Secondary | ICD-10-CM | POA: Diagnosis not present

## 2021-07-10 DIAGNOSIS — Z96652 Presence of left artificial knee joint: Secondary | ICD-10-CM | POA: Diagnosis not present

## 2021-07-13 DIAGNOSIS — I480 Paroxysmal atrial fibrillation: Secondary | ICD-10-CM | POA: Diagnosis not present

## 2021-07-13 DIAGNOSIS — R0602 Shortness of breath: Secondary | ICD-10-CM | POA: Diagnosis not present

## 2021-07-24 DIAGNOSIS — E782 Mixed hyperlipidemia: Secondary | ICD-10-CM | POA: Diagnosis not present

## 2021-07-24 DIAGNOSIS — I340 Nonrheumatic mitral (valve) insufficiency: Secondary | ICD-10-CM | POA: Diagnosis not present

## 2021-07-24 DIAGNOSIS — N1831 Chronic kidney disease, stage 3a: Secondary | ICD-10-CM | POA: Diagnosis not present

## 2021-07-24 DIAGNOSIS — I480 Paroxysmal atrial fibrillation: Secondary | ICD-10-CM | POA: Diagnosis not present

## 2021-07-24 DIAGNOSIS — I2510 Atherosclerotic heart disease of native coronary artery without angina pectoris: Secondary | ICD-10-CM | POA: Diagnosis not present

## 2021-07-24 DIAGNOSIS — G4733 Obstructive sleep apnea (adult) (pediatric): Secondary | ICD-10-CM | POA: Diagnosis not present

## 2021-07-24 DIAGNOSIS — I10 Essential (primary) hypertension: Secondary | ICD-10-CM | POA: Diagnosis not present

## 2021-07-28 DIAGNOSIS — R251 Tremor, unspecified: Secondary | ICD-10-CM | POA: Diagnosis not present

## 2021-07-28 DIAGNOSIS — E782 Mixed hyperlipidemia: Secondary | ICD-10-CM | POA: Diagnosis not present

## 2021-07-28 DIAGNOSIS — F32A Depression, unspecified: Secondary | ICD-10-CM | POA: Diagnosis not present

## 2021-07-28 DIAGNOSIS — R5383 Other fatigue: Secondary | ICD-10-CM | POA: Diagnosis not present

## 2021-07-28 DIAGNOSIS — I480 Paroxysmal atrial fibrillation: Secondary | ICD-10-CM | POA: Diagnosis not present

## 2021-07-28 DIAGNOSIS — N189 Chronic kidney disease, unspecified: Secondary | ICD-10-CM | POA: Diagnosis not present

## 2021-07-28 DIAGNOSIS — R5381 Other malaise: Secondary | ICD-10-CM | POA: Diagnosis not present

## 2021-07-28 DIAGNOSIS — F5104 Psychophysiologic insomnia: Secondary | ICD-10-CM | POA: Diagnosis not present

## 2021-11-09 DIAGNOSIS — R7309 Other abnormal glucose: Secondary | ICD-10-CM | POA: Diagnosis not present

## 2021-11-09 DIAGNOSIS — I480 Paroxysmal atrial fibrillation: Secondary | ICD-10-CM | POA: Diagnosis not present

## 2021-11-09 DIAGNOSIS — G4730 Sleep apnea, unspecified: Secondary | ICD-10-CM | POA: Diagnosis not present

## 2021-11-09 DIAGNOSIS — R600 Localized edema: Secondary | ICD-10-CM | POA: Diagnosis not present

## 2021-11-09 DIAGNOSIS — N1832 Chronic kidney disease, stage 3b: Secondary | ICD-10-CM | POA: Diagnosis not present

## 2021-11-09 DIAGNOSIS — R635 Abnormal weight gain: Secondary | ICD-10-CM | POA: Diagnosis not present

## 2021-11-09 DIAGNOSIS — I10 Essential (primary) hypertension: Secondary | ICD-10-CM | POA: Diagnosis not present

## 2021-11-09 DIAGNOSIS — G4700 Insomnia, unspecified: Secondary | ICD-10-CM | POA: Diagnosis not present

## 2021-11-09 DIAGNOSIS — G4733 Obstructive sleep apnea (adult) (pediatric): Secondary | ICD-10-CM | POA: Diagnosis not present

## 2021-11-16 DIAGNOSIS — I480 Paroxysmal atrial fibrillation: Secondary | ICD-10-CM | POA: Diagnosis not present

## 2021-11-16 DIAGNOSIS — I10 Essential (primary) hypertension: Secondary | ICD-10-CM | POA: Diagnosis not present

## 2021-11-16 DIAGNOSIS — R2689 Other abnormalities of gait and mobility: Secondary | ICD-10-CM | POA: Diagnosis not present

## 2021-11-16 DIAGNOSIS — R251 Tremor, unspecified: Secondary | ICD-10-CM | POA: Diagnosis not present

## 2021-11-16 DIAGNOSIS — N289 Disorder of kidney and ureter, unspecified: Secondary | ICD-10-CM | POA: Diagnosis not present

## 2021-11-16 DIAGNOSIS — F5104 Psychophysiologic insomnia: Secondary | ICD-10-CM | POA: Diagnosis not present

## 2021-11-16 DIAGNOSIS — R413 Other amnesia: Secondary | ICD-10-CM | POA: Diagnosis not present

## 2021-12-21 DIAGNOSIS — I10 Essential (primary) hypertension: Secondary | ICD-10-CM | POA: Diagnosis not present

## 2021-12-21 DIAGNOSIS — E782 Mixed hyperlipidemia: Secondary | ICD-10-CM | POA: Diagnosis not present

## 2021-12-21 DIAGNOSIS — I2510 Atherosclerotic heart disease of native coronary artery without angina pectoris: Secondary | ICD-10-CM | POA: Diagnosis not present

## 2021-12-21 DIAGNOSIS — I480 Paroxysmal atrial fibrillation: Secondary | ICD-10-CM | POA: Diagnosis not present

## 2022-01-09 DIAGNOSIS — G20 Parkinson's disease: Secondary | ICD-10-CM | POA: Diagnosis not present

## 2022-01-09 DIAGNOSIS — I480 Paroxysmal atrial fibrillation: Secondary | ICD-10-CM | POA: Diagnosis not present

## 2022-01-09 DIAGNOSIS — D631 Anemia in chronic kidney disease: Secondary | ICD-10-CM | POA: Diagnosis not present

## 2022-01-09 DIAGNOSIS — F330 Major depressive disorder, recurrent, mild: Secondary | ICD-10-CM | POA: Diagnosis not present

## 2022-01-09 DIAGNOSIS — I4892 Unspecified atrial flutter: Secondary | ICD-10-CM | POA: Diagnosis not present

## 2022-01-09 DIAGNOSIS — I129 Hypertensive chronic kidney disease with stage 1 through stage 4 chronic kidney disease, or unspecified chronic kidney disease: Secondary | ICD-10-CM | POA: Diagnosis not present

## 2022-01-09 DIAGNOSIS — N1832 Chronic kidney disease, stage 3b: Secondary | ICD-10-CM | POA: Diagnosis not present

## 2022-01-15 DIAGNOSIS — G250 Essential tremor: Secondary | ICD-10-CM | POA: Diagnosis not present

## 2022-01-15 DIAGNOSIS — M1990 Unspecified osteoarthritis, unspecified site: Secondary | ICD-10-CM | POA: Diagnosis not present

## 2022-01-15 DIAGNOSIS — Z7689 Persons encountering health services in other specified circumstances: Secondary | ICD-10-CM | POA: Diagnosis not present

## 2022-01-30 DIAGNOSIS — Z961 Presence of intraocular lens: Secondary | ICD-10-CM | POA: Diagnosis not present

## 2022-01-30 DIAGNOSIS — Z0100 Encounter for examination of eyes and vision without abnormal findings: Secondary | ICD-10-CM | POA: Diagnosis not present

## 2022-05-10 DIAGNOSIS — R2689 Other abnormalities of gait and mobility: Secondary | ICD-10-CM | POA: Diagnosis not present

## 2022-05-10 DIAGNOSIS — R7309 Other abnormal glucose: Secondary | ICD-10-CM | POA: Diagnosis not present

## 2022-05-10 DIAGNOSIS — R5383 Other fatigue: Secondary | ICD-10-CM | POA: Diagnosis not present

## 2022-05-10 DIAGNOSIS — Z79899 Other long term (current) drug therapy: Secondary | ICD-10-CM | POA: Diagnosis not present

## 2022-05-10 DIAGNOSIS — D649 Anemia, unspecified: Secondary | ICD-10-CM | POA: Diagnosis not present

## 2022-05-10 DIAGNOSIS — R5381 Other malaise: Secondary | ICD-10-CM | POA: Diagnosis not present

## 2022-05-10 DIAGNOSIS — F5104 Psychophysiologic insomnia: Secondary | ICD-10-CM | POA: Diagnosis not present

## 2022-05-10 DIAGNOSIS — I480 Paroxysmal atrial fibrillation: Secondary | ICD-10-CM | POA: Diagnosis not present

## 2022-05-10 DIAGNOSIS — R413 Other amnesia: Secondary | ICD-10-CM | POA: Diagnosis not present

## 2022-05-10 DIAGNOSIS — I10 Essential (primary) hypertension: Secondary | ICD-10-CM | POA: Diagnosis not present

## 2022-05-10 DIAGNOSIS — Z125 Encounter for screening for malignant neoplasm of prostate: Secondary | ICD-10-CM | POA: Diagnosis not present

## 2022-05-10 DIAGNOSIS — R251 Tremor, unspecified: Secondary | ICD-10-CM | POA: Diagnosis not present

## 2022-05-17 DIAGNOSIS — Z1331 Encounter for screening for depression: Secondary | ICD-10-CM | POA: Diagnosis not present

## 2022-05-17 DIAGNOSIS — E663 Overweight: Secondary | ICD-10-CM | POA: Diagnosis not present

## 2022-05-17 DIAGNOSIS — I10 Essential (primary) hypertension: Secondary | ICD-10-CM | POA: Diagnosis not present

## 2022-05-17 DIAGNOSIS — Z0000 Encounter for general adult medical examination without abnormal findings: Secondary | ICD-10-CM | POA: Diagnosis not present

## 2022-05-17 DIAGNOSIS — R251 Tremor, unspecified: Secondary | ICD-10-CM | POA: Diagnosis not present

## 2022-05-17 DIAGNOSIS — Z6831 Body mass index (BMI) 31.0-31.9, adult: Secondary | ICD-10-CM | POA: Diagnosis not present

## 2022-05-17 DIAGNOSIS — N289 Disorder of kidney and ureter, unspecified: Secondary | ICD-10-CM | POA: Diagnosis not present

## 2022-05-17 DIAGNOSIS — E785 Hyperlipidemia, unspecified: Secondary | ICD-10-CM | POA: Diagnosis not present

## 2022-05-17 DIAGNOSIS — F5104 Psychophysiologic insomnia: Secondary | ICD-10-CM | POA: Diagnosis not present

## 2022-07-09 DIAGNOSIS — M720 Palmar fascial fibromatosis [Dupuytren]: Secondary | ICD-10-CM | POA: Diagnosis not present

## 2022-09-01 DIAGNOSIS — W19XXXA Unspecified fall, initial encounter: Secondary | ICD-10-CM | POA: Diagnosis not present

## 2022-09-01 DIAGNOSIS — R531 Weakness: Secondary | ICD-10-CM | POA: Diagnosis not present

## 2022-09-02 ENCOUNTER — Emergency Department
Admission: EM | Admit: 2022-09-02 | Discharge: 2022-09-02 | Disposition: A | Discharge status: Home / Self Care | Payer: Medicare HMO | Attending: Emergency Medicine | Admitting: Emergency Medicine

## 2022-09-02 ENCOUNTER — Emergency Department: Payer: Medicare HMO

## 2022-09-02 ENCOUNTER — Encounter: Payer: Self-pay | Admitting: Emergency Medicine

## 2022-09-02 DIAGNOSIS — I4891 Unspecified atrial fibrillation: Secondary | ICD-10-CM | POA: Diagnosis not present

## 2022-09-02 DIAGNOSIS — N200 Calculus of kidney: Secondary | ICD-10-CM | POA: Diagnosis not present

## 2022-09-02 DIAGNOSIS — Z96652 Presence of left artificial knee joint: Secondary | ICD-10-CM | POA: Insufficient documentation

## 2022-09-02 DIAGNOSIS — N390 Urinary tract infection, site not specified: Secondary | ICD-10-CM | POA: Diagnosis not present

## 2022-09-02 DIAGNOSIS — N281 Cyst of kidney, acquired: Secondary | ICD-10-CM | POA: Diagnosis not present

## 2022-09-02 DIAGNOSIS — B9689 Other specified bacterial agents as the cause of diseases classified elsewhere: Secondary | ICD-10-CM | POA: Insufficient documentation

## 2022-09-02 DIAGNOSIS — R319 Hematuria, unspecified: Secondary | ICD-10-CM | POA: Diagnosis present

## 2022-09-02 DIAGNOSIS — R519 Headache, unspecified: Secondary | ICD-10-CM | POA: Diagnosis not present

## 2022-09-02 DIAGNOSIS — K4090 Unilateral inguinal hernia, without obstruction or gangrene, not specified as recurrent: Secondary | ICD-10-CM | POA: Diagnosis not present

## 2022-09-02 DIAGNOSIS — R531 Weakness: Secondary | ICD-10-CM | POA: Insufficient documentation

## 2022-09-02 DIAGNOSIS — Z7901 Long term (current) use of anticoagulants: Secondary | ICD-10-CM | POA: Diagnosis not present

## 2022-09-02 DIAGNOSIS — R350 Frequency of micturition: Secondary | ICD-10-CM | POA: Diagnosis not present

## 2022-09-02 DIAGNOSIS — M542 Cervicalgia: Secondary | ICD-10-CM | POA: Diagnosis not present

## 2022-09-02 DIAGNOSIS — J811 Chronic pulmonary edema: Secondary | ICD-10-CM | POA: Diagnosis not present

## 2022-09-02 LAB — COMPREHENSIVE METABOLIC PANEL
ALT: 30 U/L (ref 0–44)
AST: 46 U/L — ABNORMAL HIGH (ref 15–41)
Albumin: 3.6 g/dL (ref 3.5–5.0)
Alkaline Phosphatase: 56 U/L (ref 38–126)
Anion gap: 8 (ref 5–15)
BUN: 40 mg/dL — ABNORMAL HIGH (ref 8–23)
CO2: 26 mmol/L (ref 22–32)
Calcium: 9.2 mg/dL (ref 8.9–10.3)
Chloride: 105 mmol/L (ref 98–111)
Creatinine, Ser: 1.67 mg/dL — ABNORMAL HIGH (ref 0.61–1.24)
GFR, Estimated: 38 mL/min — ABNORMAL LOW (ref >60)
Glucose, Bld: 124 mg/dL — ABNORMAL HIGH (ref 70–99)
Potassium: 4.5 mmol/L (ref 3.5–5.1)
Sodium: 139 mmol/L (ref 135–145)
Total Bilirubin: 1.1 mg/dL (ref 0.3–1.2)
Total Protein: 6.8 g/dL (ref 6.5–8.1)

## 2022-09-02 LAB — CBC WITH DIFFERENTIAL/PLATELET
Abs Immature Granulocytes: 0.08 10*3/uL — ABNORMAL HIGH (ref 0.00–0.07)
Basophils Absolute: 0 10*3/uL (ref 0.0–0.1)
Basophils Relative: 0 %
Eosinophils Absolute: 0 10*3/uL (ref 0.0–0.5)
Eosinophils Relative: 0 %
HCT: 41.4 % (ref 39.0–52.0)
Hemoglobin: 13.5 g/dL (ref 13.0–17.0)
Immature Granulocytes: 1 %
Lymphocytes Relative: 4 %
Lymphs Abs: 0.6 10*3/uL — ABNORMAL LOW (ref 0.7–4.0)
MCH: 30.2 pg (ref 26.0–34.0)
MCHC: 32.6 g/dL (ref 30.0–36.0)
MCV: 92.6 fL (ref 80.0–100.0)
Monocytes Absolute: 1.1 10*3/uL — ABNORMAL HIGH (ref 0.1–1.0)
Monocytes Relative: 8 %
Neutro Abs: 12.4 10*3/uL — ABNORMAL HIGH (ref 1.7–7.7)
Neutrophils Relative %: 87 %
Platelets: 148 10*3/uL — ABNORMAL LOW (ref 150–400)
RBC: 4.47 MIL/uL (ref 4.22–5.81)
RDW: 13.3 % (ref 11.5–15.5)
WBC: 14.3 10*3/uL — ABNORMAL HIGH (ref 4.0–10.5)
nRBC: 0 % (ref 0.0–0.2)

## 2022-09-02 LAB — URINALYSIS, ROUTINE W REFLEX MICROSCOPIC
Bacteria, UA: NONE SEEN
Bilirubin Urine: NEGATIVE
Glucose, UA: NEGATIVE mg/dL
Ketones, ur: NEGATIVE mg/dL
Nitrite: NEGATIVE
Protein, ur: 100 mg/dL — AB
RBC / HPF: >50 RBC/hpf — ABNORMAL HIGH (ref 0–5)
Specific Gravity, Urine: 1.026 (ref 1.005–1.030)
WBC, UA: >50 WBC/hpf — ABNORMAL HIGH (ref 0–5)
pH: 5 (ref 5.0–8.0)

## 2022-09-02 LAB — TROPONIN I (HIGH SENSITIVITY)
Troponin I (High Sensitivity): 20 ng/L — ABNORMAL HIGH (ref <18)
Troponin I (High Sensitivity): 19 ng/L — ABNORMAL HIGH (ref <18)

## 2022-09-02 MED ORDER — LIDOCAINE HCL (PF) 1 % IJ SOLN
INTRAMUSCULAR | Status: AC
  Administered 2022-09-02: 2.1 mL
  Filled 2022-09-02: qty 5

## 2022-09-02 MED ORDER — CEPHALEXIN 500 MG PO CAPS: 500.0000 mg | ORAL_CAPSULE | Freq: Two times a day (BID) | ORAL | 0 refills | Status: DC

## 2022-09-02 MED ORDER — CEFTRIAXONE SODIUM 1 G IJ SOLR
1.0000 g | Freq: Once | INTRAMUSCULAR | Status: AC
  Administered 2022-09-02: 1 g via INTRAMUSCULAR
  Filled 2022-09-02 (×2): qty 10

## 2022-09-02 NOTE — ED Notes (Signed)
Pt was able to ambulate self around room without issue by himself.

## 2022-09-02 NOTE — ED Provider Notes (Addendum)
[facility] Provider Note    Event Date/Time   First MD Initiated Contact with Patient 09/02/22 [phone]     (approximate)   History   Weakness and Urinary Frequency   HPI  Ryan Pace is a 89 y.o. male with history of A-fib on Eliquis, hyperlipidemia who presents emergency department with complaints of generalized weakness.  He states that he went up to go the bathroom tonight and just could not get his legs to work right.  He states he had to sit down on the ground quickly and may have bumped his head but did not fall.  No chest or abdominal pain.  No neck or back pain.  No numbness, tingling or focal weakness.  No vomiting, diarrhea, shortness of breath, fever.  He states he was able to ambulate here and go to the bathroom and has noticed some blood in his urine but no dysuria.  Patient normally uses a cane at home but does have a walker if needed.   History provided by patient and wife.    Past Medical History:  Diagnosis Date   Anemia    Dysrhythmia    atrial fibrillation   Headache    Hyperlipidemia    Kidney stones    Prostatitis     Past Surgical History:  Procedure Laterality Date   APPENDECTOMY     CARDIAC CATHETERIZATION     CATARACT EXTRACTION, BILATERAL     COLONOSCOPY     1998, 2003, 2011   DUPUYTREN CONTRACTURE RELEASE Bilateral    DUPUYTREN CONTRACTURE RELEASE Right 01/11/2016   Procedure: DUPUYTREN CONTRACTURE RELEASE;  Surgeon: Earnestine Leys, MD;  Location: [facility] ORS;  Service: Orthopedics;  Laterality: Right;   FLEXIBLE SIGMOIDOSCOPY     1993   GREEN LIGHT LASER TURP (TRANSURETHRAL RESECTION OF PROSTATE N/A 02/15/2015   Procedure: GREEN LIGHT LASER TURP (TRANSURETHRAL RESECTION OF PROSTATE;  Surgeon: Royston Cowper, MD;  Location: [facility] ORS;  Service: Urology;  Laterality: N/A;   TONSILLECTOMY     TOTAL KNEE ARTHROPLASTY Left 10/29/2018   Procedure: TOTAL KNEE ARTHROPLASTY-LEFT;  Surgeon: Lovell Sheehan, MD;  Location: [facility]  ORS;  Service: Orthopedics;  Laterality: Left;    MEDICATIONS:  Prior to Admission medications   Medication Sig Start Date End Date Taking? Authorizing Provider  apixaban (ELIQUIS) 5 MG TABS tablet Take by mouth. 06/22/19   [provider]  cetirizine (ZYRTEC) 5 MG tablet Take 10 mg by mouth daily.     [provider]  cholecalciferol (VITAMIN D) 1000 units tablet Take 1,000 Units by mouth daily.     [provider]  ferrous sulfate 325 (65 FE) MG EC tablet Take 325 mg by mouth daily.    [provider]  lisinopril (PRINIVIL,ZESTRIL) 5 MG tablet Take 5 mg by mouth daily.     [provider]  metoprolol tartrate (LOPRESSOR) 25 MG tablet TAKE 1 TABLET BY MOUTH TWICE A DAY 09/07/19   [provider]  traZODone (DESYREL) 50 MG tablet Take 100 mg by mouth at bedtime.    [provider]  verapamil (CALAN) 40 MG tablet Take 40 mg by mouth 2 (two) times daily.     [provider]  zolpidem (AMBIEN) 5 MG tablet Take 5-10 mg by mouth at bedtime.    [provider]    Physical Exam   Triage Vital Signs: ED Triage Vitals  Enc Vitals Group     BP 09/02/22 0033 (!) 102/58  Pulse Rate 09/02/22 0033 65     Resp 09/02/22 0033 16     Temp 09/02/22 0033 97.9 F (36.6 C)     Temp Source 09/02/22 0033 Oral     SpO2 09/02/22 0033 92 %     Weight 09/02/22 0033 205 lb (93 kg)     Height 09/02/22 0033 '5\' 10"'$  (1.778 m)     Head Circumference --      Peak Flow --      Pain Score 09/02/22 0051 0     Pain Loc --      Pain Edu? --      Excl. in [place]? --     Most recent vital signs: Vitals:   09/02/22 0318 09/02/22 0600  BP: 123/83 129/65  Pulse: 91 76  Resp: 17 16  Temp: 97.8 F (36.6 C)   SpO2: 95% 97%    CONSTITUTIONAL: Alert and oriented and responds appropriately to questions. Well-appearing; well-nourished HEAD: Normocephalic, atraumatic EYES: Conjunctivae clear, pupils appear equal, sclera nonicteric ENT:  normal nose; moist mucous membranes NECK: Supple, normal ROM CARD: RRR; S1 and S2 appreciated; no murmurs, no clicks, no rubs, no gallops RESP: Normal chest excursion without splinting or tachypnea; breath sounds clear and equal bilaterally; no wheezes, no rhonchi, no rales, no hypoxia or respiratory distress, speaking full sentences ABD/GI: Normal bowel sounds; non-distended; soft, non-tender, no rebound, no guarding, no peritoneal signs BACK: The back appears normal EXT: Normal ROM in all joints; no deformity noted, no edema; no cyanosis SKIN: Normal color for age and race; warm; no rash on exposed skin NEURO: Moves all extremities equally, normal speech, no facial asymmetry PSYCH: The patient's mood and manner are appropriate.   ED Results / Procedures / Treatments   LABS: (all labs ordered are listed, but only abnormal results are displayed) Labs Reviewed  CBC WITH DIFFERENTIAL/PLATELET - Abnormal; Notable for the following components:      Result Value   WBC 14.3 (*)    Platelets 148 (*)    Neutro Abs 12.4 (*)    Lymphs Abs 0.6 (*)    Monocytes Absolute 1.1 (*)    Abs Immature Granulocytes 0.08 (*)    All other components within normal limits  COMPREHENSIVE METABOLIC PANEL - Abnormal; Notable for the following components:   Glucose, Bld 124 (*)    BUN 40 (*)    Creatinine, Ser 1.67 (*)    AST 46 (*)    GFR, Estimated 38 (*)    All other components within normal limits  URINALYSIS, ROUTINE W REFLEX MICROSCOPIC - Abnormal; Notable for the following components:   Color, Urine YELLOW (*)    APPearance TURBID (*)    Hgb urine dipstick LARGE (*)    Protein, ur 100 (*)    Leukocytes,Ua MODERATE (*)    RBC / HPF >50 (*)    WBC, UA >50 (*)    All other components within normal limits  TROPONIN I (HIGH SENSITIVITY) - Abnormal; Notable for the following components:   Troponin I (High Sensitivity) 20 (*)    All other components within normal limits  TROPONIN I (HIGH SENSITIVITY)  - Abnormal; Notable for the following components:   Troponin I (High Sensitivity) 19 (*)    All other components within normal limits  URINE CULTURE     EKG:  EKG Interpretation  Date/Time:  Sunday September 02 2022 00:37:49 EST Ventricular Rate:  61 PR Interval:  174 QRS Duration: 96 QT Interval:  416 QTC  Calculation: 418 R Axis:   -33 Text Interpretation: Sinus rhythm with Premature atrial complexes Left axis deviation Incomplete right bundle branch block Anterior infarct (cited on or before 08-Jun-2018) Abnormal ECG When compared with ECG of 08-Jun-2018 14:55, Premature atrial complexes are now Present Vent. rate has decreased BY  48 BPM Incomplete right bundle branch block is now Present and was present in 2009 Confirmed by Charleton Deyoung, Cyril Mourning [phone]) on 09/02/2022 5:46:53 AM         RADIOLOGY: My personal review and interpretation of imaging: CT scans show no acute abnormality.  I have personally reviewed all radiology reports.   CT Renal Stone Study  Result Date: 09/02/2022 CLINICAL DATA:  Abdominal and flank pain with stone suspected. Weakness and urinary frequency for 2-3 days EXAM: CT ABDOMEN AND PELVIS WITHOUT CONTRAST TECHNIQUE: Multidetector CT imaging of the abdomen and pelvis was performed following the standard protocol without IV contrast. RADIATION DOSE REDUCTION: This exam was performed according to the departmental dose-optimization program which includes automated exposure control, adjustment of the mA and/or kV according to patient size and/or use of iterative reconstruction technique. COMPARISON:  11/16/2019 FINDINGS: Lower chest:  No acute finding Hepatobiliary: No focal liver abnormality.No evidence of biliary obstruction or stone. Pancreas: Unremarkable. Spleen: Unremarkable. Adrenals/Urinary Tract: Negative adrenals. No hydronephrosis or ureteral stone. Two right renal calculi measuring up to 11 mm at the lower pole. At least 4 small left renal calculi. Scattered  bilateral renal cortical cysts. Intermediate density lesion at the lower pole left kidney measuring 2.4 cm. No interval change or specific follow-up recommended. Unremarkable bladder. Stomach/Bowel:  No obstruction. No visible bowel inflammation. Vascular/Lymphatic: No acute vascular abnormality. No mass or adenopathy. Reproductive:Stable from prior - no convincing inflammation. Other: Right groin hernia containing nonobstructed small bowel loop. Musculoskeletal: Ordinary degenerative changes.  No acute finding. IMPRESSION: 1. No acute finding. No hydronephrosis or visible urinary inflammation. 2. Multiple renal calculi and cysts. 3. Right groin hernia containing a loop of small bowel. Electronically Signed   By: Jorje Guild M.D.   On: 09/02/2022 05:28   DG Chest 1 View  Result Date: 09/02/2022 CLINICAL DATA:  Weakness and urinary frequency EXAM: CHEST  1 VIEW COMPARISON:  06/08/2018 FINDINGS: Stable cardiomediastinal silhouette. Mild pulmonary vascular congestion. No focal consolidation, pleural effusion, or pneumothorax. No acute osseous abnormality. Similar elevation of the right hemidiaphragm. IMPRESSION: Mild pulmonary vascular congestion. Electronically Signed   By: Placido Sou M.D.   On: 09/02/2022 01:16   CT HEAD WO CONTRAST (5MM)  Result Date: 09/02/2022 CLINICAL DATA:  Slid out of bed, head and neck pain EXAM: CT HEAD WITHOUT CONTRAST CT CERVICAL SPINE WITHOUT CONTRAST TECHNIQUE: Multidetector CT imaging of the head and cervical spine was performed following the standard protocol without intravenous contrast. Multiplanar CT image reconstructions of the cervical spine were also generated. RADIATION DOSE REDUCTION: This exam was performed according to the departmental dose-optimization program which includes automated exposure control, adjustment of the mA and/or kV according to patient size and/or use of iterative reconstruction technique. COMPARISON:  No prior head or cervical spine CT  available, correlation is made with MRI head 01/22/2005 FINDINGS: CT HEAD FINDINGS Brain: No evidence of acute infarct, hemorrhage, mass, mass effect, or midline shift. No hydrocephalus or extra-axial fluid collection. Periventricular white matter changes, likely the sequela of chronic small vessel ischemic disease. Vascular: No hyperdense vessel. Skull: Normal. Negative for fracture or focal lesion. Sinuses/Orbits: No acute finding. Status post bilateral lens replacements. Other: The mastoid air cells are  well aerated. CT CERVICAL SPINE FINDINGS Alignment: No traumatic listhesis. Trace, facet mediated anterolisthesis of C7 on T1. Skull base and vertebrae: No acute fracture. No primary bone lesion or focal pathologic process. Partial osseous fusion of C2 and C3, C4 and C5, and C7 and T1. Soft tissues and spinal canal: No prevertebral fluid or swelling. No visible canal hematoma. Disc levels:  No high-grade spinal canal stenosis. Upper chest: Apical pleuroparenchymal scarring. Other: None. IMPRESSION: 1. No acute intracranial process. 2. No acute fracture or traumatic listhesis in the cervical spine. Electronically Signed   By: Merilyn Baba M.D.   On: 09/02/2022 01:15   CT Cervical Spine Wo Contrast  Result Date: 09/02/2022 CLINICAL DATA:  Slid out of bed, head and neck pain EXAM: CT HEAD WITHOUT CONTRAST CT CERVICAL SPINE WITHOUT CONTRAST TECHNIQUE: Multidetector CT imaging of the head and cervical spine was performed following the standard protocol without intravenous contrast. Multiplanar CT image reconstructions of the cervical spine were also generated. RADIATION DOSE REDUCTION: This exam was performed according to the departmental dose-optimization program which includes automated exposure control, adjustment of the mA and/or kV according to patient size and/or use of iterative reconstruction technique. COMPARISON:  No prior head or cervical spine CT available, correlation is made with MRI head 01/22/2005  FINDINGS: CT HEAD FINDINGS Brain: No evidence of acute infarct, hemorrhage, mass, mass effect, or midline shift. No hydrocephalus or extra-axial fluid collection. Periventricular white matter changes, likely the sequela of chronic small vessel ischemic disease. Vascular: No hyperdense vessel. Skull: Normal. Negative for fracture or focal lesion. Sinuses/Orbits: No acute finding. Status post bilateral lens replacements. Other: The mastoid air cells are well aerated. CT CERVICAL SPINE FINDINGS Alignment: No traumatic listhesis. Trace, facet mediated anterolisthesis of C7 on T1. Skull base and vertebrae: No acute fracture. No primary bone lesion or focal pathologic process. Partial osseous fusion of C2 and C3, C4 and C5, and C7 and T1. Soft tissues and spinal canal: No prevertebral fluid or swelling. No visible canal hematoma. Disc levels:  No high-grade spinal canal stenosis. Upper chest: Apical pleuroparenchymal scarring. Other: None. IMPRESSION: 1. No acute intracranial process. 2. No acute fracture or traumatic listhesis in the cervical spine. Electronically Signed   By: Merilyn Baba M.D.   On: 09/02/2022 01:15     PROCEDURES:  Critical Care performed: No      Procedures    IMPRESSION / MDM / ASSESSMENT AND PLAN / ED COURSE  I reviewed the triage vital signs and the nursing notes.    Patient here with generalized weakness.     DIFFERENTIAL DIAGNOSIS (includes but not limited to):   UTI, anemia, electrolyte derangement, ACS, arrhythmia, dehydration, less likely stroke   Patient's presentation is most consistent with acute presentation with potential threat to life or bodily function.   PLAN: Workup initiated from triage.  Patient does have a leukocytosis of 14,000 with left shift but no fever and normal vital signs otherwise.  Chronic kidney disease which is stable.  Urine does appear infected.  Culture pending.  No previous cultures for comparison.  Troponins minimally elevated but  downtrending, flat.  EKG shows incomplete right bundle branch block but no other abnormality.  Incomplete bundle was present in 2009.  CT head, cervical spine, renal all reviewed and interpreted by myself and the radiologist and shows no acute abnormality.  Chest x-ray shows mild vascular congestion without overt edema.  He has no chest pain or shortness of breath.  Discussed admission to the hospital  for weakness and UTI but patient states he was able to ambulate earlier and felt steady and would like to go home.  I feel this is reasonable given he is not septic or toxic.  Will give IM Rocephin here and perform ambulatory trial.  If patient is able to ambulate, will discharge home.   MEDICATIONS GIVEN IN ED: Medications  cefTRIAXone (ROCEPHIN) injection 1 g (1 g Intramuscular Given 09/02/22 0601)  lidocaine (PF) (XYLOCAINE) 1 % injection (2.1 mLs  Given 09/02/22 0601)     ED COURSE: Patient ambulated with strong and steady gait.  Will discharge.   At this time, I do not feel there is any life-threatening condition present. I reviewed all nursing notes, vitals, pertinent previous records.  All lab and urine results, EKGs, imaging ordered have been independently reviewed and interpreted by myself.  I reviewed all available radiology reports from any imaging ordered this visit.  Based on my assessment, I feel the patient is safe to be discharged home without further emergent workup and can continue workup as an outpatient as needed. Discussed all findings, treatment plan as well as usual and customary return precautions.  They verbalize understanding and are comfortable with this plan.  Outpatient follow-up has been provided as needed.  All questions have been answered.    CONSULTS: Admission offered but patient declined.  Ambulating without difficulty here.   OUTSIDE RECORDS REVIEWED: Reviewed patient's last internal medicine visit on 05/17/2022.       FINAL CLINICAL IMPRESSION(S) / ED DIAGNOSES    Final diagnoses:  Acute UTI  Generalized weakness     Rx / DC Orders   ED Discharge Orders          Ordered    cephALEXin (KEFLEX) 500 MG capsule  2 times daily        09/02/22 0607             Note:  This document was prepared using Dragon voice recognition software and may include unintentional dictation errors.   Pari Lombard, Delice Bison, DO 09/02/22 0606    Lucielle Vokes, Delice Bison, DO 09/02/22 [phone]

## 2022-09-02 NOTE — ED Notes (Signed)
Pt assisted to the bathroom.

## 2022-09-02 NOTE — ED Triage Notes (Signed)
Pt arrives via [facility] from home with complaints of weakness and urinary frequency for the last 2-3 days. Pt has a small abrasion to the top of his head which he states could have some from when he slid out of bed. Pt on thinners - No LOC. Denies CP, fevers, cough, nasal congestion, or SOB.

## 2022-09-02 NOTE — ED Notes (Addendum)
First Nurse Note: Pt arrives via [facility] from home with complaints of weakness and urinary frequency for the last 2-3 days. Per EMS pt also "slid out" of bed, no injury didn't hit his head, no LOC.   VS with EMS 126/64 95% RA 98.1  Afib 80

## 2022-09-04 LAB — URINE CULTURE: Culture: >=100000 — AB

## 2022-09-05 ENCOUNTER — Telehealth: Payer: Self-pay

## 2022-09-05 NOTE — Telephone Encounter (Signed)
     Patient  visit on 09/02/2022  at [facility] was for weakness, urinary frequency.  Have you been able to follow up with your primary care physician? Patient stated he spoke with his PCP over the phone.  The patient was or was not able to obtain any needed medicine or equipment.Patient obtained medication.  Are there diet recommendations that you are having difficulty following? No  Patient expresses understanding of discharge instructions and education provided has no other needs at this time.    [place] [facility] Guide   [email or web address]  ?? [phone]   Website: [email or web address]

## 2022-09-18 DIAGNOSIS — M720 Palmar fascial fibromatosis [Dupuytren]: Secondary | ICD-10-CM | POA: Diagnosis not present

## 2022-09-25 DIAGNOSIS — I2510 Atherosclerotic heart disease of native coronary artery without angina pectoris: Secondary | ICD-10-CM | POA: Diagnosis not present

## 2022-09-25 DIAGNOSIS — E782 Mixed hyperlipidemia: Secondary | ICD-10-CM | POA: Diagnosis not present

## 2022-09-25 DIAGNOSIS — I10 Essential (primary) hypertension: Secondary | ICD-10-CM | POA: Diagnosis not present

## 2022-09-25 DIAGNOSIS — I480 Paroxysmal atrial fibrillation: Secondary | ICD-10-CM | POA: Diagnosis not present

## 2022-09-25 DIAGNOSIS — R0602 Shortness of breath: Secondary | ICD-10-CM | POA: Diagnosis not present

## 2022-09-25 DIAGNOSIS — N1831 Chronic kidney disease, stage 3a: Secondary | ICD-10-CM | POA: Diagnosis not present

## 2022-09-25 DIAGNOSIS — R5383 Other fatigue: Secondary | ICD-10-CM | POA: Diagnosis not present

## 2022-09-25 DIAGNOSIS — I340 Nonrheumatic mitral (valve) insufficiency: Secondary | ICD-10-CM | POA: Diagnosis not present

## 2022-09-25 DIAGNOSIS — G4733 Obstructive sleep apnea (adult) (pediatric): Secondary | ICD-10-CM | POA: Diagnosis not present

## 2022-10-18 DIAGNOSIS — I480 Paroxysmal atrial fibrillation: Secondary | ICD-10-CM | POA: Diagnosis not present

## 2022-10-30 DIAGNOSIS — I2510 Atherosclerotic heart disease of native coronary artery without angina pectoris: Secondary | ICD-10-CM | POA: Diagnosis not present

## 2022-10-30 DIAGNOSIS — R399 Unspecified symptoms and signs involving the genitourinary system: Secondary | ICD-10-CM | POA: Diagnosis not present

## 2022-10-30 DIAGNOSIS — R0609 Other forms of dyspnea: Secondary | ICD-10-CM | POA: Diagnosis not present

## 2022-10-30 DIAGNOSIS — R8290 Unspecified abnormal findings in urine: Secondary | ICD-10-CM | POA: Diagnosis not present

## 2022-10-30 DIAGNOSIS — N1831 Chronic kidney disease, stage 3a: Secondary | ICD-10-CM | POA: Diagnosis not present

## 2022-10-30 DIAGNOSIS — I10 Essential (primary) hypertension: Secondary | ICD-10-CM | POA: Diagnosis not present

## 2022-10-30 DIAGNOSIS — E782 Mixed hyperlipidemia: Secondary | ICD-10-CM | POA: Diagnosis not present

## 2022-10-30 DIAGNOSIS — R600 Localized edema: Secondary | ICD-10-CM | POA: Diagnosis not present

## 2022-10-30 DIAGNOSIS — G4733 Obstructive sleep apnea (adult) (pediatric): Secondary | ICD-10-CM | POA: Diagnosis not present

## 2022-10-30 DIAGNOSIS — R5383 Other fatigue: Secondary | ICD-10-CM | POA: Diagnosis not present

## 2022-10-30 DIAGNOSIS — I480 Paroxysmal atrial fibrillation: Secondary | ICD-10-CM | POA: Diagnosis not present

## 2022-11-06 DIAGNOSIS — E782 Mixed hyperlipidemia: Secondary | ICD-10-CM | POA: Diagnosis not present

## 2022-11-06 DIAGNOSIS — L570 Actinic keratosis: Secondary | ICD-10-CM | POA: Diagnosis not present

## 2022-11-06 DIAGNOSIS — I10 Essential (primary) hypertension: Secondary | ICD-10-CM | POA: Diagnosis not present

## 2022-11-06 DIAGNOSIS — R7309 Other abnormal glucose: Secondary | ICD-10-CM | POA: Diagnosis not present

## 2022-11-06 DIAGNOSIS — I2510 Atherosclerotic heart disease of native coronary artery without angina pectoris: Secondary | ICD-10-CM | POA: Diagnosis not present

## 2022-11-06 DIAGNOSIS — R251 Tremor, unspecified: Secondary | ICD-10-CM | POA: Diagnosis not present

## 2022-11-06 DIAGNOSIS — N1831 Chronic kidney disease, stage 3a: Secondary | ICD-10-CM | POA: Diagnosis not present

## 2022-11-06 DIAGNOSIS — Z0000 Encounter for general adult medical examination without abnormal findings: Secondary | ICD-10-CM | POA: Diagnosis not present

## 2022-11-06 DIAGNOSIS — G4709 Other insomnia: Secondary | ICD-10-CM | POA: Diagnosis not present

## 2022-11-06 DIAGNOSIS — R8290 Unspecified abnormal findings in urine: Secondary | ICD-10-CM | POA: Diagnosis not present

## 2022-11-13 DIAGNOSIS — I480 Paroxysmal atrial fibrillation: Secondary | ICD-10-CM | POA: Diagnosis not present

## 2022-11-13 DIAGNOSIS — G4700 Insomnia, unspecified: Secondary | ICD-10-CM | POA: Diagnosis not present

## 2022-11-13 DIAGNOSIS — Z0000 Encounter for general adult medical examination without abnormal findings: Secondary | ICD-10-CM | POA: Diagnosis not present

## 2022-11-13 DIAGNOSIS — R7309 Other abnormal glucose: Secondary | ICD-10-CM | POA: Diagnosis not present

## 2022-11-13 DIAGNOSIS — Z6832 Body mass index (BMI) 32.0-32.9, adult: Secondary | ICD-10-CM | POA: Diagnosis not present

## 2022-11-13 DIAGNOSIS — R600 Localized edema: Secondary | ICD-10-CM | POA: Diagnosis not present

## 2022-11-13 DIAGNOSIS — R251 Tremor, unspecified: Secondary | ICD-10-CM | POA: Diagnosis not present

## 2022-11-13 DIAGNOSIS — N289 Disorder of kidney and ureter, unspecified: Secondary | ICD-10-CM | POA: Diagnosis not present

## 2022-11-13 DIAGNOSIS — I10 Essential (primary) hypertension: Secondary | ICD-10-CM | POA: Diagnosis not present

## 2022-11-19 DIAGNOSIS — Z87440 Personal history of urinary (tract) infections: Secondary | ICD-10-CM | POA: Diagnosis not present

## 2022-11-19 DIAGNOSIS — N390 Urinary tract infection, site not specified: Secondary | ICD-10-CM | POA: Diagnosis not present

## 2022-12-18 DIAGNOSIS — R0609 Other forms of dyspnea: Secondary | ICD-10-CM | POA: Diagnosis not present

## 2022-12-18 DIAGNOSIS — R5383 Other fatigue: Secondary | ICD-10-CM | POA: Diagnosis not present

## 2022-12-18 DIAGNOSIS — N1831 Chronic kidney disease, stage 3a: Secondary | ICD-10-CM | POA: Diagnosis not present

## 2022-12-18 DIAGNOSIS — G4733 Obstructive sleep apnea (adult) (pediatric): Secondary | ICD-10-CM | POA: Diagnosis not present

## 2022-12-18 DIAGNOSIS — I2510 Atherosclerotic heart disease of native coronary artery without angina pectoris: Secondary | ICD-10-CM | POA: Diagnosis not present

## 2022-12-18 DIAGNOSIS — E782 Mixed hyperlipidemia: Secondary | ICD-10-CM | POA: Diagnosis not present

## 2022-12-18 DIAGNOSIS — R600 Localized edema: Secondary | ICD-10-CM | POA: Diagnosis not present

## 2022-12-18 DIAGNOSIS — I480 Paroxysmal atrial fibrillation: Secondary | ICD-10-CM | POA: Diagnosis not present

## 2022-12-18 DIAGNOSIS — I10 Essential (primary) hypertension: Secondary | ICD-10-CM | POA: Diagnosis not present

## 2023-01-07 DIAGNOSIS — R278 Other lack of coordination: Secondary | ICD-10-CM | POA: Diagnosis not present

## 2023-01-07 DIAGNOSIS — R2681 Unsteadiness on feet: Secondary | ICD-10-CM | POA: Diagnosis not present

## 2023-01-07 DIAGNOSIS — I4891 Unspecified atrial fibrillation: Secondary | ICD-10-CM | POA: Diagnosis not present

## 2023-01-07 DIAGNOSIS — M6281 Muscle weakness (generalized): Secondary | ICD-10-CM | POA: Diagnosis not present

## 2023-01-22 DIAGNOSIS — R2681 Unsteadiness on feet: Secondary | ICD-10-CM | POA: Diagnosis not present

## 2023-01-22 DIAGNOSIS — M6281 Muscle weakness (generalized): Secondary | ICD-10-CM | POA: Diagnosis not present

## 2023-01-22 DIAGNOSIS — R278 Other lack of coordination: Secondary | ICD-10-CM | POA: Diagnosis not present

## 2023-01-22 DIAGNOSIS — I4891 Unspecified atrial fibrillation: Secondary | ICD-10-CM | POA: Diagnosis not present

## 2023-01-25 DIAGNOSIS — R278 Other lack of coordination: Secondary | ICD-10-CM | POA: Diagnosis not present

## 2023-01-25 DIAGNOSIS — R2681 Unsteadiness on feet: Secondary | ICD-10-CM | POA: Diagnosis not present

## 2023-01-25 DIAGNOSIS — M6281 Muscle weakness (generalized): Secondary | ICD-10-CM | POA: Diagnosis not present

## 2023-01-25 DIAGNOSIS — I4891 Unspecified atrial fibrillation: Secondary | ICD-10-CM | POA: Diagnosis not present

## 2023-01-28 DIAGNOSIS — R278 Other lack of coordination: Secondary | ICD-10-CM | POA: Diagnosis not present

## 2023-01-28 DIAGNOSIS — R2681 Unsteadiness on feet: Secondary | ICD-10-CM | POA: Diagnosis not present

## 2023-01-28 DIAGNOSIS — M6281 Muscle weakness (generalized): Secondary | ICD-10-CM | POA: Diagnosis not present

## 2023-01-28 DIAGNOSIS — I4891 Unspecified atrial fibrillation: Secondary | ICD-10-CM | POA: Diagnosis not present

## 2023-02-01 DIAGNOSIS — I4891 Unspecified atrial fibrillation: Secondary | ICD-10-CM | POA: Diagnosis not present

## 2023-02-01 DIAGNOSIS — R278 Other lack of coordination: Secondary | ICD-10-CM | POA: Diagnosis not present

## 2023-02-01 DIAGNOSIS — R2681 Unsteadiness on feet: Secondary | ICD-10-CM | POA: Diagnosis not present

## 2023-02-01 DIAGNOSIS — M6281 Muscle weakness (generalized): Secondary | ICD-10-CM | POA: Diagnosis not present

## 2023-02-04 DIAGNOSIS — I4891 Unspecified atrial fibrillation: Secondary | ICD-10-CM | POA: Diagnosis not present

## 2023-02-04 DIAGNOSIS — R278 Other lack of coordination: Secondary | ICD-10-CM | POA: Diagnosis not present

## 2023-02-04 DIAGNOSIS — M6281 Muscle weakness (generalized): Secondary | ICD-10-CM | POA: Diagnosis not present

## 2023-02-04 DIAGNOSIS — R2681 Unsteadiness on feet: Secondary | ICD-10-CM | POA: Diagnosis not present

## 2023-02-08 DIAGNOSIS — R278 Other lack of coordination: Secondary | ICD-10-CM | POA: Diagnosis not present

## 2023-02-08 DIAGNOSIS — R2681 Unsteadiness on feet: Secondary | ICD-10-CM | POA: Diagnosis not present

## 2023-02-08 DIAGNOSIS — I4891 Unspecified atrial fibrillation: Secondary | ICD-10-CM | POA: Diagnosis not present

## 2023-02-08 DIAGNOSIS — M6281 Muscle weakness (generalized): Secondary | ICD-10-CM | POA: Diagnosis not present

## 2023-02-11 DIAGNOSIS — M6281 Muscle weakness (generalized): Secondary | ICD-10-CM | POA: Diagnosis not present

## 2023-02-11 DIAGNOSIS — R278 Other lack of coordination: Secondary | ICD-10-CM | POA: Diagnosis not present

## 2023-02-11 DIAGNOSIS — I4891 Unspecified atrial fibrillation: Secondary | ICD-10-CM | POA: Diagnosis not present

## 2023-02-11 DIAGNOSIS — R2681 Unsteadiness on feet: Secondary | ICD-10-CM | POA: Diagnosis not present

## 2023-02-15 DIAGNOSIS — R278 Other lack of coordination: Secondary | ICD-10-CM | POA: Diagnosis not present

## 2023-02-15 DIAGNOSIS — I4891 Unspecified atrial fibrillation: Secondary | ICD-10-CM | POA: Diagnosis not present

## 2023-02-15 DIAGNOSIS — M6281 Muscle weakness (generalized): Secondary | ICD-10-CM | POA: Diagnosis not present

## 2023-02-15 DIAGNOSIS — R2681 Unsteadiness on feet: Secondary | ICD-10-CM | POA: Diagnosis not present

## 2023-02-22 DIAGNOSIS — M6281 Muscle weakness (generalized): Secondary | ICD-10-CM | POA: Diagnosis not present

## 2023-02-22 DIAGNOSIS — I4891 Unspecified atrial fibrillation: Secondary | ICD-10-CM | POA: Diagnosis not present

## 2023-02-22 DIAGNOSIS — R2681 Unsteadiness on feet: Secondary | ICD-10-CM | POA: Diagnosis not present

## 2023-02-22 DIAGNOSIS — R278 Other lack of coordination: Secondary | ICD-10-CM | POA: Diagnosis not present

## 2023-02-26 DIAGNOSIS — I4891 Unspecified atrial fibrillation: Secondary | ICD-10-CM | POA: Diagnosis not present

## 2023-02-26 DIAGNOSIS — R278 Other lack of coordination: Secondary | ICD-10-CM | POA: Diagnosis not present

## 2023-02-26 DIAGNOSIS — M6281 Muscle weakness (generalized): Secondary | ICD-10-CM | POA: Diagnosis not present

## 2023-02-26 DIAGNOSIS — R2681 Unsteadiness on feet: Secondary | ICD-10-CM | POA: Diagnosis not present

## 2023-03-01 DIAGNOSIS — I4891 Unspecified atrial fibrillation: Secondary | ICD-10-CM | POA: Diagnosis not present

## 2023-03-01 DIAGNOSIS — R278 Other lack of coordination: Secondary | ICD-10-CM | POA: Diagnosis not present

## 2023-03-01 DIAGNOSIS — R2681 Unsteadiness on feet: Secondary | ICD-10-CM | POA: Diagnosis not present

## 2023-03-01 DIAGNOSIS — M6281 Muscle weakness (generalized): Secondary | ICD-10-CM | POA: Diagnosis not present

## 2023-03-04 DIAGNOSIS — R278 Other lack of coordination: Secondary | ICD-10-CM | POA: Diagnosis not present

## 2023-03-04 DIAGNOSIS — I4891 Unspecified atrial fibrillation: Secondary | ICD-10-CM | POA: Diagnosis not present

## 2023-03-04 DIAGNOSIS — R2681 Unsteadiness on feet: Secondary | ICD-10-CM | POA: Diagnosis not present

## 2023-03-04 DIAGNOSIS — M6281 Muscle weakness (generalized): Secondary | ICD-10-CM | POA: Diagnosis not present

## 2023-03-05 DIAGNOSIS — R600 Localized edema: Secondary | ICD-10-CM | POA: Diagnosis not present

## 2023-03-05 DIAGNOSIS — G4700 Insomnia, unspecified: Secondary | ICD-10-CM | POA: Diagnosis not present

## 2023-03-05 DIAGNOSIS — I10 Essential (primary) hypertension: Secondary | ICD-10-CM | POA: Diagnosis not present

## 2023-03-05 DIAGNOSIS — N1831 Chronic kidney disease, stage 3a: Secondary | ICD-10-CM | POA: Diagnosis not present

## 2023-03-05 DIAGNOSIS — R251 Tremor, unspecified: Secondary | ICD-10-CM | POA: Diagnosis not present

## 2023-03-05 DIAGNOSIS — I480 Paroxysmal atrial fibrillation: Secondary | ICD-10-CM | POA: Diagnosis not present

## 2023-03-05 DIAGNOSIS — Z6832 Body mass index (BMI) 32.0-32.9, adult: Secondary | ICD-10-CM | POA: Diagnosis not present

## 2023-03-05 DIAGNOSIS — R7309 Other abnormal glucose: Secondary | ICD-10-CM | POA: Diagnosis not present

## 2023-03-05 DIAGNOSIS — I2510 Atherosclerotic heart disease of native coronary artery without angina pectoris: Secondary | ICD-10-CM | POA: Diagnosis not present

## 2023-03-12 DIAGNOSIS — I480 Paroxysmal atrial fibrillation: Secondary | ICD-10-CM | POA: Diagnosis not present

## 2023-03-12 DIAGNOSIS — F5104 Psychophysiologic insomnia: Secondary | ICD-10-CM | POA: Diagnosis not present

## 2023-03-12 DIAGNOSIS — J019 Acute sinusitis, unspecified: Secondary | ICD-10-CM | POA: Diagnosis not present

## 2023-03-12 DIAGNOSIS — I10 Essential (primary) hypertension: Secondary | ICD-10-CM | POA: Diagnosis not present

## 2023-03-25 DIAGNOSIS — J028 Acute pharyngitis due to other specified organisms: Secondary | ICD-10-CM | POA: Diagnosis not present

## 2023-03-25 DIAGNOSIS — R0982 Postnasal drip: Secondary | ICD-10-CM | POA: Diagnosis not present

## 2023-03-28 DIAGNOSIS — N1831 Chronic kidney disease, stage 3a: Secondary | ICD-10-CM | POA: Diagnosis not present

## 2023-03-28 DIAGNOSIS — R600 Localized edema: Secondary | ICD-10-CM | POA: Diagnosis not present

## 2023-03-28 DIAGNOSIS — I2510 Atherosclerotic heart disease of native coronary artery without angina pectoris: Secondary | ICD-10-CM | POA: Diagnosis not present

## 2023-03-28 DIAGNOSIS — R0609 Other forms of dyspnea: Secondary | ICD-10-CM | POA: Diagnosis not present

## 2023-03-28 DIAGNOSIS — I480 Paroxysmal atrial fibrillation: Secondary | ICD-10-CM | POA: Diagnosis not present

## 2023-03-28 DIAGNOSIS — E782 Mixed hyperlipidemia: Secondary | ICD-10-CM | POA: Diagnosis not present

## 2023-03-28 DIAGNOSIS — R0602 Shortness of breath: Secondary | ICD-10-CM | POA: Diagnosis not present

## 2023-03-28 DIAGNOSIS — I340 Nonrheumatic mitral (valve) insufficiency: Secondary | ICD-10-CM | POA: Diagnosis not present

## 2023-03-28 DIAGNOSIS — I10 Essential (primary) hypertension: Secondary | ICD-10-CM | POA: Diagnosis not present

## 2023-04-01 DIAGNOSIS — R278 Other lack of coordination: Secondary | ICD-10-CM | POA: Diagnosis not present

## 2023-04-01 DIAGNOSIS — I4891 Unspecified atrial fibrillation: Secondary | ICD-10-CM | POA: Diagnosis not present

## 2023-04-01 DIAGNOSIS — M6281 Muscle weakness (generalized): Secondary | ICD-10-CM | POA: Diagnosis not present

## 2023-04-01 DIAGNOSIS — R2681 Unsteadiness on feet: Secondary | ICD-10-CM | POA: Diagnosis not present

## 2023-04-05 DIAGNOSIS — M6281 Muscle weakness (generalized): Secondary | ICD-10-CM | POA: Diagnosis not present

## 2023-04-05 DIAGNOSIS — R278 Other lack of coordination: Secondary | ICD-10-CM | POA: Diagnosis not present

## 2023-04-05 DIAGNOSIS — I4891 Unspecified atrial fibrillation: Secondary | ICD-10-CM | POA: Diagnosis not present

## 2023-04-05 DIAGNOSIS — R2681 Unsteadiness on feet: Secondary | ICD-10-CM | POA: Diagnosis not present

## 2023-04-08 DIAGNOSIS — R278 Other lack of coordination: Secondary | ICD-10-CM | POA: Diagnosis not present

## 2023-04-08 DIAGNOSIS — I4891 Unspecified atrial fibrillation: Secondary | ICD-10-CM | POA: Diagnosis not present

## 2023-04-08 DIAGNOSIS — R2681 Unsteadiness on feet: Secondary | ICD-10-CM | POA: Diagnosis not present

## 2023-04-08 DIAGNOSIS — M6281 Muscle weakness (generalized): Secondary | ICD-10-CM | POA: Diagnosis not present

## 2023-04-09 ENCOUNTER — Other Ambulatory Visit: Payer: Self-pay

## 2023-04-09 ENCOUNTER — Inpatient Hospital Stay
Admission: EM | Admit: 2023-04-09 | Discharge: 2023-04-15 | DRG: 065 | Disposition: A | Discharge status: Skilled Nursing Facility | Payer: Medicare HMO | Source: Skilled Nursing Facility | Attending: Internal Medicine | Admitting: Internal Medicine

## 2023-04-09 DIAGNOSIS — Z9079 Acquired absence of other genital organ(s): Secondary | ICD-10-CM

## 2023-04-09 DIAGNOSIS — L03115 Cellulitis of right lower limb: Secondary | ICD-10-CM | POA: Diagnosis not present

## 2023-04-09 DIAGNOSIS — D696 Thrombocytopenia, unspecified: Secondary | ICD-10-CM | POA: Diagnosis not present

## 2023-04-09 DIAGNOSIS — C4490 Unspecified malignant neoplasm of skin, unspecified: Secondary | ICD-10-CM | POA: Diagnosis present

## 2023-04-09 DIAGNOSIS — I6782 Cerebral ischemia: Secondary | ICD-10-CM | POA: Diagnosis not present

## 2023-04-09 DIAGNOSIS — I63422 Cerebral infarction due to embolism of left anterior cerebral artery: Secondary | ICD-10-CM | POA: Diagnosis not present

## 2023-04-09 DIAGNOSIS — Z87891 Personal history of nicotine dependence: Secondary | ICD-10-CM | POA: Diagnosis not present

## 2023-04-09 DIAGNOSIS — R29704 NIHSS score 4: Secondary | ICD-10-CM | POA: Diagnosis not present

## 2023-04-09 DIAGNOSIS — R600 Localized edema: Secondary | ICD-10-CM | POA: Diagnosis not present

## 2023-04-09 DIAGNOSIS — I672 Cerebral atherosclerosis: Secondary | ICD-10-CM | POA: Diagnosis not present

## 2023-04-09 DIAGNOSIS — Z9841 Cataract extraction status, right eye: Secondary | ICD-10-CM | POA: Diagnosis not present

## 2023-04-09 DIAGNOSIS — R531 Weakness: Secondary | ICD-10-CM | POA: Diagnosis not present

## 2023-04-09 DIAGNOSIS — R4781 Slurred speech: Secondary | ICD-10-CM | POA: Diagnosis not present

## 2023-04-09 DIAGNOSIS — Z9842 Cataract extraction status, left eye: Secondary | ICD-10-CM

## 2023-04-09 DIAGNOSIS — I4891 Unspecified atrial fibrillation: Secondary | ICD-10-CM | POA: Diagnosis not present

## 2023-04-09 DIAGNOSIS — I129 Hypertensive chronic kidney disease with stage 1 through stage 4 chronic kidney disease, or unspecified chronic kidney disease: Secondary | ICD-10-CM | POA: Diagnosis present

## 2023-04-09 DIAGNOSIS — I480 Paroxysmal atrial fibrillation: Secondary | ICD-10-CM | POA: Diagnosis not present

## 2023-04-09 DIAGNOSIS — Z1152 Encounter for screening for COVID-19: Secondary | ICD-10-CM

## 2023-04-09 DIAGNOSIS — Z66 Do not resuscitate: Secondary | ICD-10-CM | POA: Diagnosis not present

## 2023-04-09 DIAGNOSIS — I69320 Aphasia following cerebral infarction: Secondary | ICD-10-CM | POA: Diagnosis not present

## 2023-04-09 DIAGNOSIS — Z6829 Body mass index (BMI) 29.0-29.9, adult: Secondary | ICD-10-CM | POA: Diagnosis not present

## 2023-04-09 DIAGNOSIS — R000 Tachycardia, unspecified: Secondary | ICD-10-CM | POA: Diagnosis present

## 2023-04-09 DIAGNOSIS — W19XXXA Unspecified fall, initial encounter: Secondary | ICD-10-CM | POA: Diagnosis present

## 2023-04-09 DIAGNOSIS — I951 Orthostatic hypotension: Secondary | ICD-10-CM

## 2023-04-09 DIAGNOSIS — Z880 Allergy status to penicillin: Secondary | ICD-10-CM

## 2023-04-09 DIAGNOSIS — Z79899 Other long term (current) drug therapy: Secondary | ICD-10-CM | POA: Diagnosis not present

## 2023-04-09 DIAGNOSIS — M6281 Muscle weakness (generalized): Secondary | ICD-10-CM | POA: Diagnosis not present

## 2023-04-09 DIAGNOSIS — N1832 Chronic kidney disease, stage 3b: Secondary | ICD-10-CM | POA: Diagnosis present

## 2023-04-09 DIAGNOSIS — Z87442 Personal history of urinary calculi: Secondary | ICD-10-CM

## 2023-04-09 DIAGNOSIS — Z96652 Presence of left artificial knee joint: Secondary | ICD-10-CM | POA: Diagnosis present

## 2023-04-09 DIAGNOSIS — R29810 Facial weakness: Secondary | ICD-10-CM | POA: Diagnosis not present

## 2023-04-09 DIAGNOSIS — G8191 Hemiplegia, unspecified affecting right dominant side: Secondary | ICD-10-CM | POA: Diagnosis not present

## 2023-04-09 DIAGNOSIS — N179 Acute kidney failure, unspecified: Secondary | ICD-10-CM

## 2023-04-09 DIAGNOSIS — I639 Cerebral infarction, unspecified: Secondary | ICD-10-CM | POA: Diagnosis not present

## 2023-04-09 DIAGNOSIS — R41841 Cognitive communication deficit: Secondary | ICD-10-CM | POA: Diagnosis not present

## 2023-04-09 DIAGNOSIS — L0390 Cellulitis, unspecified: Secondary | ICD-10-CM

## 2023-04-09 DIAGNOSIS — I6621 Occlusion and stenosis of right posterior cerebral artery: Secondary | ICD-10-CM | POA: Diagnosis not present

## 2023-04-09 DIAGNOSIS — N189 Chronic kidney disease, unspecified: Secondary | ICD-10-CM | POA: Diagnosis not present

## 2023-04-09 DIAGNOSIS — L989 Disorder of the skin and subcutaneous tissue, unspecified: Secondary | ICD-10-CM | POA: Diagnosis not present

## 2023-04-09 DIAGNOSIS — E785 Hyperlipidemia, unspecified: Secondary | ICD-10-CM | POA: Diagnosis present

## 2023-04-09 DIAGNOSIS — Z7901 Long term (current) use of anticoagulants: Secondary | ICD-10-CM

## 2023-04-09 DIAGNOSIS — I10 Essential (primary) hypertension: Secondary | ICD-10-CM | POA: Diagnosis not present

## 2023-04-09 DIAGNOSIS — E663 Overweight: Secondary | ICD-10-CM | POA: Diagnosis not present

## 2023-04-09 DIAGNOSIS — R609 Edema, unspecified: Secondary | ICD-10-CM | POA: Diagnosis not present

## 2023-04-09 DIAGNOSIS — L03119 Cellulitis of unspecified part of limb: Secondary | ICD-10-CM | POA: Diagnosis not present

## 2023-04-09 DIAGNOSIS — R279 Unspecified lack of coordination: Secondary | ICD-10-CM | POA: Diagnosis not present

## 2023-04-09 DIAGNOSIS — I2510 Atherosclerotic heart disease of native coronary artery without angina pectoris: Secondary | ICD-10-CM | POA: Diagnosis not present

## 2023-04-09 DIAGNOSIS — R2689 Other abnormalities of gait and mobility: Secondary | ICD-10-CM | POA: Diagnosis not present

## 2023-04-09 LAB — URINALYSIS, ROUTINE W REFLEX MICROSCOPIC
Bilirubin Urine: NEGATIVE
Glucose, UA: NEGATIVE mg/dL
Hgb urine dipstick: NEGATIVE
Ketones, ur: NEGATIVE mg/dL
Leukocytes,Ua: NEGATIVE
Nitrite: NEGATIVE
Protein, ur: NEGATIVE mg/dL
Specific Gravity, Urine: 1.009 (ref 1.005–1.030)
pH: 6 (ref 5.0–8.0)

## 2023-04-09 LAB — CBC
HCT: 39.9 % (ref 39.0–52.0)
Hemoglobin: 13.1 g/dL (ref 13.0–17.0)
MCH: 30 pg (ref 26.0–34.0)
MCHC: 32.8 g/dL (ref 30.0–36.0)
MCV: 91.3 fL (ref 80.0–100.0)
Platelets: 135 10*3/uL — ABNORMAL LOW (ref 150–400)
RBC: 4.37 MIL/uL (ref 4.22–5.81)
RDW: 14.1 % (ref 11.5–15.5)
WBC: 5.9 10*3/uL (ref 4.0–10.5)
nRBC: 0 % (ref 0.0–0.2)

## 2023-04-09 LAB — BASIC METABOLIC PANEL
Anion gap: 6 (ref 5–15)
BUN: 30 mg/dL — ABNORMAL HIGH (ref 8–23)
CO2: 29 mmol/L (ref 22–32)
Calcium: 8.5 mg/dL — ABNORMAL LOW (ref 8.9–10.3)
Chloride: 100 mmol/L (ref 98–111)
Creatinine, Ser: 1.54 mg/dL — ABNORMAL HIGH (ref 0.61–1.24)
GFR, Estimated: 42 mL/min — ABNORMAL LOW (ref >60)
Glucose, Bld: 102 mg/dL — ABNORMAL HIGH (ref 70–99)
Potassium: 3.9 mmol/L (ref 3.5–5.1)
Sodium: 135 mmol/L (ref 135–145)

## 2023-04-09 LAB — SARS CORONAVIRUS 2 BY RT PCR: SARS Coronavirus 2 by RT PCR: NEGATIVE

## 2023-04-09 LAB — TROPONIN I (HIGH SENSITIVITY): Troponin I (High Sensitivity): 22 ng/L — ABNORMAL HIGH (ref <18)

## 2023-04-09 MED ORDER — ACETAMINOPHEN 650 MG RE SUPP: 650.0000 mg | Freq: Four times a day (QID) | RECTAL | Status: DC | PRN

## 2023-04-09 MED ORDER — SODIUM CHLORIDE 0.9 % IV SOLN
1.0000 g | INTRAVENOUS | Status: DC
  Administered 2023-04-10 – 2023-04-11 (×2): 1 g via INTRAVENOUS
  Filled 2023-04-09 (×3): qty 10

## 2023-04-09 MED ORDER — ONDANSETRON HCL 4 MG PO TABS: 4.0000 mg | ORAL_TABLET | Freq: Four times a day (QID) | ORAL | Status: DC | PRN

## 2023-04-09 MED ORDER — ACETAMINOPHEN 325 MG PO TABS
650.0000 mg | ORAL_TABLET | Freq: Four times a day (QID) | ORAL | Status: DC | PRN
  Administered 2023-04-09: 650 mg via ORAL
  Filled 2023-04-09: qty 2

## 2023-04-09 MED ORDER — FERROUS SULFATE 325 (65 FE) MG PO TABS
325.0000 mg | ORAL_TABLET | Freq: Every day | ORAL | Status: DC
  Administered 2023-04-10 – 2023-04-15 (×6): 325 mg via ORAL
  Filled 2023-04-09 (×6): qty 1

## 2023-04-09 MED ORDER — LORATADINE 10 MG PO TABS
10.0000 mg | ORAL_TABLET | Freq: Every day | ORAL | Status: DC
  Administered 2023-04-10 – 2023-04-15 (×6): 10 mg via ORAL
  Filled 2023-04-09 (×6): qty 1

## 2023-04-09 MED ORDER — SODIUM CHLORIDE 0.9 % IV SOLN
1.0000 g | Freq: Once | INTRAVENOUS | Status: AC
  Administered 2023-04-09: 1 g via INTRAVENOUS
  Filled 2023-04-09: qty 10

## 2023-04-09 MED ORDER — SODIUM CHLORIDE 0.9 % IV BOLUS
500.0000 mL | Freq: Once | INTRAVENOUS | Status: AC
  Administered 2023-04-09: 500 mL via INTRAVENOUS

## 2023-04-09 MED ORDER — SODIUM CHLORIDE 0.9% FLUSH
3.0000 mL | Freq: Two times a day (BID) | INTRAVENOUS | Status: DC
  Administered 2023-04-09 – 2023-04-15 (×10): 3 mL via INTRAVENOUS

## 2023-04-09 MED ORDER — DIPHENHYDRAMINE HCL 25 MG PO CAPS
25.0000 mg | ORAL_CAPSULE | Freq: Every evening | ORAL | Status: DC | PRN
  Administered 2023-04-09: 25 mg via ORAL
  Filled 2023-04-09: qty 1

## 2023-04-09 MED ORDER — VERAPAMIL HCL ER 240 MG PO TBCR
240.0000 mg | EXTENDED_RELEASE_TABLET | Freq: Every day | ORAL | Status: DC
  Administered 2023-04-09 – 2023-04-10 (×2): 240 mg via ORAL
  Filled 2023-04-09 (×3): qty 1

## 2023-04-09 MED ORDER — ZOLPIDEM TARTRATE 5 MG PO TABS: 5.0000 mg | ORAL_TABLET | Freq: Every day | ORAL | Status: DC

## 2023-04-09 MED ORDER — POLYETHYLENE GLYCOL 3350 17 G PO PACK: 17.0000 g | PACK | Freq: Every day | ORAL | Status: DC | PRN

## 2023-04-09 MED ORDER — APIXABAN 2.5 MG PO TABS
2.5000 mg | ORAL_TABLET | Freq: Two times a day (BID) | ORAL | Status: DC
  Administered 2023-04-09 – 2023-04-10 (×3): 2.5 mg via ORAL
  Filled 2023-04-09 (×3): qty 1

## 2023-04-09 MED ORDER — LACTATED RINGERS IV SOLN: INTRAVENOUS | Status: AC

## 2023-04-09 MED ORDER — ONDANSETRON HCL 4 MG/2ML IJ SOLN: 4.0000 mg | Freq: Four times a day (QID) | INTRAMUSCULAR | Status: DC | PRN

## 2023-04-09 NOTE — ED Notes (Addendum)
Pt was able to perform orthostatic vitals but struggled to get in and out of the bed due to weakness.

## 2023-04-09 NOTE — Assessment & Plan Note (Addendum)
Likely secondary to acute stroke and orthostatic hypotension.  Continue working with PT and OT.

## 2023-04-09 NOTE — Assessment & Plan Note (Addendum)
Right lower extremity.  Will finish a 5-day course today.

## 2023-04-09 NOTE — Assessment & Plan Note (Addendum)
TED hose.

## 2023-04-09 NOTE — H&P (Signed)
History and Physical    Patient: Ryan Pace:[id number] DOB: [birth year] DOA: 04/09/2023 DOS: the patient was seen and examined on 04/09/2023 PCP: Barbette Reichmann, MD  Patient coming from: ALF/ILF  Chief Complaint:  Chief Complaint  Patient presents with   Weakness   HPI: Ryan Pace is a 89 y.o. male with medical history significant of atrial fibrillation on Eliquis, chronic kidney disease stage IIIb, hypertension, hyperlipidemia, nephrolithiasis, anemia, who presents to the ED due to weakness.  Ryan Pace states that beginning today, he noticed that when he sat on the recliner and went to go stand up, he was unable to do so.  He has particular weakness in his bilateral lower extremities, but feels that his bilateral upper extremities also feel weaker than usual.  He notes difficult workout session yesterday with physical therapy and believes he did not hydrate very well over the last 24 hours.  Otherwise he denies any acute symptoms, including dizziness, headache, chest pain, shortness of breath, palpitations, dysuria, nausea, vomiting, abdominal pain, or localized weakness.  ED course: On arrival to the ED, patient was hypertensive at 162/89 with heart rate of 75.  He was saturating at 95% on room air.  He was afebrile at 98.2.  Workup demonstrates platelets 135, glucose 102, BUN 30, creatinine 1.54 with GFR 42.  Urinalysis with no leukocytes, nitrites.  Troponin at 22 within patient's baseline.  Due to concern for cellulitis, Rocephin was started.  Due to generalized weakness with positive orthostatics, [facility] contacted for admission.  Review of Systems: As mentioned in the history of present illness. All other systems reviewed and are negative.  Past Medical History:  Diagnosis Date   Anemia    Dysrhythmia    atrial fibrillation   Headache    Hyperlipidemia    Kidney stones    Prostatitis    Past Surgical History:  Procedure Laterality Date   APPENDECTOMY      CARDIAC CATHETERIZATION     CATARACT EXTRACTION, BILATERAL     COLONOSCOPY     1998, 2003, 2011   DUPUYTREN CONTRACTURE RELEASE Bilateral    DUPUYTREN CONTRACTURE RELEASE Right 01/11/2016   Procedure: DUPUYTREN CONTRACTURE RELEASE;  Surgeon: Deeann Saint, MD;  Location: [facility] OR[facility];  Service: Orthopedics;  Laterality: Right;   FLEXIBLE SIGMOIDOSCOPY     1993   GREEN LIGHT LASER TURP (TRANSURETHRAL RESECTION OF PROSTATE N/A 02/15/2015   Procedure: GREEN LIGHT LASER TURP (TRANSURETHRAL RESECTION OF PROSTATE;  Surgeon: Orson Ape, MD;  Location: [facility] OR[facility];  Service: Urology;  Laterality: N/A;   TONSILLECTOMY     TOTAL KNEE ARTHROPLASTY Left 10/29/2018   Procedure: TOTAL KNEE ARTHROPLASTY-LEFT;  Surgeon: Lyndle Herrlich, MD;  Location: [facility] ORS;  Service: Orthopedics;  Laterality: Left;   Social History:  reports that he has never smoked. He has quit using smokeless tobacco. He reports that he does not drink alcohol and does not use drugs.  Allergies  Allergen Reactions   Penicillins Rash    Did it involve swelling of the face/tongue/throat, SOB, or low BP? Unknown Did it involve sudden or severe rash/hives, skin peeling, or any reaction on the inside of your mouth or nose? Unknown Did you need to seek medical attention at a hospital or doctor's office? Unknown When did it last happen?    Teenager   If all above answers are "NO", may proceed with cephalosporin use.     History reviewed. No pertinent family history.  Prior to Admission medications   Medication  Sig Start Date End Date Taking? Authorizing Provider  apixaban (ELIQUIS) 5 MG TABS tablet Take by mouth. 06/22/19   [provider]  cephALEXin (KEFLEX) 500 MG capsule Take 1 capsule (500 mg total) by mouth 2 (two) times daily. 09/02/22   Ward, Layla Maw, DO  cetirizine (ZYRTEC) 5 MG tablet Take 10 mg by mouth daily.     [provider]  cholecalciferol (VITAMIN D) 1000 units tablet Take 1,000 Units by mouth daily.      [provider]  ferrous sulfate 325 (65 FE) MG EC tablet Take 325 mg by mouth daily.    [provider]  lisinopril (PRINIVIL,ZESTRIL) 5 MG tablet Take 5 mg by mouth daily.     [provider]  metoprolol tartrate (LOPRESSOR) 25 MG tablet TAKE 1 TABLET BY MOUTH TWICE A DAY 09/07/19   [provider]  traZODone (DESYREL) 50 MG tablet Take 100 mg by mouth at bedtime.    [provider]  verapamil (CALAN) 40 MG tablet Take 40 mg by mouth 2 (two) times daily.     [provider]  zolpidem (AMBIEN) 5 MG tablet Take 5-10 mg by mouth at bedtime.    [provider]    Physical Exam: Vitals:   04/09/23 1329 04/09/23 1330 04/09/23 1346 04/09/23 1400  BP:  (!) 155/82  (!) 162/89  Pulse:  90  81  Resp:  19  16  Temp:   98.2 F (36.8 C)   TempSrc:   Oral   SpO2:  97%  95%  Weight: 90.7 kg     Height: 5\' 9"  (1.753 m)      Physical Exam Vitals and nursing note reviewed.  Constitutional:      General: He is not in acute distress.    Appearance: He is normal weight.  HENT:     Head: Normocephalic and atraumatic.     Mouth/Throat:     Mouth: Mucous membranes are dry.     Pharynx: Oropharynx is clear.  Eyes:     Conjunctiva/sclera: Conjunctivae normal.     Pupils: Pupils are equal, round, and reactive to light.  Cardiovascular:     Rate and Rhythm: Normal rate. Rhythm irregular.     Heart sounds: No murmur heard. Pulmonary:     Effort: Pulmonary effort is normal. No respiratory distress.     Breath sounds: Normal breath sounds. No wheezing, rhonchi or rales.  Abdominal:     General: Bowel sounds are normal.     Palpations: Abdomen is soft.  Musculoskeletal:     Right lower leg: 1+ Pitting Edema present.     Left lower leg: 1+ Pitting Edema present.  Skin:    Comments: Erythema on the inferior aspect of bilateral lower extremity.  On the right, appears more acute than on the left.  Neurological:     Mental Status: He is  alert and oriented to person, place, and time.     Comments: 4/5 strength throughout equally. No facial asymmetry or dysarthria.   Psychiatric:        Mood and Affect: Mood normal.        Behavior: Behavior normal.    Data Reviewed: CBC with WBC of 5.9, hemoglobin 13.1, platelets 135 BMP with sodium of 135, potassium 3.9, glucose 102, BUN 30, creatinine 1.54 with GFR 42 Troponin 22  EKG personally reviewed.  Sinus rhythm with rate of 90.  Left axis deviation.  No acute ST or T wave changes concerning  for acute ischemia.  There are no new results to review at this time.  Assessment and Plan:  Generalized weakness Patient is presenting with generalized weakness that began today with no focalizing symptoms to suggest central process.  Orthostatics are positive, likely culprit of his weakness.  However, patient did have a few pauses on telemetry during examination, so will continue telemetry for now.   - Telemetry monitoring - PT/OT - Management of orthostatic hypotension as noted below  Orthostatic hypotension Unclear etiology; potentially due to difficult PT session yesterday and poor hydration versus multiple antihypertensives with daily Lasix.  He was recently changed from metoprolol/lisinopril/amlodipine to lisinopril/verapamil. Lisinopril dose was increased.   - Gentle IV fluids - Repeat orthostatic vital signs in the a.m. - Continue home verapamil but hold home lisinopril  Cellulitis There was concern for cellulitis on the right lower extremity, however appears more consistent with venous stasis changes.  Given unclear timeline of onset, will complete a course of treatment for cellulitis though.  - Continue Rocephin at this time - Transition to oral Keflex on discharge  Bilateral lower extremity edema Patient has a history of chronic bilateral lower extremity edema on as needed Lasix.  No history of congestive heart failure.  Likely venous stasis.  - Bilateral TED  stockings ordered - Hold home Lasix for now  Chronic kidney disease, stage 3b (HCC) Stable renal function at this time.  Paroxysmal A-fib (HCC) - Continue home verapamil and Eliquis  Essential hypertension - Continue home verapamil - Hold home lisinopril  Advance Care Planning:   Code Status: DNR/DNI.  Patient states that he would not want any kind of resuscitation in the setting of cardiac or pulmonary arrest.  He has discussed these wishes with his family.  Patient's son at bedside confirms.  Consults: None  Family Communication: Patient's son updated at bedside  Severity of Illness: The appropriate patient status for this patient is OBSERVATION. Observation status is judged to be reasonable and necessary in order to provide the required intensity of service to ensure the patient's safety. The patient's presenting symptoms, physical exam findings, and initial radiographic and laboratory data in the context of their medical condition is felt to place them at decreased risk for further clinical deterioration. Furthermore, it is anticipated that the patient will be medically stable for discharge from the hospital within 2 midnights of admission.   Author: Verdene Lennert, MD 04/09/2023 4:32 PM  For on call review [email or web address].

## 2023-04-09 NOTE — Assessment & Plan Note (Signed)
Continue Calan and hold lisinopril.

## 2023-04-09 NOTE — Assessment & Plan Note (Addendum)
Continue verapamil at increased dose.  Restarted Eliquis on 8/16 at 5 mg twice daily.

## 2023-04-09 NOTE — ED Triage Notes (Signed)
Pt from independent living at [facility] via [facility]. Weakness developed in his legs overnight causing him to fall around 12;30pm. Pt has some edema in his legs as well. Vitals were normal for EMS. CBG was 117.

## 2023-04-09 NOTE — Assessment & Plan Note (Addendum)
TED hose.  Holding lisinopril.  Continue Calan SR.  Check orthostatics before adding BP meds.

## 2023-04-09 NOTE — ED Provider Notes (Signed)
[facility] Provider Note    Event Date/Time   First MD Initiated Contact with Patient 04/09/23 1328     (approximate)   History   Weakness   HPI  Ryan Pace is a 89 y.o. male with a history of hyperlipidemia, anemia, atrial fibrillation, and kidney stones who presents with generalized weakness, acute onset today.  The patient states that he initially got up and was able to move around without difficulty.  He sat down in a chair around 9 AM and then subsequently fell when he tried to get back up.  Since that time he has been able to stand but not walk and feels weak and lightheaded.  He also reports increased bilateral lower extremity swelling.  The patient denies cough, fever, shortness of breath, or chest pain.  He has no vomiting or diarrhea.  Denies any focal weakness or numbness to either side.  I reviewed the past medical records per the patient's most recent outpatient encounter was with follow-up with Dr. Juliann Pares from cardiology for his chronic conditions on 8/1.   Physical Exam   Triage Vital Signs: ED Triage Vitals  Encounter Vitals Group     BP 04/09/23 1330 (!) 155/82     Systolic BP Percentile --      Diastolic BP Percentile --      Pulse Rate 04/09/23 1327 91     Resp 04/09/23 1327 16     Temp 04/09/23 1346 98.2 F (36.8 C)     Temp Source 04/09/23 1346 Oral     SpO2 04/09/23 1327 96 %     Weight 04/09/23 1329 200 lb (90.7 kg)     Height 04/09/23 1329 5\' 9"  (1.753 m)     Head Circumference --      Peak Flow --      Pain Score 04/09/23 1329 0     Pain Loc --      Pain Education --      Exclude from Growth Chart --     Most recent vital signs: Vitals:   04/09/23 1346 04/09/23 1400  BP:  (!) 162/89  Pulse:  81  Resp:  16  Temp: 98.2 F (36.8 C)   SpO2:  95%     General: Awake, no distress.  CV:  Good peripheral perfusion.  Resp:  Normal effort.  Abd:  No distention.  Other:  1+ bilateral lower extremity edema.   Area of erythema, induration, and warmth to the distal right leg.  5/5 motor strength and intact sensation all extremities.  Normal speech.  No facial droop.  EOMI.  PERRLA.  No pronator drift.  No ataxia.   ED Results / Procedures / Treatments   Labs (all labs ordered are listed, but only abnormal results are displayed) Labs Reviewed  BASIC METABOLIC PANEL - Abnormal; Notable for the following components:      Result Value   Glucose, Bld 102 (*)    BUN 30 (*)    Creatinine, Ser 1.54 (*)    Calcium 8.5 (*)    GFR, Estimated 42 (*)    All other components within normal limits  CBC - Abnormal; Notable for the following components:   Platelets 135 (*)    All other components within normal limits  URINALYSIS, ROUTINE W REFLEX MICROSCOPIC - Abnormal; Notable for the following components:   Color, Urine YELLOW (*)    APPearance CLEAR (*)    All other components within normal limits  TROPONIN  I (HIGH SENSITIVITY) - Abnormal; Notable for the following components:   Troponin I (High Sensitivity) 22 (*)    All other components within normal limits  SARS CORONAVIRUS 2 BY RT PCR  CBG MONITORING, ED  TROPONIN I (HIGH SENSITIVITY)     EKG  ED ECG REPORT I, Dionne Bucy, the attending physician, personally viewed and interpreted this ECG.  Date: 04/09/2023 EKG Time: 1328 Rate: 90 Rhythm: normal sinus rhythm QRS Axis: Left axis Intervals: normal ST/T Wave abnormalities: normal Narrative Interpretation: no evidence of acute ischemia    RADIOLOGY   PROCEDURES:  Critical Care performed: No  Procedures   MEDICATIONS ORDERED IN ED: Medications  sodium chloride flush (NS) 0.9 % injection 3 mL (has no administration in time range)  acetaminophen (TYLENOL) tablet 650 mg (has no administration in time range)    Or  acetaminophen (TYLENOL) suppository 650 mg (has no administration in time range)  polyethylene glycol (MIRALAX / GLYCOLAX) packet 17 g (has no administration  in time range)  ondansetron (ZOFRAN) tablet 4 mg (has no administration in time range)    Or  ondansetron (ZOFRAN) injection 4 mg (has no administration in time range)  cefTRIAXone (ROCEPHIN) 1 g in sodium chloride 0.9 % 100 mL IVPB (0 g Intravenous Stopped 04/09/23 1559)  sodium chloride 0.9 % bolus 500 mL (500 mLs Intravenous New Bag/Given 04/09/23 1555)     IMPRESSION / MDM / ASSESSMENT AND PLAN / ED COURSE  I reviewed the triage vital signs and the nursing notes.  89 year old male with PMH as noted above presents with generalized weakness and difficulty getting up today.  The patient states that he fell back into his chair but did not hit his head or lose consciousness.  He reports increased leg swelling.  On exam he has some peripheral edema.  There is an area of erythema and induration to the right lower leg.  Neurologic exam is nonfocal.  Differential diagnosis includes, but is not limited to, dehydration, electrolyte abnormality, other metabolic disturbance, UTI, COVID, cellulitis, other infection, less likely cardiac cause.  We will obtain lab workup, give fluids, and reassess.  Patient's presentation is most consistent with acute complicated illness / injury requiring diagnostic workup.  The patient is on the cardiac monitor to evaluate for evidence of arrhythmia and/or significant heart rate changes.  ----------------------------------------- 4:25 PM on 04/09/2023 -----------------------------------------  The patient has positive orthostatics and feels very weak when he stands up.  Lab workup is overall relatively reassuring.  Creatinine is at baseline.  There is no leukocytosis.  Urinalysis is negative.  COVID is still pending.  I have ordered ceftriaxone for possible right lower extremity cellulitis.  I consulted Dr. Huel Cote from the hospitalist service; based on our discussion she agrees to evaluate the patient for admission.   FINAL CLINICAL IMPRESSION(S) / ED DIAGNOSES    Final diagnoses:  Cellulitis of right leg  Generalized weakness     Rx / DC Orders   ED Discharge Orders     None        Note:  This document was prepared using Dragon voice recognition software and may include unintentional dictation errors.    Dionne Bucy, MD 04/09/23 1626

## 2023-04-09 NOTE — Assessment & Plan Note (Signed)
Stable renal function at this time.

## 2023-04-10 DIAGNOSIS — Z880 Allergy status to penicillin: Secondary | ICD-10-CM | POA: Diagnosis not present

## 2023-04-10 DIAGNOSIS — L03115 Cellulitis of right lower limb: Secondary | ICD-10-CM | POA: Diagnosis present

## 2023-04-10 DIAGNOSIS — R29704 NIHSS score 4: Secondary | ICD-10-CM | POA: Diagnosis not present

## 2023-04-10 DIAGNOSIS — G8191 Hemiplegia, unspecified affecting right dominant side: Secondary | ICD-10-CM | POA: Diagnosis present

## 2023-04-10 DIAGNOSIS — Z7901 Long term (current) use of anticoagulants: Secondary | ICD-10-CM | POA: Diagnosis not present

## 2023-04-10 DIAGNOSIS — N1832 Chronic kidney disease, stage 3b: Secondary | ICD-10-CM | POA: Diagnosis present

## 2023-04-10 DIAGNOSIS — N179 Acute kidney failure, unspecified: Secondary | ICD-10-CM | POA: Diagnosis present

## 2023-04-10 DIAGNOSIS — I951 Orthostatic hypotension: Secondary | ICD-10-CM | POA: Diagnosis present

## 2023-04-10 DIAGNOSIS — Z9842 Cataract extraction status, left eye: Secondary | ICD-10-CM | POA: Diagnosis not present

## 2023-04-10 DIAGNOSIS — Z96652 Presence of left artificial knee joint: Secondary | ICD-10-CM | POA: Diagnosis present

## 2023-04-10 DIAGNOSIS — I480 Paroxysmal atrial fibrillation: Secondary | ICD-10-CM | POA: Diagnosis present

## 2023-04-10 DIAGNOSIS — R600 Localized edema: Secondary | ICD-10-CM | POA: Diagnosis present

## 2023-04-10 DIAGNOSIS — I129 Hypertensive chronic kidney disease with stage 1 through stage 4 chronic kidney disease, or unspecified chronic kidney disease: Secondary | ICD-10-CM | POA: Diagnosis present

## 2023-04-10 DIAGNOSIS — I63422 Cerebral infarction due to embolism of left anterior cerebral artery: Secondary | ICD-10-CM | POA: Diagnosis present

## 2023-04-10 DIAGNOSIS — E663 Overweight: Secondary | ICD-10-CM | POA: Diagnosis present

## 2023-04-10 DIAGNOSIS — Z9841 Cataract extraction status, right eye: Secondary | ICD-10-CM | POA: Diagnosis not present

## 2023-04-10 DIAGNOSIS — Z6829 Body mass index (BMI) 29.0-29.9, adult: Secondary | ICD-10-CM | POA: Diagnosis not present

## 2023-04-10 DIAGNOSIS — D696 Thrombocytopenia, unspecified: Secondary | ICD-10-CM | POA: Diagnosis not present

## 2023-04-10 DIAGNOSIS — Z87891 Personal history of nicotine dependence: Secondary | ICD-10-CM | POA: Diagnosis not present

## 2023-04-10 DIAGNOSIS — E785 Hyperlipidemia, unspecified: Secondary | ICD-10-CM | POA: Diagnosis present

## 2023-04-10 DIAGNOSIS — C4490 Unspecified malignant neoplasm of skin, unspecified: Secondary | ICD-10-CM | POA: Diagnosis present

## 2023-04-10 DIAGNOSIS — W19XXXA Unspecified fall, initial encounter: Secondary | ICD-10-CM | POA: Diagnosis present

## 2023-04-10 DIAGNOSIS — Z66 Do not resuscitate: Secondary | ICD-10-CM | POA: Diagnosis present

## 2023-04-10 DIAGNOSIS — Z1152 Encounter for screening for COVID-19: Secondary | ICD-10-CM | POA: Diagnosis not present

## 2023-04-10 DIAGNOSIS — R531 Weakness: Secondary | ICD-10-CM | POA: Diagnosis not present

## 2023-04-10 DIAGNOSIS — Z79899 Other long term (current) drug therapy: Secondary | ICD-10-CM | POA: Diagnosis not present

## 2023-04-10 LAB — GLUCOSE, CAPILLARY: Glucose-Capillary: 111 mg/dL — ABNORMAL HIGH (ref 70–99)

## 2023-04-10 MED ORDER — SODIUM CHLORIDE 0.9 % IV SOLN: INTRAVENOUS | Status: DC

## 2023-04-10 MED ORDER — TRAZODONE HCL 50 MG PO TABS
50.0000 mg | ORAL_TABLET | Freq: Every day | ORAL | Status: DC
  Administered 2023-04-10 – 2023-04-13 (×3): 50 mg via ORAL
  Filled 2023-04-10 (×3): qty 1

## 2023-04-10 MED ORDER — ROPINIROLE HCL 0.25 MG PO TABS
0.5000 mg | ORAL_TABLET | Freq: Every day | ORAL | Status: DC
  Administered 2023-04-10 – 2023-04-14 (×6): 0.5 mg via ORAL
  Filled 2023-04-10 (×6): qty 2

## 2023-04-10 NOTE — TOC Progression Note (Signed)
Transition of Care [facility]) - Progression Note    Patient Details  Name: Ryan Pace MRN: [id number] Date of Birth: 03-19-1929  [facility] CM/SW Contact  Allena Katz, LCSW Phone Number: 04/10/2023, 2:55 PM  Clinical Narrative:   Pt admitted from ILF at [facility] [place] with cellulitis. No toc needs at this time. Will continue to follow.          Expected Discharge Plan and Services                                               Social Determinants of Health (SDOH) Interventions SDOH Screenings   Food Insecurity: Patient Declined (04/09/2023)  Housing: Patient Declined (04/09/2023)  Tobacco Use: Medium Risk (04/09/2023)    Readmission Risk Interventions     No data to display

## 2023-04-10 NOTE — Evaluation (Signed)
Occupational Therapy Evaluation Patient Details Name: Ryan Pace MRN: [id number] DOB: 28-Oct-1928 Today's Date: 04/10/2023   History of Present Illness Pt is a 89 year old male presenting to the ED with weakness and orthostatic hypotension;    PMH significant for trial fibrillation on Eliquis, chronic kidney disease stage IIIb, hypertension, hyperlipidemia, nephrolithiasis, anemia,   Clinical Impression   Chart reviewed, nurse cleared pt for participation in OT evaluation. Pt is alert and oriented x4, endorses increased difficulty expressing thoughts with increased time required for processing. PTA pt lives at the [facility] [place] with his wife, moved approx 4 months ago. Pt is generally MOD I In ADL, has assist for IADL , amb with a rollator. Pt presents with deficits in strength, endurance, activity tolerance affecting safe and optimal ADL completion. Pt will benefit from ongoing OT to address deficits and to facilitate return to PLOF. OT will continue to follow acutely.   Of note:  BP sitting In chair: 123/76 (MAP 90) HR 94 standing immediately 94/66 (MAP 77) HR 112, could not get to one minute, knees buckled and had to sit down, after legs were up BP 144/89 (MAP 100) HR 97. Spo2 94-95% on RA throughout.        If plan is discharge home, recommend the following: A little help with walking and/or transfers;A little help with bathing/dressing/bathroom    Functional Status Assessment  Patient has had a recent decline in their functional status and demonstrates the ability to make significant improvements in function in a reasonable and predictable amount of time.  Equipment Recommendations  BSC/3in1    Recommendations for Other Services       Precautions / Restrictions Precautions Precautions: Fall Precaution Comments: watch bp Restrictions Weight Bearing Restrictions: No      Mobility Bed Mobility Overal bed mobility: Needs Assistance             General bed  mobility comments: NT in recliner pre/post session    Transfers Overall transfer level: Needs assistance Equipment used: Rolling walker (2 wheels) Transfers: Sit to/from Stand Sit to Stand: Mod assist           General transfer comment: Pt unable to take steps forward, knees buckling, reporting weakness and needed to sit down. ?orthostatic      Balance Overall balance assessment: Needs assistance Sitting-balance support: Feet supported Sitting balance-Leahy Scale: Fair     Standing balance support: Bilateral upper extremity supported, Reliant on assistive device for balance Standing balance-Leahy Scale: Poor                             ADL either performed or assessed with clinical judgement   ADL Overall ADL's : Needs assistance/impaired Eating/Feeding: Sitting;Set up   Grooming: Oral care;Sitting;Set up               Lower Body Dressing: Maximal assistance Lower Body Dressing Details (indicate cue type and reason): socks                     Vision Patient Visual Report: No change from baseline              Pertinent Vitals/Pain Pain Assessment Pain Assessment: No/denies pain     Extremity/Trunk Assessment Upper Extremity Assessment Upper Extremity Assessment: Generalized weakness (grossly 3+/5 in shoulders, elbows)   Lower Extremity Assessment Lower Extremity Assessment: Generalized weakness       Communication Communication Communication: Difficulty communicating  thoughts/reduced clarity of speech;Difficulty following commands/understanding Following commands: Follows one step commands with increased time Cueing Techniques: Verbal cues;Tactile cues;Visual cues   Cognition Arousal: Alert Behavior During Therapy: WFL for tasks assessed/performed Overall Cognitive Status: Impaired/Different from baseline Area of Impairment: Problem solving                             Problem Solving: Slow processing        General Comments  edema noted through BLE    Exercises Other Exercises Other Exercises: edu pt and son re: role of OT, role of rehab, safe ADL completion, vitals monitoring for activity        Home Living Family/patient expects to be discharged to:: Other (Comment) (ILF- [facility]) Living Arrangements: Spouse/significant other Available Help at Discharge: Family Type of Home: Apartment Home Access: Level entry     Home Layout: One level     Bathroom Shower/Tub: Producer, television/film/video: Standard     Home Equipment: Grab bars - toilet;Grab bars - tub/shower;Rollator (4 wheels);Cane - single point;Shower seat          Prior Functioning/Environment Prior Level of Function : Independent/Modified Independent;History of Falls (last six months)             Mobility Comments: amb with rollator ADLs Comments: generally MOD I with ADLs; eats at cafe,cleaner 2x a month        OT Problem List: Decreased strength;Decreased activity tolerance;Decreased knowledge of use of DME or AE;Impaired balance (sitting and/or standing);Decreased knowledge of precautions      OT Treatment/Interventions: Self-care/ADL training;Energy conservation;Balance training;Therapeutic exercise;DME and/or AE instruction;Therapeutic activities;Patient/family education    OT Goals(Current goals can be found in the care plan section) Acute Rehab OT Goals Patient Stated Goal: be safe getting around OT Goal Formulation: With patient Time For Goal Achievement: 04/24/23 Potential to Achieve Goals: Good ADL Goals Pt Will Perform Grooming: with modified independence;sitting Pt Will Perform Lower Body Dressing: with modified independence;sitting/lateral leans Pt Will Transfer to Toilet: with modified independence Pt Will Perform Toileting - Clothing Manipulation and hygiene: with modified independence;sit to/from stand  OT Frequency: Min 1X/week       AM-PAC OT "6 Clicks" Daily  Activity     Outcome Measure Help from another person eating meals?: None Help from another person taking care of personal grooming?: None Help from another person toileting, which includes using toliet, bedpan, or urinal?: A Lot Help from another person bathing (including washing, rinsing, drying)?: A Lot Help from another person to put on and taking off regular upper body clothing?: A Little Help from another person to put on and taking off regular lower body clothing?: A Lot 6 Click Score: 17   End of Session Equipment Utilized During Treatment: Rolling walker (2 wheels) Nurse Communication: Mobility status;Other (comment) (vitals)  Activity Tolerance: Treatment limited secondary to medical complications (Comment) (?orthostatics) Patient left: in chair;with call bell/phone within reach;with chair alarm set;with family/visitor present  OT Visit Diagnosis: Unsteadiness on feet (R26.81);Muscle weakness (generalized) (M62.81)                Time: 1610-9604 OT Time Calculation (min): 31 min Charges:  OT General Charges $OT Visit: 1 Visit OT Evaluation $OT Eval Moderate Complexity: 1 Mod  Oleta Mouse, OTD OTR/L  04/10/23, 10:34 AM

## 2023-04-10 NOTE — Evaluation (Signed)
Physical Therapy Evaluation Patient Details Name: Ryan Pace MRN: [id number] DOB: May 16, 1929 Today's Date: 04/10/2023  History of Present Illness  Pt is a 89 y.o. male presenting to hospital 04/09/23 with c/o fall (back into his chair) and generalized weakness (acute onset).  Pt admitted with generalized weakness, orthostatic hypotension, cellulitis, B LE edema.  PMH includes HLD, anemia, a-fib, kidney stones, a-fib, L TKA, B dupuytren contracture release.  Clinical Impression  Prior to hospital admission, pt was modified independent ambulating with rollator; receiving PT/OT at [facility] (OP); lives with his wife in independent living at [place].  No c/o pain during session.  Currently pt is min assist with transfers and CGA to min assist to ambulate 45 feet with RW use (limited distance d/t weakness and pt becoming more unsteady; chair follow utilized for safety).  Pt's BP sitting was 140/100 with HR 92 bpm; pt's BP in standing was 121/90 with HR 96 bpm; pt's BP in standing at 3 minutes was 140/113 with HR 100 bpm; and pt's BP post ambulation was 155/110 with HR 102 bpm.  Pt would currently benefit from skilled PT to address noted impairments and functional limitations (see below for any additional details).  Upon hospital discharge, pt would benefit from ongoing therapy.     If plan is discharge home, recommend the following: A little help with walking and/or transfers;A little help with bathing/dressing/bathroom;Assistance with cooking/housework;Assist for transportation;Help with stairs or ramp for entrance   Can travel by private vehicle        Equipment Recommendations Rolling walker (2 wheels);BSC/3in1  Recommendations for Other Services       Functional Status Assessment Patient has had a recent decline in their functional status and demonstrates the ability to make significant improvements in function in a reasonable and predictable amount of time.     Precautions /  Restrictions Precautions Precautions: Fall Precaution Comments: monitor BP Restrictions Weight Bearing Restrictions: No      Mobility  Bed Mobility               General bed mobility comments: Deferred (pt in recliner beginning/end of session)    Transfers Overall transfer level: Needs assistance Equipment used: Rolling walker (2 wheels) Transfers: Sit to/from Stand Sit to Stand: Min assist           General transfer comment: x2 trials standing from recliner; vc's for UE placement; assist to initiate stand and control descent sitting    Ambulation/Gait Ambulation/Gait assistance: Contact guard assist, Min assist Gait Distance (Feet): 45 Feet Assistive device: Rolling walker (2 wheels)   Gait velocity: decreased     General Gait Details: decreased B LE step length/foot clearance/heelstrike; pt unsteady with increased distance ambulating; limited d/t B LE weakness  Stairs            Wheelchair Mobility     Tilt Bed    Modified Rankin (Stroke Patients Only)       Balance Overall balance assessment: Needs assistance Sitting-balance support: No upper extremity supported, Feet supported Sitting balance-Leahy Scale: Good Sitting balance - Comments: steady reaching within BOS   Standing balance support: Bilateral upper extremity supported, Reliant on assistive device for balance, During functional activity Standing balance-Leahy Scale: Poor Standing balance comment: assist to steady with ambulation                             Pertinent Vitals/Pain Pain Assessment Pain Assessment: No/denies pain    Home  Living Family/patient expects to be discharged to:: Other (Comment) (Independent Living at [facility]) Living Arrangements: Spouse/significant other Available Help at Discharge: Family Type of Home: Apartment Home Access: Level entry       Home Layout: One level Home Equipment: Grab bars - toilet;Grab bars - tub/shower;Rollator (4  wheels);Cane - single point;Shower seat      Prior Function Prior Level of Function : Independent/Modified Independent;History of Falls (last six months)             Mobility Comments: Modified independent ambulating with rollator.       Extremity/Trunk Assessment   Upper Extremity Assessment Upper Extremity Assessment: Generalized weakness;Defer to OT evaluation    Lower Extremity Assessment Lower Extremity Assessment: Generalized weakness       Communication   Communication Communication: Difficulty following commands/understanding Following commands: Follows one step commands with increased time Cueing Techniques: Verbal cues;Visual cues  Cognition Arousal: Alert Behavior During Therapy: WFL for tasks assessed/performed Overall Cognitive Status: Impaired/Different from baseline Area of Impairment: Problem solving                             Problem Solving: Slow processing          General Comments General comments (skin integrity, edema, etc.): B LE edema noted.  Nursing cleared pt for participation in physical therapy.  Pt agreeable to PT session.    Exercises General Exercises - Lower Extremity Long Arc Quad: AROM, Strengthening, Both, 10 reps, Seated Hip Flexion/Marching: AROM, Strengthening, Both, 10 reps, Seated   Assessment/Plan    PT Assessment Patient needs continued PT services  PT Problem List Decreased strength;Decreased activity tolerance;Decreased balance;Decreased mobility;Decreased cognition;Decreased knowledge of use of DME;Decreased knowledge of precautions       PT Treatment Interventions DME instruction;Gait training;Functional mobility training;Therapeutic activities;Therapeutic exercise;Balance training;Patient/family education    PT Goals (Current goals can be found in the Care Plan section)  Acute Rehab PT Goals Patient Stated Goal: to improve strength and mobility PT Goal Formulation: With patient Time For Goal  Achievement: 04/24/23 Potential to Achieve Goals: Good    Frequency Min 1X/week     Co-evaluation               AM-PAC PT "6 Clicks" Mobility  Outcome Measure Help needed turning from your back to your side while in a flat bed without using bedrails?: None Help needed moving from lying on your back to sitting on the side of a flat bed without using bedrails?: A Little Help needed moving to and from a bed to a chair (including a wheelchair)?: A Little Help needed standing up from a chair using your arms (e.g., wheelchair or bedside chair)?: A Little Help needed to walk in hospital room?: A Little Help needed climbing 3-5 steps with a railing? : A Lot 6 Click Score: 18    End of Session Equipment Utilized During Treatment: Gait belt Activity Tolerance: Patient limited by fatigue Patient left: in chair;with call bell/phone within reach;with chair alarm set Nurse Communication: Mobility status;Precautions PT Visit Diagnosis: Unsteadiness on feet (R26.81);Other abnormalities of gait and mobility (R26.89);Muscle weakness (generalized) (M62.81)    Time: 2595-6387 PT Time Calculation (min) (ACUTE ONLY): 28 min   Charges:   PT Evaluation $PT Eval Low Complexity: 1 Low PT Treatments $Therapeutic Activity: 8-22 mins PT General Charges $$ ACUTE PT VISIT: 1 Visit        Hendricks Limes, PT 04/10/23, 6:05 PM

## 2023-04-10 NOTE — Plan of Care (Signed)

## 2023-04-10 NOTE — Progress Notes (Signed)
Progress Note   Patient: Ryan Pace UXL:[id number] DOB: 1929-08-03 DOA: 04/09/2023     0 DOS: the patient was seen and examined on 04/10/2023   Brief hospital course: 89 y.o. male with medical history significant of atrial fibrillation on Eliquis, chronic kidney disease stage IIIb, hypertension, hyperlipidemia, nephrolithiasis, anemia, who presents to the ED due to weakness.   Ryan Pace states that beginning today, he noticed that when he sat on the recliner and went to go stand up, he was unable to do so.  He has particular weakness in his bilateral lower extremities, but feels that his bilateral upper extremities also feel weaker than usual.  He notes difficult workout session yesterday with physical therapy and believes he did not hydrate very well over the last 24 hours.  Otherwise he denies any acute symptoms, including dizziness, headache, chest pain, shortness of breath, palpitations, dysuria, nausea, vomiting, abdominal pain, or localized weakness.   ED course: On arrival to the ED, patient was hypertensive at 162/89 with heart rate of 75.  He was saturating at 95% on room air.  He was afebrile at 98.2.  Workup demonstrates platelets 135, glucose 102, BUN 30, creatinine 1.54 with GFR 42.  Urinalysis with no leukocytes, nitrites.  Troponin at 22 within patient's baseline.  Due to concern for cellulitis, Rocephin was started.  Due to generalized weakness with positive orthostatics, [facility] contacted for admission.  8/14.  Patient with orthostatic hypotension.  Will order TED hose and gentle IV fluids.  Holding all blood pressure medications except for Cardizem CD.  Would rather have blood pressure on the higher side than to low.  Assessment and Plan: * Orthostatic hypotension Gentle IV fluid.  TED hose.  Holding all antihypertensives except for Calan SR.  Recently increased on lisinopril dose.  Generalized weakness Likely secondary to orthostatic hypotension.  Continue PT and  OT.  Cellulitis Right lower extremity.  Patient placed on Rocephin by admitting physician.  Bilateral lower extremity edema TED hose.  Holding Lasix.  Chronic kidney disease, stage 3b (HCC) Creatinine improved from 1.54 down to 1.37.  Paroxysmal A-fib (HCC) - Continue home verapamil and Eliquis  Essential hypertension Continue Calan and hold lisinopril.        Subjective: Patient feeling very weak.  Had a fall prior to coming in.  Was unable to do too much with Occupational Therapy today.  Found to be orthostatic today.  Physical Exam: Vitals:   04/10/23 0457 04/10/23 0516 04/10/23 0818 04/10/23 1133  BP: (!) 168/97 (!) 150/96 (!) 157/100 (!) 144/91  Pulse: 90  74 80  Resp: 18  16 20   Temp: 97.6 F (36.4 C)  (!) 97.3 F (36.3 C) 98 F (36.7 C)  TempSrc:   Oral   SpO2: 94%  97% 95%  Weight:      Height:       Physical Exam Cardiovascular:     Rate and Rhythm: Normal rate and regular rhythm.     Heart sounds: Normal heart sounds, S1 normal and S2 normal.  Pulmonary:     Breath sounds: No decreased breath sounds, wheezing, rhonchi or rales.  Abdominal:     Palpations: Abdomen is soft.     Tenderness: There is no abdominal tenderness.  Musculoskeletal:     Right lower leg: Swelling present.     Left lower leg: Swelling present.  Skin:    General: Skin is warm.     Findings: No rash.  Neurological:     Mental Status:  He is alert and oriented to person, place, and time.     Data Reviewed: Creatinine 1.37 with a BUN of 28, GFR 49, platelet count 135, hemoglobin 13.1, white blood cell count 5.9  Family Communication: Spoke with son at the bedside  Disposition: Status is: Inpatient Remains inpatient appropriate because: Patient orthostatic today.  Continue working with PT and OT.  Will try to give TED hose and hold all blood pressure medications except for Calan  Planned Discharge Destination: Home    Time spent: 28 minutes  Author: Alford Highland,  MD 04/10/2023 2:26 PM  For on call review [email or web address].

## 2023-04-10 NOTE — Hospital Course (Addendum)
89 y.o. male with medical history significant of atrial fibrillation on Eliquis, chronic kidney disease stage IIIb, hypertension, hyperlipidemia, nephrolithiasis, anemia, who presents to the ED due to weakness.   Ryan Pace states that beginning today, he noticed that when he sat on the recliner and went to go stand up, he was unable to do so.  He has particular weakness in his bilateral lower extremities, but feels that his bilateral upper extremities also feel weaker than usual.  He notes difficult workout session yesterday with physical therapy and believes he did not hydrate very well over the last 24 hours.  Otherwise he denies any acute symptoms, including dizziness, headache, chest pain, shortness of breath, palpitations, dysuria, nausea, vomiting, abdominal pain, or localized weakness.   ED course: On arrival to the ED, patient was hypertensive at 162/89 with heart rate of 75.  He was saturating at 95% on room air.  He was afebrile at 98.2.  Workup demonstrates platelets 135, glucose 102, BUN 30, creatinine 1.54 with GFR 42.  Urinalysis with no leukocytes, nitrites.  Troponin at 22 within patient's baseline.  Due to concern for cellulitis, Rocephin was started.  Due to generalized weakness with positive orthostatics, [facility] contacted for admission.  8/14.  Patient with orthostatic hypotension.  Will order TED hose and gentle IV fluids.  Holding all blood pressure medications except for Cardizem CD.  Would rather have blood pressure on the higher side than to low. 8/15.  Overnight code stroke was called.  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  Patient was started on aspirin and Eliquis held.  Neurology consultation. 8/16.  Patient doing better with physical therapy.  Intermittent trouble with his words.  Strength has improved.  Neurology gave the clearance to go back on Eliquis.  He is a candidate for 5 mg twice a  day. 8/17.  Patient having more difficulty with articulation.  Noticed it more this morning.  Stat CT scan of the head negative for hemorrhage.  No visible progression of patient's acute infarct. 8/18.  Patient still having issues with articulation. 8/19.  Stable for discharge out to rehab.

## 2023-04-11 ENCOUNTER — Inpatient Hospital Stay: Payer: Medicare HMO

## 2023-04-11 ENCOUNTER — Inpatient Hospital Stay (HOSPITAL_COMMUNITY)
Admit: 2023-04-11 | Discharge: 2023-04-11 | Disposition: A | Discharge status: Home / Self Care | Payer: Medicare HMO | Attending: Acute Care | Admitting: Acute Care

## 2023-04-11 DIAGNOSIS — E663 Overweight: Secondary | ICD-10-CM

## 2023-04-11 DIAGNOSIS — I63422 Cerebral infarction due to embolism of left anterior cerebral artery: Secondary | ICD-10-CM

## 2023-04-11 DIAGNOSIS — L03115 Cellulitis of right lower limb: Secondary | ICD-10-CM | POA: Diagnosis not present

## 2023-04-11 DIAGNOSIS — N189 Chronic kidney disease, unspecified: Secondary | ICD-10-CM

## 2023-04-11 DIAGNOSIS — I951 Orthostatic hypotension: Secondary | ICD-10-CM | POA: Diagnosis not present

## 2023-04-11 DIAGNOSIS — N179 Acute kidney failure, unspecified: Secondary | ICD-10-CM

## 2023-04-11 DIAGNOSIS — L989 Disorder of the skin and subcutaneous tissue, unspecified: Secondary | ICD-10-CM

## 2023-04-11 DIAGNOSIS — R531 Weakness: Secondary | ICD-10-CM | POA: Diagnosis not present

## 2023-04-11 LAB — PHOSPHORUS: Phosphorus: 2.7 mg/dL (ref 2.5–4.6)

## 2023-04-11 LAB — CBC WITH DIFFERENTIAL/PLATELET
Abs Immature Granulocytes: 0.04 10*3/uL (ref 0.00–0.07)
Basophils Absolute: 0 10*3/uL (ref 0.0–0.1)
Basophils Relative: 0 %
Eosinophils Absolute: 0.1 10*3/uL (ref 0.0–0.5)
Eosinophils Relative: 1 %
HCT: 42.1 % (ref 39.0–52.0)
Hemoglobin: 13.9 g/dL (ref 13.0–17.0)
Immature Granulocytes: 1 %
Lymphocytes Relative: 18 %
Lymphs Abs: 1.4 10*3/uL (ref 0.7–4.0)
MCH: 30 pg (ref 26.0–34.0)
MCHC: 33 g/dL (ref 30.0–36.0)
MCV: 90.7 fL (ref 80.0–100.0)
Monocytes Absolute: 0.6 10*3/uL (ref 0.1–1.0)
Monocytes Relative: 8 %
Neutro Abs: 5.8 10*3/uL (ref 1.7–7.7)
Neutrophils Relative %: 72 %
Platelets: 138 10*3/uL — ABNORMAL LOW (ref 150–400)
RBC: 4.64 MIL/uL (ref 4.22–5.81)
RDW: 13.7 % (ref 11.5–15.5)
WBC: 8 10*3/uL (ref 4.0–10.5)
nRBC: 0 % (ref 0.0–0.2)

## 2023-04-11 LAB — ECHOCARDIOGRAM COMPLETE BUBBLE STUDY
AR max vel: 2.78 cm2
AV Area VTI: 3.38 cm2
AV Area mean vel: 2.75 cm2
AV Mean grad: 3.5 mmHg
AV Peak grad: 5.2 mmHg
Ao pk vel: 1.14 m/s
Area-P 1/2: 4.33 cm2
MV VTI: 3.34 cm2
S' Lateral: 2.6 cm

## 2023-04-11 LAB — LIPID PANEL
Cholesterol: 194 mg/dL (ref 0–200)
HDL: 57 mg/dL (ref >40)
LDL Cholesterol: 122 mg/dL — ABNORMAL HIGH (ref 0–99)
Total CHOL/HDL Ratio: 3.4 RATIO
Triglycerides: 75 mg/dL (ref <150)
VLDL: 15 mg/dL (ref 0–40)

## 2023-04-11 LAB — BASIC METABOLIC PANEL
Anion gap: 8 (ref 5–15)
BUN: 23 mg/dL (ref 8–23)
CO2: 25 mmol/L (ref 22–32)
Calcium: 8.6 mg/dL — ABNORMAL LOW (ref 8.9–10.3)
Chloride: 103 mmol/L (ref 98–111)
Creatinine, Ser: 1.18 mg/dL (ref 0.61–1.24)
GFR, Estimated: 58 mL/min — ABNORMAL LOW (ref >60)
Glucose, Bld: 100 mg/dL — ABNORMAL HIGH (ref 70–99)
Potassium: 3.8 mmol/L (ref 3.5–5.1)
Sodium: 136 mmol/L (ref 135–145)

## 2023-04-11 LAB — GLUCOSE, CAPILLARY: Glucose-Capillary: 108 mg/dL — ABNORMAL HIGH (ref 70–99)

## 2023-04-11 LAB — MAGNESIUM: Magnesium: 1.9 mg/dL (ref 1.7–2.4)

## 2023-04-11 MED ORDER — ASPIRIN 81 MG PO TBEC
81.0000 mg | DELAYED_RELEASE_TABLET | Freq: Every day | ORAL | Status: DC
  Administered 2023-04-11 – 2023-04-12 (×2): 81 mg via ORAL
  Filled 2023-04-11 (×2): qty 1

## 2023-04-11 MED ORDER — ATORVASTATIN CALCIUM 20 MG PO TABS
40.0000 mg | ORAL_TABLET | Freq: Every day | ORAL | Status: DC
  Administered 2023-04-11 – 2023-04-15 (×5): 40 mg via ORAL
  Filled 2023-04-11 (×6): qty 2

## 2023-04-11 MED ORDER — METHOCARBAMOL 500 MG PO TABS
500.0000 mg | ORAL_TABLET | Freq: Once | ORAL | Status: AC
  Administered 2023-04-11: 500 mg via ORAL
  Filled 2023-04-11: qty 1

## 2023-04-11 MED ORDER — ACETAMINOPHEN 500 MG PO TABS
1000.0000 mg | ORAL_TABLET | Freq: Once | ORAL | Status: AC
  Administered 2023-04-11: 1000 mg via ORAL
  Filled 2023-04-11 (×2): qty 2

## 2023-04-11 MED ORDER — STROKE: EARLY STAGES OF RECOVERY BOOK: Freq: Once | Status: AC

## 2023-04-11 MED ORDER — ACETAMINOPHEN 500 MG PO TABS
1000.0000 mg | ORAL_TABLET | Freq: Once | ORAL | Status: AC
  Administered 2023-04-11: 1000 mg via ORAL
  Filled 2023-04-11: qty 2

## 2023-04-11 NOTE — TOC Progression Note (Signed)
Transition of Care [facility]) - Progression Note    Patient Details  Name: Ryan Pace MRN: [id number] Date of Birth: [birth year]  Transition of [facility] CM/SW Contact  Allena Katz, LCSW Phone Number: 04/11/2023, 10:38 AM  Clinical Narrative:   CSW went to speak with pt about SNF but pt out of the room. CSW will follow back up.         Expected Discharge Plan and Services                                               Social Determinants of Health (SDOH) Interventions SDOH Screenings   Food Insecurity: Patient Declined (04/09/2023)  Housing: Patient Declined (04/09/2023)  Tobacco Use: Medium Risk (04/09/2023)    Readmission Risk Interventions     No data to display

## 2023-04-11 NOTE — Progress Notes (Signed)
SLP Cancellation Note  Patient Details Name: Ryan Pace MRN: [id number] DOB: [birth year]   Cancelled treatment:       Reason Eval/Treat Not Completed: Patient at procedure or test/unavailable. Pt OTF for [place]. Will continue efforts as appropriate.  [name], M.S., CCC-SLP Speech-Language Pathologist [facility] [phone] [name])  [name] 04/11/2023, 10:47 AM

## 2023-04-11 NOTE — Progress Notes (Signed)
Physical Therapy Treatment Patient Details Name: Ryan Pace MRN: [id number] DOB: 1929-08-21 Today's Date: 04/11/2023   History of Present Illness Pt is a 89 y.o. male presenting to hospital 04/09/23 with c/o fall (back into his chair) and generalized weakness (acute onset).  Pt admitted with generalized weakness, orthostatic hypotension, cellulitis, B LE edema.  PMH includes HLD, anemia, a-fib, kidney stones, a-fib, L TKA, B dupuytren contracture release.    PT Comments  Patient received in recliner. He is very pleasant and agrees to PT session. Patient reports no dizziness or significant weakness this session. He is cga for sit to stand and cga for ambulation of 150 feet with Rw. Patient had no lob. He states he feels better today and hopes to continue improving so he can return home. Patient will continue to benefit from skilled PT to improve strength and endurance.       If plan is discharge home, recommend the following: A little help with walking and/or transfers;A little help with bathing/dressing/bathroom;Assistance with cooking/housework;Help with stairs or ramp for entrance;Assist for transportation   Can travel by private vehicle      yes  Equipment Recommendations  Rolling walker (2 wheels);BSC/3in1    Recommendations for Other Services       Precautions / Restrictions Precautions Precautions: Fall Restrictions Weight Bearing Restrictions: No     Mobility  Bed Mobility               General bed mobility comments: Deferred (pt in recliner beginning/end of session)    Transfers Overall transfer level: Needs assistance Equipment used: Rolling walker (2 wheels) Transfers: Sit to/from Stand Sit to Stand: Contact guard assist                Ambulation/Gait Ambulation/Gait assistance: Contact guard assist Gait Distance (Feet): 150 Feet Assistive device: Rolling walker (2 wheels) Gait Pattern/deviations: Step-through pattern Gait velocity: decreased      General Gait Details: patient ambulating with cga. No lob, no significant fatigue noted today. Reports he is feeling better today than yesterday.   Stairs             Wheelchair Mobility     Tilt Bed    Modified Rankin (Stroke Patients Only)       Balance Overall balance assessment: Needs assistance Sitting-balance support: Feet supported Sitting balance-Leahy Scale: Good     Standing balance support: Bilateral upper extremity supported, During functional activity, Reliant on assistive device for balance Standing balance-Leahy Scale: Good Standing balance comment: cga with ambulation                            Cognition Arousal: Alert Behavior During Therapy: WFL for tasks assessed/performed Overall Cognitive Status: Within Functional Limits for tasks assessed                                 General Comments: patient very pleasant, motivated to improve and go back home        Exercises      General Comments        Pertinent Vitals/Pain Pain Assessment Pain Assessment: No/denies pain    Home Living                          Prior Function            PT Goals (current goals  can now be found in the care plan section) Acute Rehab PT Goals Patient Stated Goal: to improve strength and mobility PT Goal Formulation: With patient Time For Goal Achievement: 04/24/23 Potential to Achieve Goals: Good Progress towards PT goals: Progressing toward goals    Frequency    Min 1X/week      PT Plan      Co-evaluation              AM-PAC PT "6 Clicks" Mobility   Outcome Measure  Help needed turning from your back to your side while in a flat bed without using bedrails?: A Little Help needed moving from lying on your back to sitting on the side of a flat bed without using bedrails?: A Little Help needed moving to and from a bed to a chair (including a wheelchair)?: A Little Help needed standing up from a  chair using your arms (e.g., wheelchair or bedside chair)?: A Little Help needed to walk in hospital room?: A Little Help needed climbing 3-5 steps with a railing? : A Lot 6 Click Score: 17    End of Session Equipment Utilized During Treatment: Gait belt Activity Tolerance: Patient tolerated treatment well Patient left: in chair;with call bell/phone within reach;with chair alarm set Nurse Communication: Mobility status PT Visit Diagnosis: Difficulty in walking, not elsewhere classified (R26.2);Muscle weakness (generalized) (M62.81)     Time: 8119-1478 PT Time Calculation (min) (ACUTE ONLY): 10 min  Charges:    $Gait Training: 8-22 mins PT General Charges $$ ACUTE PT VISIT: 1 Visit                      , PT, GCS 04/11/23,3:19 PM

## 2023-04-11 NOTE — Significant Event (Signed)
Rapid Response Event Note   Reason for Call :  Code Stroke  Left sided face droop Left arm weakness  Initial Focused Assessment:  Patient alert and oriented X4. Patient speech is slurred and patient appears to have a left sided face droop. Patient reports left arm heaviness. Patient being Transferred to CT for a head CT on assessment. Teleneurology  and  Hospitalist Manuela Schwartz NP at bedside. Vitals signs BP 168/84 (114) Pulse 99 RR 18 O2 97 on RA  Interventions:  -CT head -MRI of head ordered Plan of Care:  See Neurology's note  Event Summary:   MD Notified: Manuela Schwartz Call [phone] Arrival Time:0000 End [id number]  Judyann Munson, RN

## 2023-04-11 NOTE — Progress Notes (Addendum)
Progress Note   Patient: Ryan Pace ZOX:[id number] DOB: 1929-06-20 DOA: 04/09/2023     1 DOS: the patient was seen and examined on 04/11/2023   Brief hospital course: 89 y.o. male with medical history significant of atrial fibrillation on Eliquis, chronic kidney disease stage IIIb, hypertension, hyperlipidemia, nephrolithiasis, anemia, who presents to the ED due to weakness.   Ryan Pace states that beginning today, he noticed that when he sat on the recliner and went to go stand up, he was unable to do so.  He has particular weakness in his bilateral lower extremities, but feels that his bilateral upper extremities also feel weaker than usual.  He notes difficult workout session yesterday with physical therapy and believes he did not hydrate very well over the last 24 hours.  Otherwise he denies any acute symptoms, including dizziness, headache, chest pain, shortness of breath, palpitations, dysuria, nausea, vomiting, abdominal pain, or localized weakness.   ED course: On arrival to the ED, patient was hypertensive at 162/89 with heart rate of 75.  He was saturating at 95% on room air.  He was afebrile at 98.2.  Workup demonstrates platelets 135, glucose 102, BUN 30, creatinine 1.54 with GFR 42.  Urinalysis with no leukocytes, nitrites.  Troponin at 22 within patient's baseline.  Due to concern for cellulitis, Rocephin was started.  Due to generalized weakness with positive orthostatics, [facility] contacted for admission.  8/14.  Patient with orthostatic hypotension.  Will order TED hose and gentle IV fluids.  Holding all blood pressure medications except for Cardizem CD.  Would rather have blood pressure on the higher side than to low. 8/15.  Overnight code stroke was called.  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  Patient was started on aspirin and Eliquis held.  Neurology consultation.  Assessment and Plan: *  Embolic stroke involving left anterior cerebral artery (HCC) Currently on aspirin.  Eliquis held overnight.  Will get neurology input on when to restart Eliquis.  PT and OT continue treatment.  Orthostatic hypotension Continue to check orthostatics.  TED hose.  Holding all antihypertensives except for Calan SR.    Generalized weakness Likely secondary to acute stroke and orthostatic hypotension.  Continue PT and OT.  Cellulitis Right lower extremity.  Patient placed on Rocephin by admitting physician.  Bilateral lower extremity edema TED hose.   Acute kidney injury superimposed on CKD (HCC) Acute kidney injury on CKD stage IIIb.  Creatinine improved from 1.54 down to 1.18 with IV fluid hydration.  Discontinue fluids today.  Paroxysmal A-fib (HCC) Continue home verapamil.  Neurology to determine when to restart Eliquis.  Essential hypertension Continue Calan and hold lisinopril.  Skin lesion Behind right ear.  Likely skin cancer.  Recommend dermatology follow-up as outpatient.  Overweight (BMI 25.0-29.9) BMI listed as 29.53.        Subjective: Patient complained of some difficulty articulating.  Last night was leaning a little bit to the left.  Patient feels total body weakness no weakness 1 side versus the other.  Physical Exam: Vitals:   04/11/23 0715 04/11/23 0915 04/11/23 1205 04/11/23 1516  BP: (!) 178/99 (!) 147/83 (!) 165/101 (!) 136/93  Pulse: 80 98 85 91  Resp: 16 18 17 17   Temp: 97.7 F (36.5 C) 97.6 F (36.4 C) (!) 97.5 F (36.4 C) 98.6 F (37 C)  TempSrc: Oral Oral Oral Oral  SpO2:  98% 97% 97%  Weight:  Height:       Physical Exam HENT:     Head: Normocephalic.     Mouth/Throat:     Pharynx: No oropharyngeal exudate.  Eyes:     General: Lids are normal.     Conjunctiva/sclera: Conjunctivae normal.  Cardiovascular:     Rate and Rhythm: Normal rate and regular rhythm.     Heart sounds: Normal heart sounds, S1 normal and S2 normal.   Pulmonary:     Breath sounds: No decreased breath sounds, wheezing, rhonchi or rales.  Abdominal:     Palpations: Abdomen is soft.     Tenderness: There is no abdominal tenderness.  Musculoskeletal:     Right lower leg: Swelling present.     Left lower leg: Swelling present.  Skin:    General: Skin is warm.     Findings: No rash.     Comments: Behind right ear 2 areas of growth looks like a skin cancer.  Neurological:     Mental Status: He is alert and oriented to person, place, and time.     Comments: Generalized weakness total body.     Data Reviewed: MRI showing cluster of small acute infarcts in the left corpus callosum body and cingulate white matter, left ACA distribution LDL 122, hemoglobin 13.9, platelet count 138, creatinine 1.18  Family Communication: Updated son on the phone  Disposition: Status is: Inpatient Remains inpatient appropriate because: Diagnosed with acute stroke also has orthostatic hypotension  Planned Discharge Destination: Rehab    Time spent: 30 minutes Case discussed with neurology.  Author: Alford Highland, MD 04/11/2023 3:58 PM  For on call review [email or web address].

## 2023-04-11 NOTE — Consult Note (Addendum)
Neurology Consultation Reason for Consult: Stroke Referring Physician: Renae Gloss, R  CC: Leg weakness  History is obtained from: Patient  HPI: Ryan Pace is a 89 y.o. male who was in his normal state of health until yesterday evening when he noticed that he had lower extremity weakness.  When I ask him if one side was worse than the other he was not certain of this.  He was brought into the emergency department for evaluation and had a head CT which was negative.  Due to his inability to walk he had an MRI which revealed a left distal ACA territory infarct.  He also complains of some difficulty with word finding though I am unable to detect this on exam  Last known well 8/12 tPA given: No, outside window  Past Medical History:  Diagnosis Date   Anemia    Dysrhythmia    atrial fibrillation   Headache    Hyperlipidemia    Kidney stones    Prostatitis      History reviewed. No pertinent family history.   Social History:  reports that he has never smoked. He has quit using smokeless tobacco. He reports that he does not drink alcohol and does not use drugs.   Exam: Current vital signs: BP (!) 136/93 (BP Location: Left Arm)   Pulse 91   Temp 98.6 F (37 C) (Oral)   Resp 17   Ht 5\' 9"  (1.753 m)   Wt 90.7 kg   SpO2 97%   BMI 29.53 kg/m  Vital signs in last 24 hours: Temp:  [97.5 F (36.4 C)-98.6 F (37 C)] 98.6 F (37 C) (08/15 1516) Pulse Rate:  [58-122] 91 (08/15 1516) Resp:  [16-20] 17 (08/15 1516) BP: (136-180)/(70-111) 136/93 (08/15 1516) SpO2:  [95 %-98 %] 97 % (08/15 1516)   Physical Exam  Appears well-developed and well-nourished.   Neuro: Mental Status: Patient is awake, alert, oriented to person, place, month, year, and situation. Patient is able to give a clear and coherent history. No signs of aphasia or neglect Cranial Nerves: II: Visual Fields are full. Pupils are equal, round, and reactive to light.   III,IV, VI: EOMI without ptosis or  diploplia.  V: Facial sensation is symmetric to temperature VII: Facial movement with subtle asymmetry of the face on the right VIII: hearing is intact to voice X: Uvula elevates symmetrically XI: Shoulder shrug is symmetric. XII: tongue is midline without atrophy or fasciculations.  Motor: Tone is normal. Bulk is normal. 5/5 strength was present on the left, on the right he has 4/5 weakness of the right leg and 4+/5 weakness of the right arm.  Sensory: Sensation is symmetric to light touch and temperature in the arms and legs. Cerebellar: FNF and HKS are intact bilaterally      I have reviewed labs in epic and the results pertinent to this consultation are: LDL 122 A1c was 5.8 last month  I have reviewed the images obtained: MRI brain-distal left ACA territory infarcts, left A2 atheromatous change  Impression: 89 year old male with left ACA infarct which is likely secondary to large vessel atherosclerosis.  He will need aggressive risk factor modification, and he needs anticoagulation for his atrial fibrillation.  It is unclear if the addition of aspirin to anticoagulation is beneficial in the setting and therefore I would not favor this, but results from the WASID trial suggested that therapeutic anticoagulation was equivalent to slightly better at preventing ischemic stroke from intracranial stenosis compared to antiplatelet therapy(with added  risk). Since he already has another indication for anticoagulation,  I would favor aspirin until 3 days from his onset of symptoms and then he can resume anticoagulation with Eliquis monotherapy and d/c ASA.  Recommendations: 1) high intensity statin therapy with atorvastatin 40 mg daily 2) aspirin 81 mg daily for 3 days from onset of symptoms, then resume anticoagulation with Eliquis and d/c ASA 3) can begin to gently lower blood pressures and agree with rate control 4) PT, OT, ST 5) neurology will follow   Ritta Slot, MD [facility] [phone]  If 7pm- 7am, please page neurology on call as listed in AMION.

## 2023-04-11 NOTE — Assessment & Plan Note (Addendum)
Acute kidney injury on CKD stage IIIb.  Creatinine improved from 1.54 down to 1.18 with IV fluid hydration. Today's creatinine 1.27.

## 2023-04-11 NOTE — Progress Notes (Signed)
Patient transferred from recliner to bed with CGA using a wheeled walker.  Patient transported to ultrasound in bed with transportation in stable condition.

## 2023-04-11 NOTE — Assessment & Plan Note (Signed)
BMI listed as 29.53.

## 2023-04-11 NOTE — Consult Note (Signed)
[facility] TeleNeurology Consult Services   Patient Name:   Ryan Pace, Ryan "Nadine Counts" Date of Birth:   May 04, 1929 Identification Number:   MRN - [id number] Date of Service:   04/11/2023 00:06:19  Diagnosis:       I63.89 - Cerebrovascular accident (CVA) due to other mechanism [facility])  Impression:      1. Left sided weakness  2. afib on eliquis  - CTH no acute findings, chronic L basal ganglia lacunar stroke  - LNW time unknown, has been generally weak since admission and does not know when the Left sided weakness began.  - presentation concerning for acute ischemic stroke given the sudden change this evening; pure motor symptoms  Our recommendations are outlined below.  Recommendations:        Stroke/Telemetry Floor       Neuro Checks q4h       Bedside Swallow Eval       DVT Prophylaxis       IV Fluids, Normal Saline       Head of Bed 30 Degrees       Euglycemia and Avoid Hyperthermia (PRN Acetaminophen)       Hold Anticoagulation for Now       Initiate or continue Aspirin 81 MG daily       Antihypertensives PRN if Blood pressure is greater than 220/120 or there is a concern for End organ damage/contraindications for permissive HTN. If blood pressure is greater than 220/120 give labetalol PO or IV or Vasotec IV with a goal of 15% reduction in BP during the first 24 hours.       routine MRI brain without contrast       routine MRA head/neck stroke protocol       echocardiogram       lipid panel, a1c       PT/OT consults       Speech consult if failed swallow study  Sign Out:       Discussed with Rapid Response Team    ------------------------------------------------------------------------------  Advanced Imaging: Advanced Imaging Deferred because:  Non-disabling symptoms as verified by the patient; no cortical signs so not consistent with LVO   Metrics: Last Known Well: Unknown [facility] Notification Time: 04/11/2023 00:06:19 Stamp Time:  04/11/2023 00:06:19 Initial Response Time: 04/11/2023 00:11:32 Symptoms: Left weak. Initial patient interaction: 04/11/2023 00:14:06 NIHSS Assessment Completed: 04/11/2023 00:20:00 Patient is not a candidate for Thrombolytic. Thrombolytic Medical Decision: 04/11/2023 00:14:06 Patient was not deemed candidate for Thrombolytic because of following reasons: Last Well Known Above 4.5 Hours. Use of NOAs within 48 hours.  CT head showed no acute hemorrhage or acute core infarct. I personally Reviewed the CT Head and it Showed No Acute Hemorrhage or Acute Core Infarct  ED Physician not notified of diagnostic impression and management plan because no number provided    ------------------------------------------------------------------------------  History of Present Illness: Patient is a 89 year old Male.  Inpatient stroke alert was called for symptoms of Left weak. Ryan Pace is a 89yo male pmh atrial fibrillation on Eliquis, chronic kidney disease stage IIIb, hypertension, hyperlipidemia, nephrolithiasis, anemia, who initially presented to the ED due to weakness, admitted for orthostatic hypotension, generalized weakness, cellulitis of the RLE. complains of slurred speech, Left arm and leg heaviness. He is unsure when the left arm/leg weakness began, unsure if it started today or yesterday. Says the slurred speech started when he slumped to the left around 2348 this evening. Nursing also noticed a new Left facial droop. last dose eliquis  this evening, has been on eliquis many years without issue. Says he was admitted because he suddenly fell while shaving at home. Says his only medical problem is afib.   Past Medical History:      There is no history of Diabetes Mellitus      There is no history of Stroke      There is no history of Seizures  Medications:  Anticoagulant use:  Yes eliquis No Antiplatelet use Reviewed EMR for current medications  Allergies:  Reviewed  Social  History: Smoking: No Alcohol Use: No Drug Use: No  Family History:  There is no family history of premature cerebrovascular disease pertinent to this consultation  ROS : 14 Points Review of Systems was performed and was negative except mentioned in HPI.  Past Surgical History: There Is No Surgical History Contributory To Today's Visit    Examination: BP(168/65), Pulse(122), 1A: Level of Consciousness - Alert; keenly responsive + 0 1B: Ask Month and Age - Both Questions Right + 0 1C: Blink Eyes & Squeeze Hands - Performs Both Tasks + 0 2: Test Horizontal Extraocular Movements - Normal + 0 3: Test Visual Fields - No Visual Loss + 0 4: Test Facial Palsy (Use Grimace if Obtunded) - Minor paralysis (flat nasolabial fold, smile asymmetry) + 1 5A: Test Left Arm Motor Drift - Drift, but doesn't hit bed + 1 5B: Test Right Arm Motor Drift - No Drift for 10 Seconds + 0 6A: Test Left Leg Motor Drift - Drift, but doesn't hit bed + 1 6B: Test Right Leg Motor Drift - No Drift for 5 Seconds + 0 7: Test Limb Ataxia (FNF/Heel-Shin) - No Ataxia + 0 8: Test Sensation - Normal; No sensory loss + 0 9: Test Language/Aphasia - Normal; No aphasia + 0 10: Test Dysarthria - Mild-Moderate Dysarthria: Slurring but can be understood + 1 11: Test Extinction/Inattention - No abnormality + 0  NIHSS Score: 4  NIHSS Free Text : L NLF  Pre-Morbid Modified Rankin Scale: 3 Points = Moderate disability; requiring some help, but able to walk without assistance   This consult was conducted in real time using interactive audio and Immunologist. Patient was informed of the technology being used for this visit and agreed to proceed. Patient located in hospital and provider located at home/office setting.   Patient is being evaluated for possible acute neurologic impairment and high probability of imminent or life-threatening deterioration. I spent total of 30 minutes providing care to this patient, including  time for face to face visit via telemedicine, review of medical records, imaging studies and discussion of findings with providers, the patient and/or family.   Dr Marijo File   [facility] For Inpatient follow-up with [facility] physician please call [facility] [phone]. This is not an outpatient service. Post hospital discharge, please contact hospital directly.  Please do not communicate with [facility] physicians via secure chat. If you have any questions, Please contact [facility]. Please call or reconsult our service if there are any clinical or diagnostic changes.

## 2023-04-11 NOTE — Plan of Care (Signed)

## 2023-04-11 NOTE — Assessment & Plan Note (Addendum)
Eliquis was restarted 8/16 evening at 5 mg twice a day.  Lipitor 40 mg daily.  Stat CT scan of the head done on 8/17 showed a stable exam without hemorrhage.  Patient will go out to rehab on Monday.  Patient still having some difficulty with articulation.

## 2023-04-11 NOTE — Plan of Care (Signed)
  Problem: Education: Goal: Knowledge of General Education information will improve Description Including pain rating scale, medication(s)/side effects and non-pharmacologic comfort measures Outcome: Progressing   Problem: Health Behavior/Discharge Planning: Goal: Ability to manage health-related needs will improve Outcome: Progressing   

## 2023-04-11 NOTE — Progress Notes (Signed)
Code Stroke Timeline LKW 2143 ROS 2340 MD at bedside 2358 All times above per primary RN MRS 3  0000 Code stroke cart activation, pt in CT 0006 Neuro TSMD paged [phone] Neuro [facility] on camera Ryan Pace)  0028 Neuro TSMD and TSRN off camera.

## 2023-04-11 NOTE — Progress Notes (Addendum)
       CROSS COVER NOTE  NAME: Ryan Pace MRN: [id number] DOB : [birth year]    Concern as stated by nurse / staff   Code stroke called      Pertinent findings on chart review: Admitted with generalized weakness and concern for a cellulitis from venous stasis.  He was started on rocephin for this, however, he has no fever, no leukocytosis and procal is <0.10.  TSH and vitamin B12 levls done in last year were normal. History afib on eliquis. HTN, HLD, CKD 3, nephrolithiasis and from labs appears to have ongoing mild thrombocytopenia.  Code stroke called due to nurse noting left sided weakness and increased in slurred speech (baseline has a stutter)  Assessment and  Interventions   Assessment:    04/11/2023    3:22 AM 04/11/2023    1:15 AM 04/11/2023   12:57 AM  Vitals with BMI  Systolic 139 160   Diastolic 70 98   Pulse 58 85 88    Met nurse with patient in CT. Observed tele neuro exam. Patient weak, oriented x3. MAE however reported left arm and left leg felt heavier. Did note some tremor of hands during exam Initial CT screen negative for acute findings but found evidence of old left sided infarct EKG reviewed by me SR Prior CT neg in 08/2022 without acute fracture but did show  Partial osseous fusion of C2 and C3, C4 and C5, and C7 and T1. Therefore cannot rule out myelopathy as cause of his weakness   Plan:Stroke Hold eliquis, resume if mri/mra is negative Check mag and phos now 1000 tylenol and 500 robaxin po for neck pain Cbc and chem panel in am NIH every 12,  ASA, start statin therapy (lipitor 40 initiated) Cbc, bmp A1c lipid panel in am Permissive hypertension per neuro rec Consider discontinuation of antibiotics        Donnie Mesa NP [facility] Cross [facility] 7pm-7am - check amion for availability Pager [phone]   [phone] - MRI results show multiple small new infarcts in left corpus collosum [facility] neurology consulted  and secure chat  sent to Dr Amada Jupiter  Echo with bubble study ordered Bilateral LE dopplers ordered Donnie Mesa NP [facility] Cross Cover 7pm-7am - check amion for availability Pager [phone]

## 2023-04-11 NOTE — Assessment & Plan Note (Signed)
Behind right ear.  Likely skin cancer.  Recommend dermatology follow-up as outpatient.

## 2023-04-11 NOTE — Progress Notes (Signed)
Patient c/o restlessness and states he had an episode of L arm heaviness and being unable to hold it up. Patient now able to hold Left arm up. Patient sitting on side of bed feet touching the floor. NT obtaining vitals and the patient states I feel like I am falling to the left. RN witnessed the patient leaning to left he was unable to stay sitting straight up. Patient has L sided facial droop, and his slurred speech is increased from baseline.   Code stroke called, Manuela Schwartz, NP notified of symptoms and code stroke.   Patient transported to CT with Rapid RN at bedside.

## 2023-04-11 NOTE — Progress Notes (Signed)
*  PRELIMINARY RESULTS* Echocardiogram 2D Echocardiogram has been performed.  Ryan Pace 04/11/2023, 2:29 PM

## 2023-04-12 DIAGNOSIS — L989 Disorder of the skin and subcutaneous tissue, unspecified: Secondary | ICD-10-CM

## 2023-04-12 DIAGNOSIS — I63422 Cerebral infarction due to embolism of left anterior cerebral artery: Secondary | ICD-10-CM | POA: Diagnosis not present

## 2023-04-12 DIAGNOSIS — I951 Orthostatic hypotension: Secondary | ICD-10-CM | POA: Diagnosis not present

## 2023-04-12 DIAGNOSIS — R531 Weakness: Secondary | ICD-10-CM | POA: Diagnosis not present

## 2023-04-12 DIAGNOSIS — L03115 Cellulitis of right lower limb: Secondary | ICD-10-CM | POA: Diagnosis not present

## 2023-04-12 MED ORDER — CEPHALEXIN 500 MG PO CAPS
500.0000 mg | ORAL_CAPSULE | Freq: Three times a day (TID) | ORAL | Status: AC
  Administered 2023-04-12 – 2023-04-13 (×5): 500 mg via ORAL
  Filled 2023-04-12 (×5): qty 1

## 2023-04-12 MED ORDER — APIXABAN 5 MG PO TABS
5.0000 mg | ORAL_TABLET | Freq: Two times a day (BID) | ORAL | Status: DC
  Administered 2023-04-12 – 2023-04-15 (×6): 5 mg via ORAL
  Filled 2023-04-12 (×6): qty 1

## 2023-04-12 MED ORDER — VERAPAMIL HCL ER 180 MG PO TBCR
360.0000 mg | EXTENDED_RELEASE_TABLET | Freq: Every day | ORAL | Status: DC
  Administered 2023-04-12 – 2023-04-14 (×3): 360 mg via ORAL
  Filled 2023-04-12 (×4): qty 2

## 2023-04-12 NOTE — Progress Notes (Signed)
Occupational Therapy Treatment Patient Details Name: Ryan Pace MRN: [id number] DOB: 1928-09-06 Today's Date: 04/12/2023   History of present illness Pt is a 89 y.o. male presenting to hospital 04/09/23 with c/o fall (back into his chair) and generalized weakness (acute onset).  Pt admitted with generalized weakness, orthostatic hypotension, cellulitis, B LE edema.  PMH includes HLD, anemia, a-fib, kidney stones, a-fib, L TKA, B dupuytren contracture release.   OT comments  Pt seen for OT tx. Son present and supportive throughout. Pt eager to ambulate, requiring CGA for STS with good hand placement on arm rest prior to standing. Pt ambulated around nurses station ~160' with RW and CGA requiring 2 standing rest breaks as encouraged by OT to support recovery, as HR ranged from 120's-143 with exertion. Once back in the room, pt stood at sink for grooming tasks, occasionally using BUE and no overt LOB noted. Pt able to doff sock from seated position but demonstrated difficulty donning. Pt educated in figure four technique which he was able to complete and then donned sock without difficulty. Son notes pt sometimes has difficulty using R hand for utensil use 2/2 hx of Dupuytren's contracture of 5th finger. Pt/son educated in red foam built up hand to improve utensil use without dropping. Pt verbalized understanding reported that he would "try it out." Pt demonstrating good progress towards goals. Pt continues to benefit from skilled OT Services.       If plan is discharge home, recommend the following:  A little help with walking and/or transfers;A little help with bathing/dressing/bathroom;Assist for transportation;Help with stairs or ramp for entrance   Equipment Recommendations  BSC/3in1    Recommendations for Other Services      Precautions / Restrictions Precautions Precautions: Fall Precaution Comments: monitor BP Restrictions Weight Bearing Restrictions: No       Mobility Bed  Mobility               General bed mobility comments: Deferred (pt in recliner beginning/end of session)    Transfers Overall transfer level: Needs assistance Equipment used: Rolling walker (2 wheels) Transfers: Sit to/from Stand Sit to Stand: Contact guard assist                 Balance Overall balance assessment: Needs assistance Sitting-balance support: Feet supported Sitting balance-Leahy Scale: Good     Standing balance support: Bilateral upper extremity supported, During functional activity, Reliant on assistive device for balance, No upper extremity supported, Single extremity supported Standing balance-Leahy Scale: Fair Standing balance comment: in standing at sink pt able to stand without LOB for tasks and PRN UE support on counter top                           ADL either performed or assessed with clinical judgement   ADL Overall ADL's : Needs assistance/impaired     Grooming: Standing;Supervision/safety;Wash/dry face;Oral care;Wash/dry hands;Contact guard assist           Upper Body Dressing : Standing;Contact guard assist Upper Body Dressing Details (indicate cue type and reason): doff, don bathrobe Lower Body Dressing: Moderate assistance;Minimal assistance Lower Body Dressing Details (indicate cue type and reason): socks; educated in use of figure four technqiue to improve safety Toilet Transfer: Grab bars;Supervision/safety;Ambulation;Rolling walker (2 wheels);Regular Toilet   Toileting- Clothing Manipulation and Hygiene: Sitting/lateral lean;Modified independent       Functional mobility during ADLs: Contact guard assist;Rolling walker (2 wheels)      Extremity/Trunk Assessment  Vision       Perception     Praxis      Cognition Arousal: Alert Behavior During Therapy: WFL for tasks assessed/performed Overall Cognitive Status: Within Functional Limits for tasks assessed                                  General Comments: grossly WFL, PRN VC for safety        Exercises Other Exercises Other Exercises: Pt/son educated in activity pacing and pt completed 2 brief standing rest breaks during ~160' walk with RW to support recovery. Pt denied dizziness or difficulty. HR in 120's-143 with exertion. Other Exercises: Son notes pt sometimes has difficulty using R hand for utensil use 2/2 hx of Dupuytren's contracture of 5th finger. Pt/son educated in red foam built up hand to improve utensil use without dropping. Pt verbalized understanding reported that he would "try it out."    Shoulder Instructions       General Comments      Pertinent Vitals/ Pain       Pain Assessment Pain Assessment: No/denies pain  Home Living                                          Prior Functioning/Environment              Frequency  Min 1X/week        Progress Toward Goals  OT Goals(current goals can now be found in the care plan section)  Progress towards OT goals: Progressing toward goals  Acute Rehab OT Goals Patient Stated Goal: be safe getting around OT Goal Formulation: With patient Time For Goal Achievement: 04/24/23 Potential to Achieve Goals: Good  Plan      Co-evaluation                 AM-PAC OT "6 Clicks" Daily Activity     Outcome Measure   Help from another person eating meals?: None Help from another person taking care of personal grooming?: None Help from another person toileting, which includes using toliet, bedpan, or urinal?: A Little Help from another person bathing (including washing, rinsing, drying)?: A Lot Help from another person to put on and taking off regular upper body clothing?: A Little Help from another person to put on and taking off regular lower body clothing?: A Lot 6 Click Score: 18    End of Session Equipment Utilized During Treatment: Rolling walker (2 wheels);Gait belt  OT Visit Diagnosis: Unsteadiness on  feet (R26.81);Muscle weakness (generalized) (M62.81)   Activity Tolerance Patient tolerated treatment well   Patient Left in chair;with call bell/phone within reach;with chair alarm set;with family/visitor present   Nurse Communication Mobility status;Other (comment) (HR)        Time: 6578-4696 OT Time Calculation (min): 28 min  Charges: OT General Charges $OT Visit: 1 Visit OT Treatments $Self Care/Home Management : 23-37 mins  Arman Filter., MPH, MS, OTR/L ascom [phone] 04/12/23, 1:29 PM

## 2023-04-12 NOTE — Care Management Important Message (Signed)
Important Message  Patient Details  Name: Ryan Pace MRN: [id number] Date of Birth: 1929-06-14   Medicare Important Message Given:  Yes     Olegario Messier A Tykerria Mccubbins 04/12/2023, 2:05 PM

## 2023-04-12 NOTE — Progress Notes (Signed)
Subjective: Mild difficulty sleeping, no other issues  Exam: Vitals:   04/12/23 0324 04/12/23 0823  BP: (!) 180/94 (!) 146/98  Pulse: 80 97  Resp: 18 18  Temp: 98.2 F (36.8 C) (!) 97.5 F (36.4 C)  SpO2: 97% 97%   Gen: In bed, NAD Resp: non-labored breathing, no acute distress Abd: soft, nt   Neuro: Mental Status: Patient is awake, alert, oriented to person, place, month, year, and situation. Patient is able to give a clear and coherent history. No signs of aphasia or neglect Cranial Nerves: II: Visual Fields are full. Pupils are equal, round, and reactive to light.   III,IV, VI: EOMI without ptosis or diploplia.  V: Facial sensation is symmetric to temperature VII: Facial movement with subtle asymmetry of the face on the right VIII: hearing is intact to voice X: Uvula elevates symmetrically XI: Shoulder shrug is symmetric. XII: tongue is midline without atrophy or fasciculations.  Motor: Tone is normal. Bulk is normal. 5/5 strength was present on the left, on the right he has 4/5 weakness of the right leg and 4+/5 weakness of the right arm.  Sensory: Sensation is symmetric to light touch and temperature in the arms and legs. Cerebellar: FNF and HKS are intact bilaterally   Pertinent Labs: LDL 122 A1c 5.8   Impression: 89 year old male with left ACA infarct which is likely secondary to large vessel atherosclerosis.  He will need aggressive risk factor modification, and he needs anticoagulation for his atrial fibrillation.  It is unclear if the addition of aspirin to anticoagulation is beneficial in the setting and therefore I would not favor this, but results from the WASID trial suggested that therapeutic anticoagulation was equivalent to slightly better at preventing ischemic stroke from intracranial stenosis compared to antiplatelet therapy(with added risk). Since he already has another indication for anticoagulation,  I would favor aspirin until 3 days from his onset  of symptoms and then he can resume anticoagulation with Eliquis monotherapy and d/c ASA.    Recommendations: 1) high intensity statin therapy with atorvastatin 40 mg daily 2) aspirin 81 mg daily for 3 days from onset of symptoms, then resume anticoagulation with Eliquis and d/c ASA 3) can begin to gently lower blood pressures and agree with rate control 4) PT, OT, ST 5) neurology will continue to be available as needed, please call if further questions or concerns.  Ritta Slot, MD [facility] [phone]  If 7pm- 7am, please page neurology on call as listed in AMION.

## 2023-04-12 NOTE — Evaluation (Signed)
Speech Language Pathology Evaluation Patient Details Name: Ryan Pace MRN: [id number] DOB: 10-Sep-1928 Today's Date: 04/12/2023 Time: 0865-7846 SLP Time Calculation (min) (ACUTE ONLY): 13 min  Problem List:  Patient Active Problem List   Diagnosis Date Noted   Embolic stroke involving left anterior cerebral artery (HCC) 04/11/2023   Acute kidney injury superimposed on CKD (HCC) 04/11/2023   Overweight (BMI 25.0-29.9) 04/11/2023   Skin lesion 04/11/2023   Generalized weakness 04/09/2023   Orthostatic hypotension 04/09/2023   Cellulitis 04/09/2023   Bilateral lower extremity edema 04/09/2023   Obstructive sleep apnea 04/18/2020   S/P TKR (total knee replacement) using cement, left 10/29/2018   Essential hypertension 10/08/2018   Osteoarthritis of left knee 09/19/2018   Hematoma 06/08/2018   Seborrheic dermatitis 05/28/2018   Dupuytren's contracture 12/12/2015   Insomnia with sleep apnea 10/12/2015   Hyperlipidemia, mixed 03/09/2015   Moderate mitral insufficiency 09/06/2014   Paroxysmal A-fib (HCC) 09/06/2014   CAD (coronary artery disease), native coronary artery 02/05/2014   Past Medical History:  Past Medical History:  Diagnosis Date   Anemia    Dysrhythmia    atrial fibrillation   Headache    Hyperlipidemia    Kidney stones    Prostatitis    Past Surgical History:  Past Surgical History:  Procedure Laterality Date   APPENDECTOMY     CARDIAC CATHETERIZATION     CATARACT EXTRACTION, BILATERAL     COLONOSCOPY     1998, 2003, 2011   DUPUYTREN CONTRACTURE RELEASE Bilateral    DUPUYTREN CONTRACTURE RELEASE Right 01/11/2016   Procedure: DUPUYTREN CONTRACTURE RELEASE;  Surgeon: Deeann Saint, MD;  Location: [facility] ORS;  Service: Orthopedics;  Laterality: Right;   FLEXIBLE SIGMOIDOSCOPY     1993   GREEN LIGHT LASER TURP (TRANSURETHRAL RESECTION OF PROSTATE N/A 02/15/2015   Procedure: GREEN LIGHT LASER TURP (TRANSURETHRAL RESECTION OF PROSTATE;  Surgeon: Orson Ape, MD;  Location: [facility] ORS;  Service: Urology;  Laterality: N/A;   TONSILLECTOMY     TOTAL KNEE ARTHROPLASTY Left 10/29/2018   Procedure: TOTAL KNEE ARTHROPLASTY-LEFT;  Surgeon: Lyndle Herrlich, MD;  Location: [facility] ORS;  Service: Orthopedics;  Laterality: Left;   HPI:  Ryan Pace is a 89 y.o. male who was in his normal state of health until 04/09/2023 when he noticed that he had lower extremity weakness.   Brain MRI:     1. Cluster of small acute infarcts in the left corpus callosum body  and cingulate white matter, left ACA distribution.  2. Chronic small vessel ischemia to a moderate degree.   Assessment / Plan / Recommendation Clinical Impression  Pt presents with functional expressive and reception communication and appropriate cognitive abilities. His verbal communication is dysfluent c/b initial phoneme repetition that is intermittent and increased with conversational length utterances reducing his speech intelligibility to ~ 60%. When cued to slow rate of speech, pt was able to improve dysfluencies and achieve improved speech intelligibility of >90% at the sentence level.    SLP Assessment  SLP Recommendation/Assessment: All further Speech Lanaguage Pathology  needs can be addressed in the next venue of care SLP Visit Diagnosis: Apraxia (R48.2);Dysarthria and anarthria (R47.1)    Recommendations for follow up therapy are one component of a multi-disciplinary discharge planning process, led by the attending physician.  Recommendations may be updated based on patient status, additional functional criteria and insurance authorization.    Follow Up Recommendations  Home health SLP    Assistance Recommended at Discharge  None  Functional  Status Assessment Patient has had a recent decline in their functional status and/or demonstrates limited ability to make significant improvements in function in a reasonable and predictable amount of time  Frequency and Duration   N/A         SLP Evaluation Cognition  Overall Cognitive Status: Within Functional Limits for tasks assessed       Comprehension  Auditory Comprehension Overall Auditory Comprehension: Appears within functional limits for tasks assessed    Expression Expression Primary Mode of Expression: Verbal Verbal Expression Overall Verbal Expression: Impaired   Oral / Motor  Oral Motor/Sensory Function Overall Oral Motor/Sensory Function: Within functional limits Motor Speech Overall Motor Speech: Impaired Respiration: Within functional limits Phonation: Normal Resonance: Within functional limits Articulation: Within functional limitis Intelligibility: Intelligibility reduced Word: 75-100% accurate Phrase: 50-74% accurate Sentence: 50-74% accurate Conversation: 50-74% accurate Motor Planning: Impaired Level of Impairment: Phrase Motor Speech Errors: Aware;Groping for words;Inconsistent Effective Techniques: Slow rate            Ryan Pace 04/12/2023, 3:05 PM

## 2023-04-12 NOTE — Plan of Care (Signed)

## 2023-04-12 NOTE — TOC Initial Note (Addendum)
Transition of Care [facility]) - Initial/Assessment Note    Patient Details  Name: Ryan Pace MRN: [id number] Date of Birth: 08/17/1929  Transition of [facility]) CM/SW Contact:    Allena Katz, LCSW Phone Number: 04/12/2023, 3:10 PM  Clinical Narrative:     CSW spoke with pt regarding rehab. Pt is wanting to go to skilled even if it means private paying. Pt did not want to stay until Monday but [facility] unable to take until then. Pt states he would like son to make decision. CSW spoke with son and son would like to plan for Monday at [facility]. Kim notified.               3:19pm Auth submitted for [facility] for Monday.         Patient Goals and CMS Choice            Expected Discharge Plan and Services                                              Prior Living Arrangements/Services                       Activities of Daily Living Home Assistive Devices/Equipment: Dan Humphreys (specify type) ADL Screening (condition at time of admission) Patient's cognitive ability adequate to safely complete daily activities?: Yes Is the patient deaf or have difficulty hearing?: No Does the patient have difficulty seeing, even when wearing glasses/contacts?: No Does the patient have difficulty concentrating, remembering, or making decisions?: No Patient able to express need for assistance with ADLs?: Yes Does the patient have difficulty dressing or bathing?: No Independently performs ADLs?: Yes (appropriate for developmental age) Does the patient have difficulty walking or climbing stairs?: Yes Weakness of Legs: Both Weakness of Arms/Hands: None  Permission Sought/Granted                  Emotional Assessment              Admission diagnosis:  Cellulitis of right leg [L03.115] Generalized weakness [R53.1] Orthostatic hypotension [I95.1] Patient Active Problem List   Diagnosis Date Noted   Embolic stroke involving left anterior cerebral  artery (HCC) 04/11/2023   Acute kidney injury superimposed on CKD (HCC) 04/11/2023   Overweight (BMI 25.0-29.9) 04/11/2023   Skin lesion 04/11/2023   Generalized weakness 04/09/2023   Orthostatic hypotension 04/09/2023   Cellulitis 04/09/2023   Bilateral lower extremity edema 04/09/2023   Obstructive sleep apnea 04/18/2020   S/P TKR (total knee replacement) using cement, left 10/29/2018   Essential hypertension 10/08/2018   Osteoarthritis of left knee 09/19/2018   Hematoma 06/08/2018   Seborrheic dermatitis 05/28/2018   Dupuytren's contracture 12/12/2015   Insomnia with sleep apnea 10/12/2015   Hyperlipidemia, mixed 03/09/2015   Moderate mitral insufficiency 09/06/2014   Paroxysmal A-fib (HCC) 09/06/2014   CAD (coronary artery disease), native coronary artery 02/05/2014   PCP:  Barbette Reichmann, MD Pharmacy:   [id number] [address] [facility], [place] - [address] [place] [address] Phone: [phone] Fax: [phone]     Social Determinants of Health (SDOH) Social History: SDOH Screenings   Food Insecurity: Patient Declined (04/09/2023)  Housing: Patient Declined (04/09/2023)  Tobacco Use: Medium Risk (04/09/2023)   SDOH Interventions:     Readmission Risk Interventions     No data  to display

## 2023-04-12 NOTE — Progress Notes (Signed)
Progress Note   Patient: Ryan Pace:[id number] DOB: 01-29-1929 DOA: 04/09/2023     2 DOS: the patient was seen and examined on 04/12/2023   Brief hospital course: 89 y.o. male with medical history significant of atrial fibrillation on Eliquis, chronic kidney disease stage IIIb, hypertension, hyperlipidemia, nephrolithiasis, anemia, who presents to the ED due to weakness.   Ryan Pace states that beginning today, he noticed that when he sat on the recliner and went to go stand up, he was unable to do so.  He has particular weakness in his bilateral lower extremities, but feels that his bilateral upper extremities also feel weaker than usual.  He notes difficult workout session yesterday with physical therapy and believes he did not hydrate very well over the last 24 hours.  Otherwise he denies any acute symptoms, including dizziness, headache, chest pain, shortness of breath, palpitations, dysuria, nausea, vomiting, abdominal pain, or localized weakness.   ED course: On arrival to the ED, patient was hypertensive at 162/89 with heart rate of 75.  He was saturating at 95% on room air.  He was afebrile at 98.2.  Workup demonstrates platelets 135, glucose 102, BUN 30, creatinine 1.54 with GFR 42.  Urinalysis with no leukocytes, nitrites.  Troponin at 22 within patient's baseline.  Due to concern for cellulitis, Rocephin was started.  Due to generalized weakness with positive orthostatics, [facility] contacted for admission.  8/14.  Patient with orthostatic hypotension.  Will order TED hose and gentle IV fluids.  Holding all blood pressure medications except for Cardizem CD.  Would rather have blood pressure on the higher side than to low. 8/15.  Overnight code stroke was called.  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  Patient was started on aspirin and Eliquis held.  Neurology consultation. 8/16.  Patient doing better  with physical therapy.  Intermittent trouble with his words.  Strength has improved.  Neurology gave the clearance to go back on Eliquis.  He is a candidate for 5 mg twice a day.  Assessment and Plan: * Embolic stroke involving left anterior cerebral artery (HCC) Aspirin given this morning but will discontinue after today's dose.  Eliquis will be restarted this evening at 5 mg twice a day.  Lipitor 40 mg daily.  Patient did better with physical therapy and Occupational Therapy.  Orthostatic hypotension Continue to check orthostatics.  TED hose.  Holding lisinopril.  Continue Calan SR.  Generalized weakness Likely secondary to acute stroke and orthostatic hypotension.  Continue working with PT and OT.  Cellulitis Right lower extremity.  Patient placed on Rocephin by admitting physician.  Will change over to Keflex.  Bilateral lower extremity edema TED hose.   Acute kidney injury superimposed on CKD (HCC) Acute kidney injury on CKD stage IIIb.  Creatinine improved from 1.54 down to 1.18 with IV fluid hydration.   Paroxysmal A-fib (HCC) Continue home verapamil and increased dose.  Restart Eliquis this evening at 5 mg twice daily.  Essential hypertension Continue Calan and hold lisinopril.  Skin lesion Behind right ear.  Likely skin cancer.  Recommend dermatology follow-up as outpatient.  Overweight (BMI 25.0-29.9) BMI listed as 29.53.        Subjective: Patient feeling better.  Feeling a little stronger.  Heart rate up to 140s with ambulation today.  Physical Exam: Vitals:   04/11/23 2314 04/12/23 0324 04/12/23 0823 04/12/23 1100  BP: (!) 137/98 (!) 180/94 (!) 146/98 134/88  Pulse: (!) 102 80 97 (!) 108  Resp: 20 18 18 18   Temp: 98.4 F (36.9 C) 98.2 F (36.8 C) (!) 97.5 F (36.4 C) 98 F (36.7 C)  TempSrc:   Oral Oral  SpO2: 96% 97% 97% 100%  Weight:      Height:       Physical Exam HENT:     Head: Normocephalic.     Mouth/Throat:     Pharynx: No  oropharyngeal exudate.  Eyes:     General: Lids are normal.     Conjunctiva/sclera: Conjunctivae normal.  Cardiovascular:     Rate and Rhythm: Regular rhythm. Tachycardia present.     Heart sounds: Normal heart sounds, S1 normal and S2 normal.  Pulmonary:     Breath sounds: No decreased breath sounds, wheezing, rhonchi or rales.  Abdominal:     Palpations: Abdomen is soft.     Tenderness: There is no abdominal tenderness.  Musculoskeletal:     Right lower leg: Swelling present.     Left lower leg: Swelling present.  Skin:    General: Skin is warm.     Findings: No rash.     Comments: Behind right ear 2 areas of growth looks like a skin cancer.  Neurological:     Mental Status: He is alert and oriented to person, place, and time.     Comments: Generalized weakness total body.     Data Reviewed: LDL 122  Family Communication: Updated patient's son on the phone  Disposition: Status is: Inpatient Remains inpatient appropriate because: Will restart Eliquis this evening.  Potential disposition back to his facility over the weekend.  Patient's son interested in the rehab portion.  Planned Discharge Destination: Son interested in rehab portion of his facility instead of the independent living.    Time spent: 28 minutes Case discussed with neurology.  Author: Alford Highland, MD 04/12/2023 2:14 PM  For on call review [email or web address].

## 2023-04-12 NOTE — Progress Notes (Signed)
       CROSS COVER NOTE  NAME: Ryan Pace MRN: [id number] DOB : [birth year]    Concern as stated by nurse / staff    "diagnosed with embolic stroke and cellulitis. 89 y.o. male with medical history significant of atrial fibrillation on Eliquis, chronic kidney disease stage IIIb, hypertension, hyperlipidemia, nephrolithiasis, anemia. Patient was started on trazodone to help him sleep tonight. He states he does not want to take the trazodone as it does not help him sleep. He is requesting something else tohelp him sleep. The muscle relaxer did help him lastshould I hold this patient's verapamil due to his stroke? even though I believe he takes this for HTN and artial arrythmia?  night sleep and relieve his neck pain. no current neck pain"    Pertinent findings on chart review: See prior note  Assessment and  Interventions   Assessment:    04/11/2023   11:14 PM 04/11/2023    7:16 PM 04/11/2023    3:16 PM  Vitals with BMI  Systolic 137 153 478  Diastolic 98 84 93  Pulse 102 88 91    Plan: Dose of acetaminophen and robaxin ordered Held verapamil dose tonight       Donnie Mesa NP [facility] Cross Cover 7pm-7am - check amion for availability Pager [phone]

## 2023-04-12 NOTE — Progress Notes (Signed)
Physical Therapy Treatment Patient Details Name: Ryan Pace MRN: [id number] DOB: 12-15-1928 Today's Date: 04/12/2023   History of Present Illness Pt is a 89 y.o. male presenting to hospital 04/09/23 with c/o fall (back into his chair) and generalized weakness (acute onset).  Pt admitted with generalized weakness, orthostatic hypotension, cellulitis, B LE edema.  PMH includes HLD, anemia, a-fib, kidney stones, a-fib, L TKA, B dupuytren contracture release. 8/15  Overnight code stroke was called.  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  Patient was started on aspirin and Eliquis held.  Neurology consultation.    PT Comments  Patient received in bed, appears less alert this session than yesterday. He is agreeable to PT session. He required min A for supine to sit and min/mod A for sit to stand from bed. Multiple attempts and assistance needed to boost up to standing with slight elevation of bed height  Patient then able to ambulate around nursing station and into bathroom. Patient educated to call when ready, but got up from commode and washed hands prior to calling for assistance. He will continue to benefit from skilled PT to improve strength, safety and functional independence.      If plan is discharge home, recommend the following: A little help with walking and/or transfers;A little help with bathing/dressing/bathroom;Assistance with cooking/housework;Help with stairs or ramp for entrance;Assist for transportation   Can travel by private vehicle     Yes  Equipment Recommendations  Rolling walker (2 wheels);BSC/3in1    Recommendations for Other Services       Precautions / Restrictions Precautions Precautions: Fall Precaution Comments: monitor BP Restrictions Weight Bearing Restrictions: No     Mobility  Bed Mobility Overal bed mobility: Needs Assistance Bed Mobility: Supine to Sit     Supine  to sit: Min assist     General bed mobility comments: patient with difficulty getting oob this session. He required min A.    Transfers Overall transfer level: Needs assistance Equipment used: Rolling walker (2 wheels) Transfers: Sit to/from Stand Sit to Stand: Min assist, Mod assist           General transfer comment: Min/mod A needed to stand from bed. Required bed to be elevated slightly and then still needed min A to boost up to standing.    Ambulation/Gait Ambulation/Gait assistance: Contact guard assist Gait Distance (Feet): 180 Feet Assistive device: Rolling walker (2 wheels) Gait Pattern/deviations: Step-through pattern, Decreased step length - right, Decreased step length - left Gait velocity: decreased     General Gait Details: Ambulating well with cga. No lob, no signifcant fatigue.   Stairs             Wheelchair Mobility     Tilt Bed    Modified Rankin (Stroke Patients Only)       Balance Overall balance assessment: Needs assistance Sitting-balance support: Feet supported Sitting balance-Leahy Scale: Fair     Standing balance support: Bilateral upper extremity supported, During functional activity, Reliant on assistive device for balance Standing balance-Leahy Scale: Fair Standing balance comment: reliant on B UE support                            Cognition Arousal: Alert Behavior During Therapy: WFL for tasks assessed/performed Overall Cognitive Status: Within Functional Limits for tasks assessed Area of Impairment: Problem solving, Safety/judgement  Safety/Judgement: Decreased awareness of safety   Problem Solving: Slow processing, Requires verbal cues General Comments: mentation slower today, less conversational        Exercises      General Comments        Pertinent Vitals/Pain Pain Assessment Pain Assessment: No/denies pain    Home Living Family/patient expects to be  discharged to:: Skilled nursing facility Living Arrangements: Spouse/significant other Available Help at Discharge: Family Type of Home: Independent living facility                  Prior Function            PT Goals (current goals can now be found in the care plan section) Acute Rehab PT Goals Patient Stated Goal: to improve strength and mobility PT Goal Formulation: With patient Time For Goal Achievement: 04/24/23 Potential to Achieve Goals: Good Progress towards PT goals: Progressing toward goals    Frequency    7X/week      PT Plan      Co-evaluation              AM-PAC PT "6 Clicks" Mobility   Outcome Measure  Help needed turning from your back to your side while in a flat bed without using bedrails?: A Little Help needed moving from lying on your back to sitting on the side of a flat bed without using bedrails?: A Little Help needed moving to and from a bed to a chair (including a wheelchair)?: A Little Help needed standing up from a chair using your arms (e.g., wheelchair or bedside chair)?: A Lot Help needed to walk in hospital room?: A Little Help needed climbing 3-5 steps with a railing? : A Lot 6 Click Score: 16    End of Session Equipment Utilized During Treatment: Gait belt Activity Tolerance: Patient tolerated treatment well Patient left: in chair;with call bell/phone within reach;with chair alarm set Nurse Communication: Mobility status PT Visit Diagnosis: Difficulty in walking, not elsewhere classified (R26.2);Muscle weakness (generalized) (M62.81)     Time: 5366-4403 PT Time Calculation (min) (ACUTE ONLY): 17 min  Charges:    $Gait Training: 8-22 mins PT General Charges $$ ACUTE PT VISIT: 1 Visit                     Virgal Warmuth, PT, GCS 04/12/23,3:59 PM

## 2023-04-13 ENCOUNTER — Inpatient Hospital Stay: Payer: Medicare HMO

## 2023-04-13 DIAGNOSIS — L03115 Cellulitis of right lower limb: Secondary | ICD-10-CM | POA: Diagnosis not present

## 2023-04-13 DIAGNOSIS — I951 Orthostatic hypotension: Secondary | ICD-10-CM | POA: Diagnosis not present

## 2023-04-13 DIAGNOSIS — R531 Weakness: Secondary | ICD-10-CM | POA: Diagnosis not present

## 2023-04-13 DIAGNOSIS — I63422 Cerebral infarction due to embolism of left anterior cerebral artery: Secondary | ICD-10-CM | POA: Diagnosis not present

## 2023-04-13 LAB — CBC
HCT: 40.5 % (ref 39.0–52.0)
Hemoglobin: 13.6 g/dL (ref 13.0–17.0)
MCH: 30.6 pg (ref 26.0–34.0)
MCHC: 33.6 g/dL (ref 30.0–36.0)
MCV: 91.2 fL (ref 80.0–100.0)
Platelets: 136 10*3/uL — ABNORMAL LOW (ref 150–400)
RBC: 4.44 MIL/uL (ref 4.22–5.81)
RDW: 13.9 % (ref 11.5–15.5)
WBC: 7 10*3/uL (ref 4.0–10.5)
nRBC: 0 % (ref 0.0–0.2)

## 2023-04-13 LAB — BASIC METABOLIC PANEL
Anion gap: 5 (ref 5–15)
BUN: 32 mg/dL — ABNORMAL HIGH (ref 8–23)
CO2: 27 mmol/L (ref 22–32)
Calcium: 8.8 mg/dL — ABNORMAL LOW (ref 8.9–10.3)
Chloride: 107 mmol/L (ref 98–111)
Creatinine, Ser: 1.27 mg/dL — ABNORMAL HIGH (ref 0.61–1.24)
GFR, Estimated: 53 mL/min — ABNORMAL LOW (ref >60)
Glucose, Bld: 92 mg/dL (ref 70–99)
Potassium: 4 mmol/L (ref 3.5–5.1)
Sodium: 139 mmol/L (ref 135–145)

## 2023-04-13 MED ORDER — ACETAMINOPHEN 500 MG PO TABS
1000.0000 mg | ORAL_TABLET | Freq: Four times a day (QID) | ORAL | Status: DC | PRN
  Administered 2023-04-13: 1000 mg via ORAL
  Filled 2023-04-13 (×2): qty 2

## 2023-04-13 MED ORDER — ACETAMINOPHEN 650 MG RE SUPP: 650.0000 mg | Freq: Four times a day (QID) | RECTAL | Status: DC | PRN

## 2023-04-13 MED ORDER — METHOCARBAMOL 500 MG PO TABS
500.0000 mg | ORAL_TABLET | Freq: Once | ORAL | Status: AC
  Administered 2023-04-13: 500 mg via ORAL
  Filled 2023-04-13: qty 1

## 2023-04-13 NOTE — Plan of Care (Signed)

## 2023-04-13 NOTE — Progress Notes (Signed)
Physical Therapy Treatment Patient Details Name: Ryan Pace MRN: [id number] DOB: 1929/08/08 Today's Date: 04/13/2023   History of Present Illness Pt is a 89 y.o. male presenting to hospital 04/09/23 with c/o fall (back into his chair) and generalized weakness (acute onset).  Pt admitted with generalized weakness, orthostatic hypotension, cellulitis, B LE edema.  PMH includes HLD, anemia, a-fib, kidney stones, a-fib, L TKA, B dupuytren contracture release. 8/15  Overnight code stroke was called.  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  Patient was started on aspirin and Eliquis held.  Neurology consultation.    PT Comments  Pt was long sitting in bed upon arrival. He is alert and oriented + cooperative. Supportive son arrived during session. PT required more assistance today to exit bed and stand to RW. BP and HR were stable throughout. He was able to tolerate ambulation ~ 120 ft but overall endorses feeling and doing better previous day. Pt would benefit from continued skilled PT to maximize his safety and independence with all ADLs. DC recs remain appropriate.     If plan is discharge home, recommend the following: A little help with walking and/or transfers;A little help with bathing/dressing/bathroom;Assistance with cooking/housework;Help with stairs or ramp for entrance;Assist for transportation     Equipment Recommendations  Rolling walker (2 wheels);BSC/3in1       Precautions / Restrictions Precautions Precautions: Fall Restrictions Weight Bearing Restrictions: No     Mobility  Bed Mobility Overal bed mobility: Needs Assistance Bed Mobility: Supine to Sit  Supine to sit: Mod assist, Max assist, HOB elevated  General bed mobility comments: increrased assistance required to exit bed this morning.    Transfers Overall transfer level: Needs assistance Equipment used: Rolling walker (2  wheels) Transfers: Sit to/from Stand Sit to Stand: Mod assist  General transfer comment: mod assist to stand from EOB    Ambulation/Gait Ambulation/Gait assistance: Contact guard assist Gait Distance (Feet): 120 Feet Assistive device: Rolling walker (2 wheels) Gait Pattern/deviations: Step-through pattern, Decreased step length - right, Decreased step length - left, Trunk flexed, Wide base of support Gait velocity: decreased  General Gait Details: pt ambulated ~ 120 ft with RW. no LOB hoowever distance limited by fatigue.     Balance Overall balance assessment: Needs assistance Sitting-balance support: Feet supported Sitting balance-Leahy Scale: Fair     Standing balance support: Bilateral upper extremity supported, During functional activity, Reliant on assistive device for balance Standing balance-Leahy Scale: Fair       Cognition Arousal: Alert Behavior During Therapy: WFL for tasks assessed/performed Overall Cognitive Status: Within Functional Limits for tasks assessed Area of Impairment: Problem solving, Safety/judgement      Safety/Judgement: Decreased awareness of deficits, Decreased awareness of safety   Problem Solving: Slow processing, Requires verbal cues                 Pertinent Vitals/Pain Pain Assessment Pain Assessment: No/denies pain     PT Goals (current goals can now be found in the care plan section) Acute Rehab PT Goals Patient Stated Goal: to improve strength and mobility Progress towards PT goals: Progressing toward goals    Frequency    7X/week       AM-PAC PT "6 Clicks" Mobility   Outcome Measure  Help needed turning from your back to your side while in a flat bed without using bedrails?: A Little Help needed moving from lying on your back to sitting on  the side of a flat bed without using bedrails?: A Lot Help needed moving to and from a bed to a chair (including a wheelchair)?: A Lot Help needed standing up from a chair  using your arms (e.g., wheelchair or bedside chair)?: A Lot Help needed to walk in hospital room?: A Little Help needed climbing 3-5 steps with a railing? : A Lot 6 Click Score: 14    End of Session Equipment Utilized During Treatment: Gait belt Activity Tolerance: Patient tolerated treatment well;Patient limited by fatigue Patient left: in chair;with call bell/phone within reach;with chair alarm set;with family/visitor present Nurse Communication: Mobility status PT Visit Diagnosis: Difficulty in walking, not elsewhere classified (R26.2);Muscle weakness (generalized) (M62.81)     Time: 1610-9604 PT Time Calculation (min) (ACUTE ONLY): 23 min  Charges:    $Gait Training: 8-22 mins $Therapeutic Activity: 8-22 mins PT General Charges $$ ACUTE PT VISIT: 1 Visit                    Jetta Lout PTA 04/13/23, 9:16 AM

## 2023-04-13 NOTE — Progress Notes (Signed)
Progress Note   Patient: Ryan Pace BJS:[id number] DOB: 09/23/1928 DOA: 04/09/2023     3 DOS: the patient was seen and examined on 04/13/2023   Brief hospital course: 89 y.o. male with medical history significant of atrial fibrillation on Eliquis, chronic kidney disease stage IIIb, hypertension, hyperlipidemia, nephrolithiasis, anemia, who presents to the ED due to weakness.   Ryan Pace states that beginning today, he noticed that when he sat on the recliner and went to go stand up, he was unable to do so.  He has particular weakness in his bilateral lower extremities, but feels that his bilateral upper extremities also feel weaker than usual.  He notes difficult workout session yesterday with physical therapy and believes he did not hydrate very well over the last 24 hours.  Otherwise he denies any acute symptoms, including dizziness, headache, chest pain, shortness of breath, palpitations, dysuria, nausea, vomiting, abdominal pain, or localized weakness.   ED course: On arrival to the ED, patient was hypertensive at 162/89 with heart rate of 75.  He was saturating at 95% on room air.  He was afebrile at 98.2.  Workup demonstrates platelets 135, glucose 102, BUN 30, creatinine 1.54 with GFR 42.  Urinalysis with no leukocytes, nitrites.  Troponin at 22 within patient's baseline.  Due to concern for cellulitis, Rocephin was started.  Due to generalized weakness with positive orthostatics, [facility] contacted for admission.  8/14.  Patient with orthostatic hypotension.  Will order TED hose and gentle IV fluids.  Holding all blood pressure medications except for Cardizem CD.  Would rather have blood pressure on the higher side than to low. 8/15.  Overnight code stroke was called.  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  Patient was started on aspirin and Eliquis held.  Neurology consultation. 8/16.  Patient doing better  with physical therapy.  Intermittent trouble with his words.  Strength has improved.  Neurology gave the clearance to go back on Eliquis.  He is a candidate for 5 mg twice a day. 8/17.  Patient having more difficulty with articulation.  Noticed it more this morning.  Stat CT scan of the head negative for hemorrhage.  No visible progression of patient's acute infarct.  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  Lipitor 40 mg daily.  Stat CT scan of the head done on 8/17 showed a stable exam without hemorrhage.  Patient will go out to rehab on Monday.  Patient still having some difficulty with articulation.  Orthostatic hypotension Continue to check orthostatics.  TED hose.  Holding lisinopril.  Continue Calan SR.  Generalized weakness Likely secondary to acute stroke and orthostatic hypotension.  Continue working with PT and OT.  Cellulitis Right lower extremity.  Will finish a 5-day course today.  Bilateral lower extremity edema TED hose.   Acute kidney injury superimposed on CKD (HCC) Acute kidney injury on CKD stage IIIb.  Creatinine improved from 1.54 down to 1.18 with IV fluid hydration. Today's creatinine 1.27.  Paroxysmal A-fib (HCC) Continue home verapamil At increased dose.  Restarted Eliquis on 8/16 at 5 mg twice daily.  Essential hypertension Continue Calan and hold lisinopril.  Skin lesion Behind right ear.  Likely skin cancer.  Recommend dermatology follow-up as outpatient.  Overweight (BMI 25.0-29.9) BMI listed as 29.53.        Subjective: Patient having  more difficulty with articulation.  Does not think it happened last night.  Does not feel weak 1 side versus the other.  Physical Exam: Vitals:   04/13/23 0051 04/13/23 0506 04/13/23 0801 04/13/23 1154  BP: (!) 159/92 (!) 146/84 127/80 124/80  Pulse: 89 71 77 67  Resp: 16 16 18  (!) 21  Temp: 98.3 F (36.8 C)  (!) 97.5 F (36.4 C) 98 F (36.7 C) 97.8 F (36.6 C)  TempSrc: Oral Oral    SpO2: 98% 96% 97% 99%  Weight:      Height:       Physical Exam HENT:     Head: Normocephalic.     Mouth/Throat:     Pharynx: No oropharyngeal exudate.  Eyes:     General: Lids are normal.     Conjunctiva/sclera: Conjunctivae normal.  Cardiovascular:     Rate and Rhythm: Regular rhythm. Tachycardia present.     Heart sounds: Normal heart sounds, S1 normal and S2 normal.  Pulmonary:     Breath sounds: No decreased breath sounds, wheezing, rhonchi or rales.  Abdominal:     Palpations: Abdomen is soft.     Tenderness: There is no abdominal tenderness.  Musculoskeletal:     Right lower leg: Swelling present.     Left lower leg: Swelling present.  Skin:    General: Skin is warm.     Findings: No rash.     Comments: Behind right ear 2 areas of growth looks like a skin cancer.  Neurological:     Mental Status: He is alert and oriented to person, place, and time.     Comments: Power 4+ out of 5 bilateral upper and lower extremities.     Data Reviewed: Stat CT scan of the head showed stable exam.  No hemorrhage. Platelet 136, hemoglobin 13.6, creatinine 1.27  Family Communication: Spoke with son at the bedside  Disposition: Status is: Inpatient Remains inpatient appropriate because: Needed a stat CT scan of the hand today to rule out hemorrhage.  Hopefully will go out to rehab on Monday  Planned Discharge Destination: Rehab    Time spent: 28 minutes  Author: Alford Highland, MD 04/13/2023 1:33 PM  For on call review [email or web address].

## 2023-04-13 NOTE — Progress Notes (Signed)
Occupational Therapy Treatment Patient Details Name: Ryan Pace MRN: [id number] DOB: 1929-05-11 Today's Date: 04/13/2023   History of present illness Pt is a 89 y.o. male presenting to hospital 04/09/23 with c/o fall (back into his chair) and generalized weakness (acute onset).  Pt admitted with generalized weakness, orthostatic hypotension, cellulitis, B LE edema.  PMH includes HLD, anemia, a-fib, kidney stones, a-fib, L TKA, B dupuytren contracture release. 8/15  Overnight code stroke was called.  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  Patient was started on aspirin and Eliquis held.  Neurology consultation.   OT comments  Mr Ignacia Palma was seen for OT treatment on this date. Upon arrival to room pt seated in chair, agreeable to tx. Pt requires MIN A + RW for ADL t/f, increased difficulty advancing RLE. MIN A tooth bruhsing standing sink side, assist for balance, R lateral lean noted. Seated rest break, R lateral lean in sitting, corrects to midline with cues. Pt reports fatigue. BUE strength and smile symmetrical - RN notified of new R lean, will take orthostatics next session. Pt making progress toward goals, will continue to follow POC. Discharge recommendation remains appropriate.       If plan is discharge home, recommend the following:  A little help with walking and/or transfers;A little help with bathing/dressing/bathroom;Assist for transportation;Help with stairs or ramp for entrance   Equipment Recommendations  BSC/3in1    Recommendations for Other Services      Precautions / Restrictions Precautions Precautions: Fall Restrictions Weight Bearing Restrictions: No       Mobility Bed Mobility Overal bed mobility: Needs Assistance Bed Mobility: Sit to Supine       Sit to supine: Mod assist   General bed mobility comments: assist for BLE    Transfers Overall transfer level: Needs  assistance Equipment used: Rolling walker (2 wheels) Transfers: Sit to/from Stand Sit to Stand: Mod assist                 Balance Overall balance assessment: Needs assistance Sitting-balance support: Feet supported Sitting balance-Leahy Scale: Poor   Postural control: Right lateral lean Standing balance support: Single extremity supported, During functional activity Standing balance-Leahy Scale: Poor Standing balance comment: R lateral lean, corrects with cues                           ADL either performed or assessed with clinical judgement   ADL Overall ADL's : Needs assistance/impaired                                       General ADL Comments: MIN A + RW for ADL t/f. MIN A tooth bruhsing standing sink side, assist for balance, R lateral lean noted      Cognition Arousal: Alert Behavior During Therapy: WFL for tasks assessed/performed Overall Cognitive Status: Within Functional Limits for tasks assessed Area of Impairment: Problem solving, Safety/judgement                         Safety/Judgement: Decreased awareness of deficits, Decreased awareness of safety   Problem Solving: Slow processing, Requires verbal cues                     Pertinent Vitals/ Pain  Pain Assessment Pain Assessment: No/denies pain   Frequency  Min 1X/week        Progress Toward Goals  OT Goals(current goals can now be found in the care plan section)  Progress towards OT goals: Progressing toward goals  Acute Rehab OT Goals Patient Stated Goal: to improve safety OT Goal Formulation: With patient Time For Goal Achievement: 04/24/23 Potential to Achieve Goals: Good ADL Goals Pt Will Perform Grooming: with modified independence;sitting Pt Will Perform Lower Body Dressing: with modified independence;sitting/lateral leans Pt Will Transfer to Toilet: with modified independence Pt Will Perform Toileting - Clothing Manipulation  and hygiene: with modified independence;sit to/from stand   AM-PAC OT "6 Clicks" Daily Activity     Outcome Measure   Help from another person eating meals?: None Help from another person taking care of personal grooming?: A Little Help from another person toileting, which includes using toliet, bedpan, or urinal?: A Little Help from another person bathing (including washing, rinsing, drying)?: A Lot Help from another person to put on and taking off regular upper body clothing?: A Little Help from another person to put on and taking off regular lower body clothing?: A Lot 6 Click Score: 17    End of Session    OT Visit Diagnosis: Unsteadiness on feet (R26.81);Muscle weakness (generalized) (M62.81)   Activity Tolerance Patient tolerated treatment well   Patient Left in bed;with call bell/phone within reach;with bed alarm set   Nurse Communication Mobility status (R lateral lean)        Time: 8413-2440 OT Time Calculation (min): 13 min  Charges: OT General Charges $OT Visit: 1 Visit OT Treatments $Self Care/Home Management : 8-22 mins  Kathie Dike, M.S. OTR/L  04/13/23, 12:57 PM  ascom [phone]

## 2023-04-14 DIAGNOSIS — L03115 Cellulitis of right lower limb: Secondary | ICD-10-CM | POA: Diagnosis not present

## 2023-04-14 DIAGNOSIS — R531 Weakness: Secondary | ICD-10-CM | POA: Diagnosis not present

## 2023-04-14 DIAGNOSIS — I63422 Cerebral infarction due to embolism of left anterior cerebral artery: Secondary | ICD-10-CM | POA: Diagnosis not present

## 2023-04-14 DIAGNOSIS — I951 Orthostatic hypotension: Secondary | ICD-10-CM | POA: Diagnosis not present

## 2023-04-14 MED ORDER — TRAZODONE HCL 50 MG PO TABS
100.0000 mg | ORAL_TABLET | Freq: Every day | ORAL | Status: DC
  Administered 2023-04-14: 100 mg via ORAL
  Filled 2023-04-14: qty 2

## 2023-04-14 NOTE — Progress Notes (Addendum)
Physical Therapy Treatment Patient Details Name: Ryan Pace MRN: [id number] DOB: 1929/06/22 Today's Date: 04/14/2023   History of Present Illness Pt is a 89 y.o. male presenting to hospital 04/09/23 with c/o fall (back into his chair) and generalized weakness (acute onset).  Pt admitted with generalized weakness, orthostatic hypotension, cellulitis, B LE edema.  PMH includes HLD, anemia, a-fib, kidney stones, a-fib, L TKA, B dupuytren contracture release. 8/15  Overnight code stroke was called.  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  Patient was started on aspirin and Eliquis held.  Neurology consultation.    PT Comments  In chair.  Stated he slept in chair as bed in not comfortable.  Stands with mod a x 1. He is able to walk 120' x 1 with RW and short shuffling steps with decreased step height and speed.  No LOB but increased fall risk.  He stated he is not at his baseline and typically walks well with a rollator.  Stated he was fatigued with activity and did not want to do more at this time. Will benefit from continued therapies at discharge.   If plan is discharge home, recommend the following: A little help with walking and/or transfers;A little help with bathing/dressing/bathroom;Assistance with cooking/housework;Help with stairs or ramp for entrance;Assist for transportation   Can travel by private vehicle        Equipment Recommendations  Rolling walker (2 wheels);BSC/3in1    Recommendations for Other Services       Precautions / Restrictions Precautions Precautions: Fall Restrictions Weight Bearing Restrictions: No     Mobility  Bed Mobility               General bed mobility comments: in chair before and after    Transfers Overall transfer level: Needs assistance Equipment used: Rolling walker (2 wheels) Transfers: Sit to/from Stand Sit to Stand: Mod assist            General transfer comment: difficulty standing    Ambulation/Gait Ambulation/Gait assistance: Min assist Gait Distance (Feet): 120 Feet Assistive device: Rolling walker (2 wheels) Gait Pattern/deviations: Step-through pattern, Decreased step length - right, Decreased step length - left, Trunk flexed, Wide base of support Gait velocity: decreased         Stairs             Wheelchair Mobility     Tilt Bed    Modified Rankin (Stroke Patients Only)       Balance Overall balance assessment: Needs assistance Sitting-balance support: Feet supported Sitting balance-Leahy Scale: Fair     Standing balance support: Bilateral upper extremity supported Standing balance-Leahy Scale: Poor Standing balance comment: R lateral lean, corrects with cues                            Cognition Arousal: Alert Behavior During Therapy: WFL for tasks assessed/performed Overall Cognitive Status: Within Functional Limits for tasks assessed                                          Exercises      General Comments        Pertinent Vitals/Pain Pain Assessment Pain Assessment: No/denies pain    Home Living  Prior Function            PT Goals (current goals can now be found in the care plan section) Progress towards PT goals: Progressing toward goals    Frequency    7X/week      PT Plan      Co-evaluation              AM-PAC PT "6 Clicks" Mobility   Outcome Measure  Help needed turning from your back to your side while in a flat bed without using bedrails?: A Little Help needed moving from lying on your back to sitting on the side of a flat bed without using bedrails?: A Lot Help needed moving to and from a bed to a chair (including a wheelchair)?: A Lot Help needed standing up from a chair using your arms (e.g., wheelchair or bedside chair)?: A Lot Help needed to walk in hospital room?: A  Little Help needed climbing 3-5 steps with a railing? : A Lot 6 Click Score: 14    End of Session Equipment Utilized During Treatment: Gait belt   Patient left: in chair;with call bell/phone within reach;with chair alarm set Nurse Communication: Mobility status PT Visit Diagnosis: Difficulty in walking, not elsewhere classified (R26.2);Muscle weakness (generalized) (M62.81)     Time: 7829-5621 PT Time Calculation (min) (ACUTE ONLY): 9 min  Charges:    $Gait Training: 8-22 mins PT General Charges $$ ACUTE PT VISIT: 1 Visit                   Danielle Dess, PTA 04/14/23, 9:29 AM

## 2023-04-14 NOTE — Progress Notes (Signed)
Progress Note   Patient: Ryan Pace LKG:[id number] DOB: Jul 19, 1929 DOA: 04/09/2023     4 DOS: the patient was seen and examined on 04/14/2023   Brief hospital course: 89 y.o. male with medical history significant of atrial fibrillation on Eliquis, chronic kidney disease stage IIIb, hypertension, hyperlipidemia, nephrolithiasis, anemia, who presents to the ED due to weakness.   Ryan Pace states that beginning today, he noticed that when he sat on the recliner and went to go stand up, he was unable to do so.  He has particular weakness in his bilateral lower extremities, but feels that his bilateral upper extremities also feel weaker than usual.  He notes difficult workout session yesterday with physical therapy and believes he did not hydrate very well over the last 24 hours.  Otherwise he denies any acute symptoms, including dizziness, headache, chest pain, shortness of breath, palpitations, dysuria, nausea, vomiting, abdominal pain, or localized weakness.   ED course: On arrival to the ED, patient was hypertensive at 162/89 with heart rate of 75.  He was saturating at 95% on room air.  He was afebrile at 98.2.  Workup demonstrates platelets 135, glucose 102, BUN 30, creatinine 1.54 with GFR 42.  Urinalysis with no leukocytes, nitrites.  Troponin at 22 within patient's baseline.  Due to concern for cellulitis, Rocephin was started.  Due to generalized weakness with positive orthostatics, [facility] contacted for admission.  8/14.  Patient with orthostatic hypotension.  Will order TED hose and gentle IV fluids.  Holding all blood pressure medications except for Cardizem CD.  Would rather have blood pressure on the higher side than to low. 8/15.  Overnight code stroke was called.  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  Patient was started on aspirin and Eliquis held.  Neurology consultation. 8/16.  Patient doing better  with physical therapy.  Intermittent trouble with his words.  Strength has improved.  Neurology gave the clearance to go back on Eliquis.  He is a candidate for 5 mg twice a day. 8/17.  Patient having more difficulty with articulation.  Noticed it more this morning.  Stat CT scan of the head negative for hemorrhage.  No visible progression of patient's acute infarct. 8/18.  Patient still having issues with articulation.  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  Lipitor 40 mg daily.  Stat CT scan of the head done on 8/17 showed a stable exam without hemorrhage.  Patient will go out to rehab on Monday.  Patient still having difficulty with articulation.  Orthostatic hypotension Continue to check orthostatics.  TED hose.  Holding lisinopril.  Continue Calan SR.  Generalized weakness Likely secondary to acute stroke and orthostatic hypotension.  Continue working with PT and OT.  Cellulitis Right lower extremity.  Completed antibiotics.  Bilateral lower extremity edema TED hose.   Acute kidney injury superimposed on CKD (HCC) Acute kidney injury on CKD stage IIIb.  Creatinine improved from 1.54 down to 1.18 with IV fluid hydration.  Yesterday's creatinine 1.27.  Paroxysmal A-fib (HCC) Continue verapamil at increased dose.  Restarted Eliquis on 8/16 at 5 mg twice daily.  Essential hypertension Continue Calan and hold lisinopril.  Skin lesion Behind right ear.  Likely skin cancer.  Recommend dermatology follow-up as outpatient.  Overweight (BMI 25.0-29.9) BMI listed as 29.53.  Subjective: Patient still having trouble articulating.  Strength is improving.  Asking for something for sleep.  Admitted for generalized weakness found to have orthostatic hypotension and stroke.  Physical Exam: Vitals:   04/13/23 2117 04/13/23 2200 04/14/23 0400 04/14/23 0511  BP: 118/81   127/76   Pulse: (!) 107   65  Resp:  17 16 16   Temp:    (!) 97.4 F (36.3 C)  TempSrc:    Oral  SpO2: 97%   99%  Weight:      Height:       Physical Exam HENT:     Head: Normocephalic.     Mouth/Throat:     Pharynx: No oropharyngeal exudate.  Eyes:     General: Lids are normal.     Conjunctiva/sclera: Conjunctivae normal.  Cardiovascular:     Rate and Rhythm: Normal rate and regular rhythm.     Heart sounds: Normal heart sounds, S1 normal and S2 normal.  Pulmonary:     Breath sounds: No decreased breath sounds, wheezing, rhonchi or rales.  Abdominal:     Palpations: Abdomen is soft.     Tenderness: There is no abdominal tenderness.  Musculoskeletal:     Right lower leg: Swelling present.     Left lower leg: Swelling present.  Skin:    General: Skin is warm.     Findings: No rash.     Comments: Behind right ear 2 areas of growth looks like a skin cancer.  Neurological:     Mental Status: He is alert and oriented to person, place, and time.     Comments: Power 4+ out of 5 bilateral upper and lower extremities.     Data Reviewed: No new data today   Disposition: Status is: Inpatient Remains inpatient appropriate because: This facility will be able to take him on Monday  Planned Discharge Destination: Rehab    Time spent: 28 minutes  Author: Alford Highland, MD 04/14/2023 4:12 PM  For on call review [email or web address].

## 2023-04-15 DIAGNOSIS — L03119 Cellulitis of unspecified part of limb: Secondary | ICD-10-CM | POA: Diagnosis not present

## 2023-04-15 DIAGNOSIS — R609 Edema, unspecified: Secondary | ICD-10-CM | POA: Diagnosis not present

## 2023-04-15 DIAGNOSIS — R41841 Cognitive communication deficit: Secondary | ICD-10-CM | POA: Diagnosis not present

## 2023-04-15 DIAGNOSIS — L03115 Cellulitis of right lower limb: Secondary | ICD-10-CM | POA: Diagnosis not present

## 2023-04-15 DIAGNOSIS — I10 Essential (primary) hypertension: Secondary | ICD-10-CM | POA: Diagnosis not present

## 2023-04-15 DIAGNOSIS — I2510 Atherosclerotic heart disease of native coronary artery without angina pectoris: Secondary | ICD-10-CM | POA: Diagnosis not present

## 2023-04-15 DIAGNOSIS — I951 Orthostatic hypotension: Secondary | ICD-10-CM | POA: Diagnosis not present

## 2023-04-15 DIAGNOSIS — I63422 Cerebral infarction due to embolism of left anterior cerebral artery: Secondary | ICD-10-CM | POA: Diagnosis not present

## 2023-04-15 DIAGNOSIS — M6281 Muscle weakness (generalized): Secondary | ICD-10-CM | POA: Diagnosis not present

## 2023-04-15 DIAGNOSIS — R279 Unspecified lack of coordination: Secondary | ICD-10-CM | POA: Diagnosis not present

## 2023-04-15 DIAGNOSIS — N183 Chronic kidney disease, stage 3 unspecified: Secondary | ICD-10-CM | POA: Diagnosis not present

## 2023-04-15 DIAGNOSIS — I69320 Aphasia following cerebral infarction: Secondary | ICD-10-CM | POA: Diagnosis not present

## 2023-04-15 DIAGNOSIS — R600 Localized edema: Secondary | ICD-10-CM | POA: Diagnosis not present

## 2023-04-15 DIAGNOSIS — N179 Acute kidney failure, unspecified: Secondary | ICD-10-CM | POA: Diagnosis not present

## 2023-04-15 DIAGNOSIS — R2689 Other abnormalities of gait and mobility: Secondary | ICD-10-CM | POA: Diagnosis not present

## 2023-04-15 DIAGNOSIS — I480 Paroxysmal atrial fibrillation: Secondary | ICD-10-CM | POA: Diagnosis not present

## 2023-04-15 DIAGNOSIS — E663 Overweight: Secondary | ICD-10-CM | POA: Diagnosis not present

## 2023-04-15 DIAGNOSIS — R531 Weakness: Secondary | ICD-10-CM | POA: Diagnosis not present

## 2023-04-15 LAB — BASIC METABOLIC PANEL
Anion gap: 7 (ref 5–15)
BUN: 40 mg/dL — ABNORMAL HIGH (ref 8–23)
CO2: 29 mmol/L (ref 22–32)
Calcium: 8.9 mg/dL (ref 8.9–10.3)
Chloride: 101 mmol/L (ref 98–111)
Creatinine, Ser: 1.34 mg/dL — ABNORMAL HIGH (ref 0.61–1.24)
GFR, Estimated: 50 mL/min — ABNORMAL LOW (ref >60)
Glucose, Bld: 112 mg/dL — ABNORMAL HIGH (ref 70–99)
Potassium: 4.2 mmol/L (ref 3.5–5.1)
Sodium: 137 mmol/L (ref 135–145)

## 2023-04-15 MED ORDER — POLYETHYLENE GLYCOL 3350 17 G PO PACK: 17.0000 g | PACK | Freq: Every day | ORAL | 0 refills | Status: DC | PRN

## 2023-04-15 MED ORDER — ATORVASTATIN CALCIUM 40 MG PO TABS: 40.0000 mg | ORAL_TABLET | Freq: Every day | ORAL | 0 refills | Status: DC

## 2023-04-15 MED ORDER — VERAPAMIL HCL ER 180 MG PO TBCR: 360.0000 mg | EXTENDED_RELEASE_TABLET | Freq: Every day | ORAL | 0 refills | Status: DC

## 2023-04-15 MED ORDER — ACETAMINOPHEN 500 MG PO TABS: 1000.0000 mg | ORAL_TABLET | Freq: Four times a day (QID) | ORAL | 0 refills | Status: DC | PRN

## 2023-04-15 MED ORDER — APIXABAN 5 MG PO TABS: 5.0000 mg | ORAL_TABLET | Freq: Two times a day (BID) | ORAL | 0 refills | Status: DC

## 2023-04-15 NOTE — Progress Notes (Signed)
Physical Therapy Treatment Patient Details Name: Ryan Pace MRN: [id number] DOB: 06-25-1929 Today's Date: 04/15/2023   History of Present Illness Pt is a 89 y.o. male presenting to hospital 04/09/23 with c/o fall (back into his chair) and generalized weakness (acute onset).  Pt admitted with generalized weakness, orthostatic hypotension, cellulitis, B LE edema.  PMH includes HLD, anemia, a-fib, kidney stones, a-fib, L TKA, B dupuytren contracture release. 8/15  Overnight code stroke was called.  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  Patient was started on aspirin and Eliquis held.  Neurology consultation.    PT Comments  Pt in bed, generally uncomfortable and ready to get up.  He does initiate transition but needs assist for RLE and for upper body.  Stands from elevated bed with mod a x 1.  He is able to progress gait 140' today with slow decreased step height and length.  Slight right lean noted that pt is aware of.  He is cued to turn by Clinical research associate as he overestimates his endurance and while he is able to make it back to his room he does endorse significant fatigue.  Son arrives at end of session.  Overall progressing well but will benefit from continued therapies at discharge.   If plan is discharge home, recommend the following: A little help with walking and/or transfers;A little help with bathing/dressing/bathroom;Assistance with cooking/housework;Help with stairs or ramp for entrance;Assist for transportation   Can travel by private vehicle        Equipment Recommendations  Rolling walker (2 wheels);BSC/3in1    Recommendations for Other Services       Precautions / Restrictions Precautions Precautions: Fall Restrictions Weight Bearing Restrictions: No     Mobility  Bed Mobility Overal bed mobility: Needs Assistance Bed Mobility: Supine to Sit     Supine to sit: Mod assist, HOB elevated           Transfers Overall transfer level: Needs assistance Equipment used: Rolling walker (2 wheels) Transfers: Sit to/from Stand Sit to Stand: Mod assist, From elevated surface                Ambulation/Gait Ambulation/Gait assistance: Min assist Gait Distance (Feet): 140 Feet Assistive device: Rolling walker (2 wheels) Gait Pattern/deviations: Step-through pattern, Decreased step length - right, Decreased step length - left, Trunk flexed, Wide base of support Gait velocity: decreased         Stairs             Wheelchair Mobility     Tilt Bed    Modified Rankin (Stroke Patients Only)       Balance Overall balance assessment: Needs assistance Sitting-balance support: Feet supported Sitting balance-Leahy Scale: Fair     Standing balance support: Bilateral upper extremity supported Standing balance-Leahy Scale: Poor Standing balance comment: R lateral lean, corrects with cues                            Cognition Arousal: Alert Behavior During Therapy: WFL for tasks assessed/performed Overall Cognitive Status: Within Functional Limits for tasks assessed                                          Exercises      General Comments  Pertinent Vitals/Pain Pain Assessment Pain Assessment: Faces Pain Location: general discomfort Pain Descriptors / Indicators: Sore Pain Intervention(s): Monitored during session, Repositioned    Home Living                          Prior Function            PT Goals (current goals can now be found in the care plan section) Progress towards PT goals: Progressing toward goals    Frequency    7X/week      PT Plan      Co-evaluation              AM-PAC PT "6 Clicks" Mobility   Outcome Measure  Help needed turning from your back to your side while in a flat bed without using bedrails?: A Little Help needed moving from lying on your back to sitting on  the side of a flat bed without using bedrails?: A Lot Help needed moving to and from a bed to a chair (including a wheelchair)?: A Lot Help needed standing up from a chair using your arms (e.g., wheelchair or bedside chair)?: A Lot Help needed to walk in hospital room?: A Little Help needed climbing 3-5 steps with a railing? : A Little 6 Click Score: 15    End of Session Equipment Utilized During Treatment: Gait belt Activity Tolerance: Patient tolerated treatment well;Patient limited by fatigue Patient left: in chair;with call bell/phone within reach;with chair alarm set;with family/visitor present Nurse Communication: Mobility status PT Visit Diagnosis: Difficulty in walking, not elsewhere classified (R26.2);Muscle weakness (generalized) (M62.81)     Time: 1610-9604 PT Time Calculation (min) (ACUTE ONLY): 12 min  Charges:    $Gait Training: 8-22 mins PT General Charges $$ ACUTE PT VISIT: 1 Visit                   Danielle Dess, PTA 04/15/23, 9:34 AM

## 2023-04-15 NOTE — Plan of Care (Signed)
  Problem: Education: °Goal: Knowledge of General Education information will improve °Description: Including pain rating scale, medication(s)/side effects and non-pharmacologic comfort measures °Outcome: Progressing °  °Problem: Clinical Measurements: °Goal: Diagnostic test results will improve °Outcome: Progressing °  °Problem: Activity: °Goal: Risk for activity intolerance will decrease °Outcome: Progressing °  °Problem: Coping: °Goal: Level of anxiety will decrease °Outcome: Progressing °  °

## 2023-04-15 NOTE — Care Management Important Message (Signed)
Important Message  Patient Details  Name: FONZO COMBEST MRN: [id number] Date of Birth: [birth year]   Medicare Important Message Given:  Yes     Olegario Messier A Keiran Sias 04/15/2023, 9:42 AM

## 2023-04-15 NOTE — TOC Transition Note (Addendum)
Transition of Care [facility]) - CM/SW Discharge Note   Patient Details  Name: Ryan Pace MRN: [id number] Date of Birth: [birth year]  [facility] CM/SW Contact:  Allena Katz, LCSW Phone Number: 04/15/2023, 10:17 AM   Clinical Narrative:   CSW spoke with son to inform him Berkley Harvey is pending. Son states he will private pay. Kim at [facility] notified and can see that dc summary is in. Son will transport. RN given number for report.           Patient Goals and CMS Choice      Discharge Placement                         Discharge Plan and Services Additional resources added to the After Visit Summary for                                       Social Determinants of Health (SDOH) Interventions SDOH Screenings   Food Insecurity: Patient Declined (04/09/2023)  Housing: Patient Declined (04/09/2023)  Tobacco Use: Medium Risk (04/09/2023)     Readmission Risk Interventions     No data to display

## 2023-04-15 NOTE — Discharge Summary (Signed)
Physician Discharge Summary   Patient: Ryan Pace MRN: [id number] DOB: 12/05/1928  Admit date:     04/09/2023  Discharge date: 04/15/23  Discharge Physician: Alford Highland   PCP: Barbette Reichmann, MD   Recommendations at discharge:    Follow up with team at rehab Follow up Dr Marcello Fennel once out of rehab  Discharge Diagnoses: Principal Problem:   Embolic stroke involving left anterior cerebral artery (HCC) Active Problems:   Orthostatic hypotension   Generalized weakness   Cellulitis   Bilateral lower extremity edema   Acute kidney injury superimposed on CKD (HCC)   Paroxysmal A-fib (HCC)   Essential hypertension   Overweight (BMI 25.0-29.9)   Skin lesion   Hospital Course: 89 y.o. male with medical history significant of atrial fibrillation on Eliquis, chronic kidney disease stage IIIb, hypertension, hyperlipidemia, nephrolithiasis, anemia, who presents to the ED due to weakness.   Mr. Ryan Pace states that beginning today, he noticed that when he sat on the recliner and went to go stand up, he was unable to do so.  He has particular weakness in his bilateral lower extremities, but feels that his bilateral upper extremities also feel weaker than usual.  He notes difficult workout session yesterday with physical therapy and believes he did not hydrate very well over the last 24 hours.  Otherwise he denies any acute symptoms, including dizziness, headache, chest pain, shortness of breath, palpitations, dysuria, nausea, vomiting, abdominal pain, or localized weakness.   ED course: On arrival to the ED, patient was hypertensive at 162/89 with heart rate of 75.  He was saturating at 95% on room air.  He was afebrile at 98.2.  Workup demonstrates platelets 135, glucose 102, BUN 30, creatinine 1.54 with GFR 42.  Urinalysis with no leukocytes, nitrites.  Troponin at 22 within patient's baseline.  Due to concern for cellulitis, Rocephin was started.  Due to generalized weakness with  positive orthostatics, [facility] contacted for admission.  8/14.  Patient with orthostatic hypotension.  Will order TED hose and gentle IV fluids.  Holding all blood pressure medications except for Cardizem CD.  Would rather have blood pressure on the higher side than to low. 8/15.  Overnight code stroke was called.  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  Patient was started on aspirin and Eliquis held.  Neurology consultation. 8/16.  Patient doing better with physical therapy.  Intermittent trouble with his words.  Strength has improved.  Neurology gave the clearance to go back on Eliquis.  He is a candidate for 5 mg twice a day. 8/17.  Patient having more difficulty with articulation.  Noticed it more this morning.  Stat CT scan of the head negative for hemorrhage.  No visible progression of patient's acute infarct. 8/18.  Patient still having issues with articulation. 8/19.  Stable for discharge out to rehab.  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  Lipitor 40 mg daily.  Stat CT scan of the head done on 8/17 showed a stable exam without hemorrhage.  Patient will go out to rehab on Monday.  Patient still having difficulty with articulation but a little bit better today.  Orthostatic hypotension TED hose.  Holding lisinopril.  Continue Calan SR.  Check orthostatics before adding BP meds.  Generalized weakness Likely secondary to acute stroke and orthostatic hypotension.  Continue working with PT  and OT.  Cellulitis Right lower extremity.  Completed antibiotics.  Bilateral lower extremity edema TED hose.   Acute kidney injury superimposed on CKD (HCC) Acute kidney injury on CKD stage IIIb.  Creatinine improved from 1.54 down to 1.18 with IV fluid hydration.  Today's creatinine 1.34 with a gfr of 50.  Paroxysmal  A-fib (HCC) Continue verapamil at increased dose.  Restarted Eliquis on 8/16 at 5 mg twice daily.  Essential hypertension Continue Calan and hold lisinopril.  Skin lesion Behind right ear.  Likely skin cancer.  Recommend dermatology follow-up as outpatient.  Overweight (BMI 25.0-29.9) BMI listed as 29.53.         Consultants: Neurology Procedures performed: none Disposition: Rehabilitation facility Diet recommendation:  Cardiac diet DISCHARGE MEDICATION: Allergies as of 04/15/2023       Reactions   Penicillins Rash           Medication List     STOP taking these medications    furosemide 20 MG tablet Commonly known as: LASIX   lisinopril 20 MG tablet Commonly known as: ZESTRIL   metoprolol tartrate 25 MG tablet Commonly known as: LOPRESSOR   zolpidem 5 MG tablet Commonly known as: AMBIEN       TAKE these medications    acetaminophen 500 MG tablet Commonly known as: TYLENOL Take 2 tablets (1,000 mg total) by mouth every 6 (six) hours as needed for mild pain (or Fever >/= 101).   apixaban 5 MG Tabs tablet Commonly known as: ELIQUIS Take 1 tablet (5 mg total) by mouth 2 (two) times daily. What changed:  medication strength how much to take   atorvastatin 40 MG tablet Commonly known as: LIPITOR Take 1 tablet (40 mg total) by mouth daily.   cetirizine 5 MG tablet Commonly known as: ZYRTEC Take 10 mg by mouth daily.   cholecalciferol 1000 units tablet Commonly known as: VITAMIN D Take 1,000 Units by mouth daily.   ferrous sulfate 325 (65 FE) MG EC tablet Take 325 mg by mouth daily.   polyethylene glycol 17 g packet Commonly known as: MIRALAX / GLYCOLAX Take 17 g by mouth daily as needed for mild constipation.   sertraline 50 MG tablet Commonly known as: ZOLOFT Take 50 mg by mouth daily.   traZODone 50 MG tablet Commonly known as: DESYREL Take 100 mg by mouth at bedtime.   verapamil 180 MG CR tablet Commonly known as:  CALAN-SR Take 2 tablets (360 mg total) by mouth at bedtime. What changed:  medication strength how much to take        Follow-up Information     Hande, Vishwanath, MD Follow up in 1 week(s).   Specialty: Internal Medicine Why: once out of rehab Contact information: [address] [place] [address] [phone]         Dr at Rehab Follow up.                 Discharge Exam: Filed Weights   04/09/23 1329  Weight: 90.7 kg   Physical Exam HENT:     Head: Normocephalic.     Mouth/Throat:     Pharynx: No oropharyngeal exudate.  Eyes:     General: Lids are normal.     Conjunctiva/sclera: Conjunctivae normal.  Cardiovascular:     Rate and Rhythm: Normal rate and regular rhythm.     Heart sounds: Normal heart sounds, S1 normal and S2 normal.  Pulmonary:     Breath sounds: No decreased breath  sounds, wheezing, rhonchi or rales.  Abdominal:     Palpations: Abdomen is soft.     Tenderness: There is no abdominal tenderness.  Musculoskeletal:     Right lower leg: Swelling present.     Left lower leg: Swelling present.     Comments: Good range of motion both shoulders without pain to palpation.  Skin:    General: Skin is warm.     Findings: No rash.     Comments: Behind right ear 2 areas of growth looks like a skin cancer.  Neurological:     Mental Status: He is alert and oriented to person, place, and time.     Comments: Power 4+ out of 5 bilateral upper and lower extremities.      Condition at discharge: stable  The results of significant diagnostics from this hospitalization (including imaging, microbiology, ancillary and laboratory) are listed below for reference.   Imaging Studies: CT HEAD WO CONTRAST ( )  Result Date: 04/13/2023 CLINICAL DATA:  Stroke follow-up, rule out bleeding EXAM: CT HEAD WITHOUT CONTRAST TECHNIQUE: Contiguous axial images were obtained from the base of the skull through the vertex without  intravenous contrast. RADIATION DOSE REDUCTION: This exam was performed according to the departmental dose-optimization program which includes automated exposure control, adjustment of the mA and/or kV according to patient size and/or use of iterative reconstruction technique. COMPARISON:  MRI from 2 days ago FINDINGS: Brain: Underestimated acute infarct in the left ACA distribution when compared to prior brain MRI. No hemorrhage, hydrocephalus, mass, collection, or visible progression. Chronic small vessel disease and atrophy Vascular: No hyperdense vessel or unexpected calcification. Skull: Normal. Negative for fracture or focal lesion. Sinuses/Orbits: No acute finding. IMPRESSION: Stable exam. No hemorrhage or visible progression of the patient's acute infarct. Electronically Signed   By: Tiburcio Pea M.D.   On: 04/13/2023 11:00   ECHOCARDIOGRAM COMPLETE BUBBLE STUDY  Result Date: 04/11/2023    ECHOCARDIOGRAM REPORT   Patient Name:   DERIAN BOZELL Landsberg Date of Exam: 04/11/2023 Medical Rec #:  [id number]        Height:       69.0 in Accession #:    [id number]       Weight:       200.0 lb Date of Birth:  [birth year]        BSA:          2.066 m Patient Age:    89 years         BP:           165/101 mmHg Patient Gender: M                HR:           85 bpm. Exam Location:  [facility] Procedure: 2D Echo, Cardiac Doppler, Color Doppler and Saline Contrast Bubble            Study Indications:     Stroke 434.91 / I63.9  History:         Patient has no prior history of Echocardiogram examinations.                  Risk Factors:Dyslipidemia.  Sonographer:     Cristela Blue Referring Phys:  [id number] BRENDA MORRISON Diagnosing Phys: Julien Nordmann MD IMPRESSIONS  1. Left ventricular ejection fraction, by estimation, is 60 to 65%. The left ventricle has normal function. The left ventricle has no regional wall motion abnormalities. There is moderate left ventricular hypertrophy. Left ventricular diastolic parameters  are consistent  with Grade I diastolic dysfunction (impaired relaxation).  2. Right ventricular systolic function is normal. The right ventricular size is normal. There is normal pulmonary artery systolic pressure. The estimated right ventricular systolic pressure is 17.2 mmHg.  3. The mitral valve is normal in structure. No evidence of mitral valve regurgitation. No evidence of mitral stenosis.  4. The aortic valve is normal in structure. Aortic valve regurgitation is not visualized. No aortic stenosis is present.  5. The inferior vena cava is normal in size with greater than 50% respiratory variability, suggesting right atrial pressure of 3 mmHg.  6. Agitated saline contrast bubble study was negative, with no evidence of any interatrial shunt. FINDINGS  Left Ventricle: Left ventricular ejection fraction, by estimation, is 60 to 65%. The left ventricle has normal function. The left ventricle has no regional wall motion abnormalities. The left ventricular internal cavity size was normal in size. There is  moderate left ventricular hypertrophy. Left ventricular diastolic parameters are consistent with Grade I diastolic dysfunction (impaired relaxation). Right Ventricle: The right ventricular size is normal. No increase in right ventricular wall thickness. Right ventricular systolic function is normal. There is normal pulmonary artery systolic pressure. The tricuspid regurgitant velocity is 1.75 m/s, and  with an assumed right atrial pressure of 5 mmHg, the estimated right ventricular systolic pressure is 17.2 mmHg. Left Atrium: Left atrial size was normal in size. Right Atrium: Right atrial size was normal in size. Pericardium: There is no evidence of pericardial effusion. Mitral Valve: The mitral valve is normal in structure. No evidence of mitral valve regurgitation. No evidence of mitral valve stenosis. MV peak gradient, 3.6 mmHg. The mean mitral valve gradient is 1.0 mmHg. Tricuspid Valve: The tricuspid valve is normal in  structure. Tricuspid valve regurgitation is not demonstrated. No evidence of tricuspid stenosis. Aortic Valve: The aortic valve is normal in structure. Aortic valve regurgitation is not visualized. No aortic stenosis is present. Aortic valve mean gradient measures 3.5 mmHg. Aortic valve peak gradient measures 5.2 mmHg. Aortic valve area, by VTI measures 3.38 cm. Pulmonic Valve: The pulmonic valve was normal in structure. Pulmonic valve regurgitation is not visualized. No evidence of pulmonic stenosis. Aorta: The aortic root is normal in size and structure. Venous: The inferior vena cava is normal in size with greater than 50% respiratory variability, suggesting right atrial pressure of 3 mmHg. IAS/Shunts: No atrial level shunt detected by color flow Doppler. Agitated saline contrast was given intravenously to evaluate for intracardiac shunting. Agitated saline contrast bubble study was negative, with no evidence of any interatrial shunt.  LEFT VENTRICLE PLAX 2D LVIDd:         3.80 cm   Diastology LVIDs:         2.60 cm   LV e' medial:    7.29 cm/s LV PW:         1.30 cm   LV E/e' medial:  8.4 LV IVS:        1.80 cm   LV e' lateral:   9.03 cm/s LVOT diam:     2.00 cm   LV E/e' lateral: 6.8 LV SV:         62 LV SV Index:   30 LVOT Area:     3.14 cm  RIGHT VENTRICLE RV Basal diam:  3.30 cm RV Mid diam:    3.30 cm RV S prime:     19.90 cm/s TAPSE (M-mode): 2.8 cm LEFT ATRIUM  Index        RIGHT ATRIUM           Index LA diam:        4.00 cm 1.94 cm/m   RA Area:     14.50 cm LA Vol (A2C):   39.1 ml 18.93 ml/m  RA Volume:   33.80 ml  16.36 ml/m LA Vol (A4C):   27.8 ml 13.46 ml/m LA Biplane Vol: 34.1 ml 16.51 ml/m  AORTIC VALVE AV Area (Vmax):    2.78 cm AV Area (Vmean):   2.75 cm AV Area (VTI):     3.38 cm AV Vmax:           114.00 cm/s AV Vmean:          84.150 cm/s AV VTI:            0.184 m AV Peak Grad:      5.2 mmHg AV Mean Grad:      3.5 mmHg LVOT Vmax:         101.00 cm/s LVOT Vmean:         73.700 cm/s LVOT VTI:          0.198 m LVOT/AV VTI ratio: 1.08  AORTA Ao Root diam: 3.70 cm MITRAL VALVE               TRICUSPID VALVE MV Area (PHT): 4.33 cm    TR Peak grad:   12.2 mmHg MV Area VTI:   3.34 cm    TR Vmax:        175.00 cm/s MV Peak grad:  3.6 mmHg MV Mean grad:  1.0 mmHg    SHUNTS MV Vmax:       0.95 m/s    Systemic VTI:  0.20 m MV Vmean:      52.0 cm/s   Systemic Diam: 2.00 cm MV Decel Time: 175 msec MV E velocity: 61.30 cm/s MV A velocity: 95.90 cm/s MV E/A ratio:  0.64 Julien Nordmann MD Electronically signed by Julien Nordmann MD Signature Date/Time: 04/11/2023/3:44:29 PM    Final    US Venous Img Lower Bilateral (DVT)  Result Date: 04/11/2023 CLINICAL DATA:  [id number] Stroke (HCC) [id number] EXAM: BILATERAL LOWER EXTREMITY VENOUS DOPPLER ULTRASOUND TECHNIQUE: Gray-scale sonography with graded compression, as well as color Doppler and duplex ultrasound were performed to evaluate the lower extremity deep venous systems from the level of the common femoral vein and including the common femoral, femoral, profunda femoral, popliteal and calf veins including the posterior tibial, peroneal and gastrocnemius veins when visible. The superficial great saphenous vein was also interrogated. Spectral Doppler was utilized to evaluate flow at rest and with distal augmentation maneuvers in the common femoral, femoral and popliteal veins. COMPARISON:  CT AP, 09/02/2022. FINDINGS: RIGHT LOWER EXTREMITY VENOUS Normal compressibility of the RIGHT common femoral, superficial femoral, and popliteal veins, as well as the visualized calf veins. Visualized portions of profunda femoral vein and great saphenous vein unremarkable. No filling defects to suggest DVT on grayscale or color Doppler imaging. Doppler waveforms show normal direction of venous flow, normal respiratory plasticity and response to augmentation. OTHER No evidence of superficial thrombophlebitis or abnormal fluid collection. Limitations: none LEFT LOWER  EXTREMITY VENOUS Normal compressibility of the LEFT common femoral, superficial femoral, and popliteal veins, as well as the visualized calf veins. Visualized portions of profunda femoral vein and great saphenous vein unremarkable. No filling defects to suggest DVT on grayscale or color Doppler imaging. Doppler waveforms show normal direction of venous  flow, normal respiratory plasticity and response to augmentation. OTHER No evidence of superficial thrombophlebitis or abnormal fluid collection. Limitations: none IMPRESSION: No evidence of femoropopliteal DVT or superficial thrombophlebitis within either lower extremity. Roanna Banning, MD [facility] Electronically Signed   By: Roanna Banning M.D.   On: 04/11/2023 11:24   MR BRAIN WO CONTRAST  Result Date: 04/11/2023 CLINICAL DATA:  Slurred speech.  Left-sided facial droop EXAM: MRI HEAD WITHOUT CONTRAST MRA HEAD WITHOUT CONTRAST MRA NECK WITHOUT CONTRAST TECHNIQUE: Multiplanar, multiecho pulse sequences of the brain and surrounding structures were obtained without intravenous contrast. Angiographic images of the Circle of Willis were obtained using MRA technique without intravenous contrast. Angiographic images of the neck were obtained using MRA technique without intravenous contrast. Carotid stenosis measurements (when applicable) are obtained utilizing NASCET criteria, using the distal internal carotid diameter as the denominator. COMPARISON:  None Available. FINDINGS: MRI HEAD FINDINGS Brain: Cluster of small fissure at clustered small areas of restricted diffusion in the left corpus callosum and adjacent cingulate white matter. No hemorrhage, hydrocephalus, or collection. Chronic small vessel ischemia in the cerebral white matter. Chronic perforator infarct in the left corona radiata. Chronic microhemorrhages in the left cerebellum and central pons. Mild cerebral volume loss. Vascular: Arterial  findings below.  Normal venous flow voids. Skull and upper cervical spine: No focal marrow lesion. C2-3 incomplete segmentation. Sinuses/Orbits: Fluid level in the right maxillary sinus but no superimposed mucosal thickening. Essentially negative orbits. MRA HEAD FINDINGS Limited signal in the proximal V4 segments which is considered technical. The left vertebral artery is strongly dominant. The carotid, vertebral, and basilar arteries are patent where visible. There is atheromatous irregularity of intracranial branches with high-grade left A2 and left P1/2 stenosis. Trace flow is seen at the right A1 segment, likely developmental. Moderate atheromatous narrowing of the proximal right PCA. MRA NECK FINDINGS Normal arch with 3 vessel branching where covered.Atheromatous wall thickening is likely especially at the bifurcation but no evidence of stenosis or ulceration involving the cervical carotids. No evidence of proximal subclavian stenosis. The left vertebral artery is strongly dominant. When allowing for flow artifact in the distal vertebral arteries no stenosis or beading detected. IMPRESSION: Brain MRI: 1. Cluster of small acute infarcts in the left corpus callosum body and cingulate white matter, left ACA distribution. 2. Chronic small vessel ischemia to a moderate degree. MRA head: 1. No emergent finding. 2. Atherosclerosis with advanced left A2 and left PCA narrowings. MRA neck: No emergent finding and limited atheromatous changes for age without significant stenosis. Electronically Signed   By: Tiburcio Pea M.D.   On: 04/11/2023 05:09   MR ANGIO HEAD WO CONTRAST  Result Date: 04/11/2023 CLINICAL DATA:  Slurred speech.  Left-sided facial droop EXAM: MRI HEAD WITHOUT CONTRAST MRA HEAD WITHOUT CONTRAST MRA NECK WITHOUT CONTRAST TECHNIQUE: Multiplanar, multiecho pulse sequences of the brain and surrounding structures were obtained without intravenous contrast. Angiographic images of the Circle of Willis  were obtained using MRA technique without intravenous contrast. Angiographic images of the neck were obtained using MRA technique without intravenous contrast. Carotid stenosis measurements (when applicable) are obtained utilizing NASCET criteria, using the distal internal carotid diameter as the denominator. COMPARISON:  None Available. FINDINGS: MRI HEAD FINDINGS Brain: Cluster of small fissure at clustered small areas of restricted diffusion in the left corpus callosum and adjacent cingulate white matter. No hemorrhage, hydrocephalus, or collection. Chronic small vessel ischemia in the cerebral white matter. Chronic perforator infarct in the left  corona radiata. Chronic microhemorrhages in the left cerebellum and central pons. Mild cerebral volume loss. Vascular: Arterial findings below.  Normal venous flow voids. Skull and upper cervical spine: No focal marrow lesion. C2-3 incomplete segmentation. Sinuses/Orbits: Fluid level in the right maxillary sinus but no superimposed mucosal thickening. Essentially negative orbits. MRA HEAD FINDINGS Limited signal in the proximal V4 segments which is considered technical. The left vertebral artery is strongly dominant. The carotid, vertebral, and basilar arteries are patent where visible. There is atheromatous irregularity of intracranial branches with high-grade left A2 and left P1/2 stenosis. Trace flow is seen at the right A1 segment, likely developmental. Moderate atheromatous narrowing of the proximal right PCA. MRA NECK FINDINGS Normal arch with 3 vessel branching where covered.Atheromatous wall thickening is likely especially at the bifurcation but no evidence of stenosis or ulceration involving the cervical carotids. No evidence of proximal subclavian stenosis. The left vertebral artery is strongly dominant. When allowing for flow artifact in the distal vertebral arteries no stenosis or beading detected. IMPRESSION: Brain MRI: 1. Cluster of small acute infarcts in  the left corpus callosum body and cingulate white matter, left ACA distribution. 2. Chronic small vessel ischemia to a moderate degree. MRA head: 1. No emergent finding. 2. Atherosclerosis with advanced left A2 and left PCA narrowings. MRA neck: No emergent finding and limited atheromatous changes for age without significant stenosis. Electronically Signed   By: Tiburcio Pea M.D.   On: 04/11/2023 05:09   MR ANGIO NECK WO CONTRAST  Result Date: 04/11/2023 CLINICAL DATA:  Slurred speech.  Left-sided facial droop EXAM: MRI HEAD WITHOUT CONTRAST MRA HEAD WITHOUT CONTRAST MRA NECK WITHOUT CONTRAST TECHNIQUE: Multiplanar, multiecho pulse sequences of the brain and surrounding structures were obtained without intravenous contrast. Angiographic images of the Circle of Willis were obtained using MRA technique without intravenous contrast. Angiographic images of the neck were obtained using MRA technique without intravenous contrast. Carotid stenosis measurements (when applicable) are obtained utilizing NASCET criteria, using the distal internal carotid diameter as the denominator. COMPARISON:  None Available. FINDINGS: MRI HEAD FINDINGS Brain: Cluster of small fissure at clustered small areas of restricted diffusion in the left corpus callosum and adjacent cingulate white matter. No hemorrhage, hydrocephalus, or collection. Chronic small vessel ischemia in the cerebral white matter. Chronic perforator infarct in the left corona radiata. Chronic microhemorrhages in the left cerebellum and central pons. Mild cerebral volume loss. Vascular: Arterial findings below.  Normal venous flow voids. Skull and upper cervical spine: No focal marrow lesion. C2-3 incomplete segmentation. Sinuses/Orbits: Fluid level in the right maxillary sinus but no superimposed mucosal thickening. Essentially negative orbits. MRA HEAD FINDINGS Limited signal in the proximal V4 segments which is considered technical. The left vertebral artery is  strongly dominant. The carotid, vertebral, and basilar arteries are patent where visible. There is atheromatous irregularity of intracranial branches with high-grade left A2 and left P1/2 stenosis. Trace flow is seen at the right A1 segment, likely developmental. Moderate atheromatous narrowing of the proximal right PCA. MRA NECK FINDINGS Normal arch with 3 vessel branching where covered.Atheromatous wall thickening is likely especially at the bifurcation but no evidence of stenosis or ulceration involving the cervical carotids. No evidence of proximal subclavian stenosis. The left vertebral artery is strongly dominant. When allowing for flow artifact in the distal vertebral arteries no stenosis or beading detected. IMPRESSION: Brain MRI: 1. Cluster of small acute infarcts in the left corpus callosum body and cingulate white matter, left ACA distribution. 2. Chronic small vessel ischemia  to a moderate degree. MRA head: 1. No emergent finding. 2. Atherosclerosis with advanced left A2 and left PCA narrowings. MRA neck: No emergent finding and limited atheromatous changes for age without significant stenosis. Electronically Signed   By: Tiburcio Pea M.D.   On: 04/11/2023 05:09   CT HEAD CODE STROKE WO CONTRAST`  Result Date: 04/11/2023 CLINICAL DATA:  Code stroke. EXAM: CT HEAD WITHOUT CONTRAST TECHNIQUE: Contiguous axial images were obtained from the base of the skull through the vertex without intravenous contrast. RADIATION DOSE REDUCTION: This exam was performed according to the departmental dose-optimization program which includes automated exposure control, adjustment of the mA and/or kV according to patient size and/or use of iterative reconstruction technique. COMPARISON:  Prior study from 09/02/2022. FINDINGS: Brain: Age-related cerebral atrophy with moderate chronic microvascular ischemic disease. Remote lacunar infarct at the left basal ganglia. No acute intracranial hemorrhage. No visible acute large  vessel territory infarct. No mass lesion or midline shift. No hydrocephalus or extra-axial fluid collection. Vascular: No abnormal hyperdense vessel. Scattered calcified atherosclerosis about the skull base. Skull: Scalp soft tissues demonstrate no acute finding. Calvarium intact. Sinuses/Orbits: Globes and orbital soft tissues within normal limits. Small air-fluid level noted within the right maxillary sinus. Paranasal sinuses are otherwise largely clear. No mastoid effusion. Other: None. ASPECTS [facility] Stroke Program Early CT Score) - Ganglionic level infarction (caudate, lentiform nuclei, internal capsule, insula, M1-M3 cortex): 7 - Supraganglionic infarction (M4-M6 cortex): 3 Total score (0-10 with 10 being normal): 10 IMPRESSION: 1. No acute intracranial abnormality. 2. ASPECTS is 10. 3. Age-related cerebral atrophy with moderate chronic microvascular ischemic disease. 4. Air-fluid level within the right maxillary sinus. Clinical correlation for possible acute sinusitis recommended. These results were communicated to Manuela Schwartz at 12:14 am on 04/11/2023 by text page via the [facility] messaging system. Electronically Signed   By: Rise Mu M.D.   On: 04/11/2023 00:15    Microbiology: Results for orders placed or performed during the hospital encounter of 04/09/23  SARS Coronavirus 2 by RT PCR (hospital order, performed in [facility] hospital lab) *cepheid single result test* Anterior Nasal Swab     Status: None   Collection Time: 04/09/23  2:25 PM   Specimen: Anterior Nasal Swab  Result Value Ref Range Status   SARS Coronavirus 2 by RT PCR NEGATIVE NEGATIVE Final    Comment: (NOTE) SARS-CoV-2 target nucleic acids are NOT DETECTED.  The SARS-CoV-2 RNA is generally detectable in upper and lower respiratory specimens during the acute phase of infection. The lowest concentration of SARS-CoV-2 viral copies this assay can detect is 250 copies / mL. A negative result does not preclude  SARS-CoV-2 infection and should not be used as the sole basis for treatment or other patient management decisions.  A negative result may occur with improper specimen collection / handling, submission of specimen other than nasopharyngeal swab, presence of viral mutation(s) within the areas targeted by this assay, and inadequate number of viral copies (<250 copies / mL). A negative result must be combined with clinical observations, patient history, and epidemiological information.  Fact Sheet for Patients:   RoadLapTop.co.za  Fact Sheet for Healthcare Providers: http://kim-miller.com/  This test is not yet approved or  cleared by the [place] FDA and has been authorized for detection and/or diagnosis of SARS-CoV-2 by FDA under an Emergency Use Authorization (EUA).  This EUA will remain in effect (meaning this test can be used) for the duration of the COVID-19 declaration under Section 564(b)(1) of the Act,  21 U.S.C. section 360bbb-3(b)(1), unless the authorization is terminated or revoked sooner.  Performed at [facility], [address] [place], [place] [address]     Labs: CBC: Recent Labs  Lab 04/09/23 1346 04/11/23 0501 04/13/23 0436  WBC 5.9 8.0 7.0  NEUTROABS  --  5.8  --   HGB 13.1 13.9 13.6  HCT 39.9 42.1 40.5  MCV 91.3 90.7 91.2  PLT 135* 138* 136*   Basic Metabolic Panel: Recent Labs  Lab 04/09/23 1346 04/10/23 0402 04/11/23 0158 04/11/23 0501 04/13/23 0436 04/15/23 0340  NA 135 137  --  136 139 137  K 3.9 3.9  --  3.8 4.0 4.2  CL 100 104  --  103 107 101  CO2 29 27  --  25 27 29   GLUCOSE 102* 86  --  100* 92 112*  BUN 30* 28*  --  23 32* 40*  CREATININE 1.54* 1.37*  --  1.18 1.27* 1.34*  CALCIUM 8.5* 8.4*  --  8.6* 8.8* 8.9  MG  --   --  1.9  --   --   --   PHOS  --   --  2.7  --   --   --     CBG: Recent Labs  Lab 04/10/23 2353 04/11/23 0113  GLUCAP 111* 108*    Discharge  time spent: greater than 30 minutes.  Signed: Alford Highland, MD [facility] 04/15/2023

## 2023-04-15 NOTE — NC FL2 (Signed)
[place] MEDICAID FL2 LEVEL OF CARE FORM     IDENTIFICATION  Patient Name: Ryan Pace Birthdate: June 30, 1929 Sex: male Admission Date (Current Location): 04/09/2023  [facility] and [place] Number:  Chiropodist and Address:  [facility], [address], [place], [place] [address]      Provider Number: [id number]  Attending Physician Name and Address:  Alford Highland, MD  Relative Name and Phone Number:       Current Level of Care: Hospital Recommended Level of Care: Skilled Nursing Facility Prior Approval Number:    Date Approved/Denied:   PASRR Number: [id number]  Discharge Plan: SNF    Current Diagnoses: Patient Active Problem List   Diagnosis Date Noted   Embolic stroke involving left anterior cerebral artery (HCC) 04/11/2023   Acute kidney injury superimposed on CKD (HCC) 04/11/2023   Overweight (BMI 25.0-29.9) 04/11/2023   Skin lesion 04/11/2023   Generalized weakness 04/09/2023   Orthostatic hypotension 04/09/2023   Cellulitis 04/09/2023   Bilateral lower extremity edema 04/09/2023   Obstructive sleep apnea 04/18/2020   S/P TKR (total knee replacement) using cement, left 10/29/2018   Essential hypertension 10/08/2018   Osteoarthritis of left knee 09/19/2018   Hematoma 06/08/2018   Seborrheic dermatitis 05/28/2018   Dupuytren's contracture 12/12/2015   Insomnia with sleep apnea 10/12/2015   Hyperlipidemia, mixed 03/09/2015   Moderate mitral insufficiency 09/06/2014   Paroxysmal A-fib (HCC) 09/06/2014   CAD (coronary artery disease), native coronary artery 02/05/2014    Orientation RESPIRATION BLADDER Height & Weight     Self, Time, Situation, Place  Normal Continent Weight: 200 lb (90.7 kg) Height:  5\' 9"  (175.3 cm)  BEHAVIORAL SYMPTOMS/MOOD NEUROLOGICAL BOWEL NUTRITION STATUS      Continent    AMBULATORY STATUS COMMUNICATION OF NEEDS Skin   Limited Assist Verbally                         Personal Care  Assistance Level of Assistance              Functional Limitations Info  Sight, Hearing, Speech Sight Info: Adequate Hearing Info: Adequate Speech Info: Adequate    SPECIAL CARE FACTORS FREQUENCY  PT (By licensed PT), OT (By licensed OT)     PT Frequency: 5 times a week OT Frequency: 5 times a week            Contractures Contractures Info: Not present    Additional Factors Info  Code Status, Allergies Code Status Info: DNR Allergies Info: Penicillins           Current Medications (04/15/2023):  This is the current hospital active medication list Current Facility-Administered Medications  Medication Dose Route Frequency Provider Last Rate Last Admin   acetaminophen (TYLENOL) tablet 1,000 mg  1,000 mg Oral Q6H PRN Manuela Schwartz, NP   1,000 mg at 04/13/23 0105   Or   acetaminophen (TYLENOL) suppository 650 mg  650 mg Rectal Q6H PRN Manuela Schwartz, NP       apixaban Everlene Balls) tablet 5 mg  5 mg Oral BID Alford Highland, MD   5 mg at 04/15/23 0909   atorvastatin (LIPITOR) tablet 40 mg  40 mg Oral Daily Manuela Schwartz, NP   40 mg at 04/15/23 7846   ferrous sulfate tablet 325 mg  325 mg Oral Daily Verdene Lennert, MD   325 mg at 04/15/23 0910   loratadine (CLARITIN) tablet 10 mg  10 mg Oral Daily Verdene Lennert, MD  10 mg at 04/15/23 0910   ondansetron (ZOFRAN) tablet 4 mg  4 mg Oral Q6H PRN Verdene Lennert, MD       Or   ondansetron (ZOFRAN) injection 4 mg  4 mg Intravenous Q6H PRN Verdene Lennert, MD       polyethylene glycol (MIRALAX / GLYCOLAX) packet 17 g  17 g Oral Daily PRN Verdene Lennert, MD       sodium chloride flush (NS) 0.9 % injection 3 mL  3 mL Intravenous Q12H Verdene Lennert, MD   3 mL at 04/15/23 0910   traZODone (DESYREL) tablet 100 mg  100 mg Oral QHS Alford Highland, MD   100 mg at 04/14/23 2111   verapamil (CALAN-SR) CR tablet 360 mg  360 mg Oral QHS Alford Highland, MD   360 mg at 04/14/23 2111     Discharge Medications: Please see  discharge summary for a list of discharge medications.  Relevant Imaging Results:  Relevant Lab Results:   Additional Information SS [id number]  Allena Katz, LCSW

## 2023-04-15 NOTE — Consult Note (Signed)
[facility] Customer service manager [facility]) Accountable Care Organization (ACO) [facility] Liaison Note  04/15/2023  [name] [birth year] [id number]  Location: [facility] RN Hospital Liaison screened the patient remotely at [facility].  Insurance: [employer] HMO   Ryan Pace is a 89 y.o. male who is a Primary Care Patient of Barbette Reichmann, MD [facility]). The patient was screened for  readmission hospitalization with noted low risk score for unplanned readmission risk with 1 IP in 6 months.  The patient was assessed for potential [facility]) Care Management service needs for post hospital transition for care coordination. Review of patient's electronic medical record reveals patient was admitted with Cellulitis right leg. Pt discharged to SNF level of care and facility will continue to manage pt's needs.   [facility] Care Management/Population Health does not replace or interfere with any arrangements made by the Inpatient Transition of Care team.   For questions contact:   Elliot Cousin, RN, [facility] Liaison [place]   Population Health Office Hours MTWF  8:00 am-6:00 pm Off on Thursday [phone] mobile [phone] [Office toll free line] Office Hours are M-F 8:30 - 5 pm [name]

## 2023-04-16 DIAGNOSIS — I480 Paroxysmal atrial fibrillation: Secondary | ICD-10-CM | POA: Diagnosis not present

## 2023-05-04 DIAGNOSIS — B351 Tinea unguium: Secondary | ICD-10-CM | POA: Diagnosis not present

## 2023-05-04 DIAGNOSIS — I7091 Generalized atherosclerosis: Secondary | ICD-10-CM | POA: Diagnosis not present

## 2023-05-06 DIAGNOSIS — I5032 Chronic diastolic (congestive) heart failure: Secondary | ICD-10-CM | POA: Diagnosis not present

## 2023-05-06 DIAGNOSIS — G4700 Insomnia, unspecified: Secondary | ICD-10-CM | POA: Diagnosis not present

## 2023-05-06 DIAGNOSIS — G4730 Sleep apnea, unspecified: Secondary | ICD-10-CM | POA: Diagnosis not present

## 2023-05-07 DIAGNOSIS — L03119 Cellulitis of unspecified part of limb: Secondary | ICD-10-CM | POA: Diagnosis not present

## 2023-05-07 DIAGNOSIS — R4782 Fluency disorder in conditions classified elsewhere: Secondary | ICD-10-CM | POA: Diagnosis not present

## 2023-05-07 DIAGNOSIS — I69320 Aphasia following cerebral infarction: Secondary | ICD-10-CM | POA: Diagnosis not present

## 2023-05-07 DIAGNOSIS — R41841 Cognitive communication deficit: Secondary | ICD-10-CM | POA: Diagnosis not present

## 2023-05-07 DIAGNOSIS — I63422 Cerebral infarction due to embolism of left anterior cerebral artery: Secondary | ICD-10-CM | POA: Diagnosis not present

## 2023-05-07 DIAGNOSIS — I480 Paroxysmal atrial fibrillation: Secondary | ICD-10-CM | POA: Diagnosis not present

## 2023-05-07 DIAGNOSIS — M6281 Muscle weakness (generalized): Secondary | ICD-10-CM | POA: Diagnosis not present

## 2023-05-07 DIAGNOSIS — R2681 Unsteadiness on feet: Secondary | ICD-10-CM | POA: Diagnosis not present

## 2023-05-07 DIAGNOSIS — R479 Unspecified speech disturbances: Secondary | ICD-10-CM | POA: Diagnosis not present

## 2023-05-08 DIAGNOSIS — R600 Localized edema: Secondary | ICD-10-CM | POA: Diagnosis not present

## 2023-05-09 DIAGNOSIS — I63422 Cerebral infarction due to embolism of left anterior cerebral artery: Secondary | ICD-10-CM | POA: Diagnosis not present

## 2023-05-09 DIAGNOSIS — R4782 Fluency disorder in conditions classified elsewhere: Secondary | ICD-10-CM | POA: Diagnosis not present

## 2023-05-09 DIAGNOSIS — R41841 Cognitive communication deficit: Secondary | ICD-10-CM | POA: Diagnosis not present

## 2023-05-09 DIAGNOSIS — M6281 Muscle weakness (generalized): Secondary | ICD-10-CM | POA: Diagnosis not present

## 2023-05-09 DIAGNOSIS — R479 Unspecified speech disturbances: Secondary | ICD-10-CM | POA: Diagnosis not present

## 2023-05-09 DIAGNOSIS — I480 Paroxysmal atrial fibrillation: Secondary | ICD-10-CM | POA: Diagnosis not present

## 2023-05-09 DIAGNOSIS — L03119 Cellulitis of unspecified part of limb: Secondary | ICD-10-CM | POA: Diagnosis not present

## 2023-05-09 DIAGNOSIS — I69320 Aphasia following cerebral infarction: Secondary | ICD-10-CM | POA: Diagnosis not present

## 2023-05-09 DIAGNOSIS — R2681 Unsteadiness on feet: Secondary | ICD-10-CM | POA: Diagnosis not present

## 2023-05-10 DIAGNOSIS — L03119 Cellulitis of unspecified part of limb: Secondary | ICD-10-CM | POA: Diagnosis not present

## 2023-05-10 DIAGNOSIS — R4782 Fluency disorder in conditions classified elsewhere: Secondary | ICD-10-CM | POA: Diagnosis not present

## 2023-05-10 DIAGNOSIS — I63422 Cerebral infarction due to embolism of left anterior cerebral artery: Secondary | ICD-10-CM | POA: Diagnosis not present

## 2023-05-10 DIAGNOSIS — R479 Unspecified speech disturbances: Secondary | ICD-10-CM | POA: Diagnosis not present

## 2023-05-10 DIAGNOSIS — R41841 Cognitive communication deficit: Secondary | ICD-10-CM | POA: Diagnosis not present

## 2023-05-10 DIAGNOSIS — I480 Paroxysmal atrial fibrillation: Secondary | ICD-10-CM | POA: Diagnosis not present

## 2023-05-10 DIAGNOSIS — M6281 Muscle weakness (generalized): Secondary | ICD-10-CM | POA: Diagnosis not present

## 2023-05-10 DIAGNOSIS — I69320 Aphasia following cerebral infarction: Secondary | ICD-10-CM | POA: Diagnosis not present

## 2023-05-10 DIAGNOSIS — R2681 Unsteadiness on feet: Secondary | ICD-10-CM | POA: Diagnosis not present

## 2023-05-13 DIAGNOSIS — I509 Heart failure, unspecified: Secondary | ICD-10-CM | POA: Diagnosis not present

## 2023-05-14 DIAGNOSIS — R479 Unspecified speech disturbances: Secondary | ICD-10-CM | POA: Diagnosis not present

## 2023-05-14 DIAGNOSIS — R2681 Unsteadiness on feet: Secondary | ICD-10-CM | POA: Diagnosis not present

## 2023-05-14 DIAGNOSIS — I63422 Cerebral infarction due to embolism of left anterior cerebral artery: Secondary | ICD-10-CM | POA: Diagnosis not present

## 2023-05-14 DIAGNOSIS — M6281 Muscle weakness (generalized): Secondary | ICD-10-CM | POA: Diagnosis not present

## 2023-05-14 DIAGNOSIS — I69320 Aphasia following cerebral infarction: Secondary | ICD-10-CM | POA: Diagnosis not present

## 2023-05-14 DIAGNOSIS — R4782 Fluency disorder in conditions classified elsewhere: Secondary | ICD-10-CM | POA: Diagnosis not present

## 2023-05-14 DIAGNOSIS — R41841 Cognitive communication deficit: Secondary | ICD-10-CM | POA: Diagnosis not present

## 2023-05-14 DIAGNOSIS — I480 Paroxysmal atrial fibrillation: Secondary | ICD-10-CM | POA: Diagnosis not present

## 2023-05-14 DIAGNOSIS — L03119 Cellulitis of unspecified part of limb: Secondary | ICD-10-CM | POA: Diagnosis not present

## 2023-05-15 DIAGNOSIS — R4782 Fluency disorder in conditions classified elsewhere: Secondary | ICD-10-CM | POA: Diagnosis not present

## 2023-05-15 DIAGNOSIS — I480 Paroxysmal atrial fibrillation: Secondary | ICD-10-CM | POA: Diagnosis not present

## 2023-05-15 DIAGNOSIS — R2681 Unsteadiness on feet: Secondary | ICD-10-CM | POA: Diagnosis not present

## 2023-05-15 DIAGNOSIS — I69320 Aphasia following cerebral infarction: Secondary | ICD-10-CM | POA: Diagnosis not present

## 2023-05-15 DIAGNOSIS — R41841 Cognitive communication deficit: Secondary | ICD-10-CM | POA: Diagnosis not present

## 2023-05-15 DIAGNOSIS — L03119 Cellulitis of unspecified part of limb: Secondary | ICD-10-CM | POA: Diagnosis not present

## 2023-05-15 DIAGNOSIS — R479 Unspecified speech disturbances: Secondary | ICD-10-CM | POA: Diagnosis not present

## 2023-05-15 DIAGNOSIS — M6281 Muscle weakness (generalized): Secondary | ICD-10-CM | POA: Diagnosis not present

## 2023-05-15 DIAGNOSIS — I63422 Cerebral infarction due to embolism of left anterior cerebral artery: Secondary | ICD-10-CM | POA: Diagnosis not present

## 2023-05-16 DIAGNOSIS — I63422 Cerebral infarction due to embolism of left anterior cerebral artery: Secondary | ICD-10-CM | POA: Diagnosis not present

## 2023-05-16 DIAGNOSIS — M6281 Muscle weakness (generalized): Secondary | ICD-10-CM | POA: Diagnosis not present

## 2023-05-16 DIAGNOSIS — I5032 Chronic diastolic (congestive) heart failure: Secondary | ICD-10-CM | POA: Diagnosis not present

## 2023-05-16 DIAGNOSIS — R41841 Cognitive communication deficit: Secondary | ICD-10-CM | POA: Diagnosis not present

## 2023-05-16 DIAGNOSIS — I69320 Aphasia following cerebral infarction: Secondary | ICD-10-CM | POA: Diagnosis not present

## 2023-05-16 DIAGNOSIS — R479 Unspecified speech disturbances: Secondary | ICD-10-CM | POA: Diagnosis not present

## 2023-05-16 DIAGNOSIS — R4782 Fluency disorder in conditions classified elsewhere: Secondary | ICD-10-CM | POA: Diagnosis not present

## 2023-05-16 DIAGNOSIS — I480 Paroxysmal atrial fibrillation: Secondary | ICD-10-CM | POA: Diagnosis not present

## 2023-05-16 DIAGNOSIS — R2681 Unsteadiness on feet: Secondary | ICD-10-CM | POA: Diagnosis not present

## 2023-05-16 DIAGNOSIS — L03119 Cellulitis of unspecified part of limb: Secondary | ICD-10-CM | POA: Diagnosis not present

## 2023-05-17 DIAGNOSIS — L814 Other melanin hyperpigmentation: Secondary | ICD-10-CM | POA: Diagnosis not present

## 2023-05-17 DIAGNOSIS — L570 Actinic keratosis: Secondary | ICD-10-CM | POA: Diagnosis not present

## 2023-05-17 DIAGNOSIS — D485 Neoplasm of uncertain behavior of skin: Secondary | ICD-10-CM | POA: Diagnosis not present

## 2023-05-17 DIAGNOSIS — R479 Unspecified speech disturbances: Secondary | ICD-10-CM | POA: Diagnosis not present

## 2023-05-17 DIAGNOSIS — M6281 Muscle weakness (generalized): Secondary | ICD-10-CM | POA: Diagnosis not present

## 2023-05-17 DIAGNOSIS — L03119 Cellulitis of unspecified part of limb: Secondary | ICD-10-CM | POA: Diagnosis not present

## 2023-05-17 DIAGNOSIS — R4782 Fluency disorder in conditions classified elsewhere: Secondary | ICD-10-CM | POA: Diagnosis not present

## 2023-05-17 DIAGNOSIS — I480 Paroxysmal atrial fibrillation: Secondary | ICD-10-CM | POA: Diagnosis not present

## 2023-05-17 DIAGNOSIS — L821 Other seborrheic keratosis: Secondary | ICD-10-CM | POA: Diagnosis not present

## 2023-05-17 DIAGNOSIS — I63422 Cerebral infarction due to embolism of left anterior cerebral artery: Secondary | ICD-10-CM | POA: Diagnosis not present

## 2023-05-17 DIAGNOSIS — R41841 Cognitive communication deficit: Secondary | ICD-10-CM | POA: Diagnosis not present

## 2023-05-17 DIAGNOSIS — R2681 Unsteadiness on feet: Secondary | ICD-10-CM | POA: Diagnosis not present

## 2023-05-17 DIAGNOSIS — I69320 Aphasia following cerebral infarction: Secondary | ICD-10-CM | POA: Diagnosis not present

## 2023-05-18 DIAGNOSIS — C4441 Basal cell carcinoma of skin of scalp and neck: Secondary | ICD-10-CM | POA: Diagnosis not present

## 2023-05-18 DIAGNOSIS — L011 Impetiginization of other dermatoses: Secondary | ICD-10-CM | POA: Diagnosis not present

## 2023-05-18 DIAGNOSIS — L98499 Non-pressure chronic ulcer of skin of other sites with unspecified severity: Secondary | ICD-10-CM | POA: Diagnosis not present

## 2023-05-20 DIAGNOSIS — R2681 Unsteadiness on feet: Secondary | ICD-10-CM | POA: Diagnosis not present

## 2023-05-20 DIAGNOSIS — I69320 Aphasia following cerebral infarction: Secondary | ICD-10-CM | POA: Diagnosis not present

## 2023-05-20 DIAGNOSIS — R41841 Cognitive communication deficit: Secondary | ICD-10-CM | POA: Diagnosis not present

## 2023-05-20 DIAGNOSIS — I480 Paroxysmal atrial fibrillation: Secondary | ICD-10-CM | POA: Diagnosis not present

## 2023-05-20 DIAGNOSIS — R4782 Fluency disorder in conditions classified elsewhere: Secondary | ICD-10-CM | POA: Diagnosis not present

## 2023-05-20 DIAGNOSIS — R479 Unspecified speech disturbances: Secondary | ICD-10-CM | POA: Diagnosis not present

## 2023-05-20 DIAGNOSIS — L03119 Cellulitis of unspecified part of limb: Secondary | ICD-10-CM | POA: Diagnosis not present

## 2023-05-20 DIAGNOSIS — I63422 Cerebral infarction due to embolism of left anterior cerebral artery: Secondary | ICD-10-CM | POA: Diagnosis not present

## 2023-05-20 DIAGNOSIS — M6281 Muscle weakness (generalized): Secondary | ICD-10-CM | POA: Diagnosis not present

## 2023-05-21 DIAGNOSIS — L03119 Cellulitis of unspecified part of limb: Secondary | ICD-10-CM | POA: Diagnosis not present

## 2023-05-21 DIAGNOSIS — R2681 Unsteadiness on feet: Secondary | ICD-10-CM | POA: Diagnosis not present

## 2023-05-21 DIAGNOSIS — R4782 Fluency disorder in conditions classified elsewhere: Secondary | ICD-10-CM | POA: Diagnosis not present

## 2023-05-21 DIAGNOSIS — I480 Paroxysmal atrial fibrillation: Secondary | ICD-10-CM | POA: Diagnosis not present

## 2023-05-21 DIAGNOSIS — R41841 Cognitive communication deficit: Secondary | ICD-10-CM | POA: Diagnosis not present

## 2023-05-21 DIAGNOSIS — R479 Unspecified speech disturbances: Secondary | ICD-10-CM | POA: Diagnosis not present

## 2023-05-21 DIAGNOSIS — I63422 Cerebral infarction due to embolism of left anterior cerebral artery: Secondary | ICD-10-CM | POA: Diagnosis not present

## 2023-05-21 DIAGNOSIS — M6281 Muscle weakness (generalized): Secondary | ICD-10-CM | POA: Diagnosis not present

## 2023-05-21 DIAGNOSIS — I69320 Aphasia following cerebral infarction: Secondary | ICD-10-CM | POA: Diagnosis not present

## 2023-05-22 DIAGNOSIS — R4782 Fluency disorder in conditions classified elsewhere: Secondary | ICD-10-CM | POA: Diagnosis not present

## 2023-05-22 DIAGNOSIS — R2681 Unsteadiness on feet: Secondary | ICD-10-CM | POA: Diagnosis not present

## 2023-05-22 DIAGNOSIS — R41841 Cognitive communication deficit: Secondary | ICD-10-CM | POA: Diagnosis not present

## 2023-05-22 DIAGNOSIS — R479 Unspecified speech disturbances: Secondary | ICD-10-CM | POA: Diagnosis not present

## 2023-05-22 DIAGNOSIS — I63422 Cerebral infarction due to embolism of left anterior cerebral artery: Secondary | ICD-10-CM | POA: Diagnosis not present

## 2023-05-22 DIAGNOSIS — L03119 Cellulitis of unspecified part of limb: Secondary | ICD-10-CM | POA: Diagnosis not present

## 2023-05-22 DIAGNOSIS — I69320 Aphasia following cerebral infarction: Secondary | ICD-10-CM | POA: Diagnosis not present

## 2023-05-22 DIAGNOSIS — I480 Paroxysmal atrial fibrillation: Secondary | ICD-10-CM | POA: Diagnosis not present

## 2023-05-22 DIAGNOSIS — M6281 Muscle weakness (generalized): Secondary | ICD-10-CM | POA: Diagnosis not present

## 2023-05-23 DIAGNOSIS — R4782 Fluency disorder in conditions classified elsewhere: Secondary | ICD-10-CM | POA: Diagnosis not present

## 2023-05-23 DIAGNOSIS — R41841 Cognitive communication deficit: Secondary | ICD-10-CM | POA: Diagnosis not present

## 2023-05-23 DIAGNOSIS — I480 Paroxysmal atrial fibrillation: Secondary | ICD-10-CM | POA: Diagnosis not present

## 2023-05-23 DIAGNOSIS — L03119 Cellulitis of unspecified part of limb: Secondary | ICD-10-CM | POA: Diagnosis not present

## 2023-05-23 DIAGNOSIS — I69320 Aphasia following cerebral infarction: Secondary | ICD-10-CM | POA: Diagnosis not present

## 2023-05-23 DIAGNOSIS — R479 Unspecified speech disturbances: Secondary | ICD-10-CM | POA: Diagnosis not present

## 2023-05-23 DIAGNOSIS — R2681 Unsteadiness on feet: Secondary | ICD-10-CM | POA: Diagnosis not present

## 2023-05-23 DIAGNOSIS — I63422 Cerebral infarction due to embolism of left anterior cerebral artery: Secondary | ICD-10-CM | POA: Diagnosis not present

## 2023-05-23 DIAGNOSIS — M6281 Muscle weakness (generalized): Secondary | ICD-10-CM | POA: Diagnosis not present

## 2023-05-24 DIAGNOSIS — R479 Unspecified speech disturbances: Secondary | ICD-10-CM | POA: Diagnosis not present

## 2023-05-24 DIAGNOSIS — I63422 Cerebral infarction due to embolism of left anterior cerebral artery: Secondary | ICD-10-CM | POA: Diagnosis not present

## 2023-05-24 DIAGNOSIS — R2681 Unsteadiness on feet: Secondary | ICD-10-CM | POA: Diagnosis not present

## 2023-05-24 DIAGNOSIS — I480 Paroxysmal atrial fibrillation: Secondary | ICD-10-CM | POA: Diagnosis not present

## 2023-05-24 DIAGNOSIS — L03119 Cellulitis of unspecified part of limb: Secondary | ICD-10-CM | POA: Diagnosis not present

## 2023-05-24 DIAGNOSIS — I69320 Aphasia following cerebral infarction: Secondary | ICD-10-CM | POA: Diagnosis not present

## 2023-05-24 DIAGNOSIS — R41841 Cognitive communication deficit: Secondary | ICD-10-CM | POA: Diagnosis not present

## 2023-05-24 DIAGNOSIS — M6281 Muscle weakness (generalized): Secondary | ICD-10-CM | POA: Diagnosis not present

## 2023-05-24 DIAGNOSIS — R4782 Fluency disorder in conditions classified elsewhere: Secondary | ICD-10-CM | POA: Diagnosis not present

## 2023-05-27 DIAGNOSIS — R2681 Unsteadiness on feet: Secondary | ICD-10-CM | POA: Diagnosis not present

## 2023-05-27 DIAGNOSIS — I69320 Aphasia following cerebral infarction: Secondary | ICD-10-CM | POA: Diagnosis not present

## 2023-05-27 DIAGNOSIS — R41841 Cognitive communication deficit: Secondary | ICD-10-CM | POA: Diagnosis not present

## 2023-05-27 DIAGNOSIS — R4782 Fluency disorder in conditions classified elsewhere: Secondary | ICD-10-CM | POA: Diagnosis not present

## 2023-05-27 DIAGNOSIS — I5032 Chronic diastolic (congestive) heart failure: Secondary | ICD-10-CM | POA: Diagnosis not present

## 2023-05-27 DIAGNOSIS — M6281 Muscle weakness (generalized): Secondary | ICD-10-CM | POA: Diagnosis not present

## 2023-05-27 DIAGNOSIS — R479 Unspecified speech disturbances: Secondary | ICD-10-CM | POA: Diagnosis not present

## 2023-05-27 DIAGNOSIS — L03119 Cellulitis of unspecified part of limb: Secondary | ICD-10-CM | POA: Diagnosis not present

## 2023-05-27 DIAGNOSIS — I480 Paroxysmal atrial fibrillation: Secondary | ICD-10-CM | POA: Diagnosis not present

## 2023-05-27 DIAGNOSIS — I63422 Cerebral infarction due to embolism of left anterior cerebral artery: Secondary | ICD-10-CM | POA: Diagnosis not present

## 2023-05-28 DIAGNOSIS — I63422 Cerebral infarction due to embolism of left anterior cerebral artery: Secondary | ICD-10-CM | POA: Diagnosis not present

## 2023-05-28 DIAGNOSIS — R41841 Cognitive communication deficit: Secondary | ICD-10-CM | POA: Diagnosis not present

## 2023-05-28 DIAGNOSIS — R2681 Unsteadiness on feet: Secondary | ICD-10-CM | POA: Diagnosis not present

## 2023-05-28 DIAGNOSIS — L03119 Cellulitis of unspecified part of limb: Secondary | ICD-10-CM | POA: Diagnosis not present

## 2023-05-28 DIAGNOSIS — R1310 Dysphagia, unspecified: Secondary | ICD-10-CM | POA: Diagnosis not present

## 2023-05-28 DIAGNOSIS — I480 Paroxysmal atrial fibrillation: Secondary | ICD-10-CM | POA: Diagnosis not present

## 2023-05-28 DIAGNOSIS — R479 Unspecified speech disturbances: Secondary | ICD-10-CM | POA: Diagnosis not present

## 2023-05-28 DIAGNOSIS — M6281 Muscle weakness (generalized): Secondary | ICD-10-CM | POA: Diagnosis not present

## 2023-05-28 DIAGNOSIS — R4782 Fluency disorder in conditions classified elsewhere: Secondary | ICD-10-CM | POA: Diagnosis not present

## 2023-05-29 DIAGNOSIS — R479 Unspecified speech disturbances: Secondary | ICD-10-CM | POA: Diagnosis not present

## 2023-05-29 DIAGNOSIS — R2681 Unsteadiness on feet: Secondary | ICD-10-CM | POA: Diagnosis not present

## 2023-05-29 DIAGNOSIS — I63422 Cerebral infarction due to embolism of left anterior cerebral artery: Secondary | ICD-10-CM | POA: Diagnosis not present

## 2023-05-29 DIAGNOSIS — L03119 Cellulitis of unspecified part of limb: Secondary | ICD-10-CM | POA: Diagnosis not present

## 2023-05-29 DIAGNOSIS — M6281 Muscle weakness (generalized): Secondary | ICD-10-CM | POA: Diagnosis not present

## 2023-05-29 DIAGNOSIS — R1310 Dysphagia, unspecified: Secondary | ICD-10-CM | POA: Diagnosis not present

## 2023-05-29 DIAGNOSIS — I480 Paroxysmal atrial fibrillation: Secondary | ICD-10-CM | POA: Diagnosis not present

## 2023-05-29 DIAGNOSIS — R4782 Fluency disorder in conditions classified elsewhere: Secondary | ICD-10-CM | POA: Diagnosis not present

## 2023-05-29 DIAGNOSIS — R41841 Cognitive communication deficit: Secondary | ICD-10-CM | POA: Diagnosis not present

## 2023-05-30 DIAGNOSIS — R479 Unspecified speech disturbances: Secondary | ICD-10-CM | POA: Diagnosis not present

## 2023-05-30 DIAGNOSIS — R41841 Cognitive communication deficit: Secondary | ICD-10-CM | POA: Diagnosis not present

## 2023-05-30 DIAGNOSIS — I63422 Cerebral infarction due to embolism of left anterior cerebral artery: Secondary | ICD-10-CM | POA: Diagnosis not present

## 2023-05-30 DIAGNOSIS — R2681 Unsteadiness on feet: Secondary | ICD-10-CM | POA: Diagnosis not present

## 2023-05-30 DIAGNOSIS — I480 Paroxysmal atrial fibrillation: Secondary | ICD-10-CM | POA: Diagnosis not present

## 2023-05-30 DIAGNOSIS — M6281 Muscle weakness (generalized): Secondary | ICD-10-CM | POA: Diagnosis not present

## 2023-05-30 DIAGNOSIS — L03119 Cellulitis of unspecified part of limb: Secondary | ICD-10-CM | POA: Diagnosis not present

## 2023-05-30 DIAGNOSIS — R4782 Fluency disorder in conditions classified elsewhere: Secondary | ICD-10-CM | POA: Diagnosis not present

## 2023-05-30 DIAGNOSIS — R1310 Dysphagia, unspecified: Secondary | ICD-10-CM | POA: Diagnosis not present

## 2023-05-31 DIAGNOSIS — L03119 Cellulitis of unspecified part of limb: Secondary | ICD-10-CM | POA: Diagnosis not present

## 2023-05-31 DIAGNOSIS — I480 Paroxysmal atrial fibrillation: Secondary | ICD-10-CM | POA: Diagnosis not present

## 2023-05-31 DIAGNOSIS — R41841 Cognitive communication deficit: Secondary | ICD-10-CM | POA: Diagnosis not present

## 2023-05-31 DIAGNOSIS — R4782 Fluency disorder in conditions classified elsewhere: Secondary | ICD-10-CM | POA: Diagnosis not present

## 2023-05-31 DIAGNOSIS — R2681 Unsteadiness on feet: Secondary | ICD-10-CM | POA: Diagnosis not present

## 2023-05-31 DIAGNOSIS — R1310 Dysphagia, unspecified: Secondary | ICD-10-CM | POA: Diagnosis not present

## 2023-05-31 DIAGNOSIS — I63422 Cerebral infarction due to embolism of left anterior cerebral artery: Secondary | ICD-10-CM | POA: Diagnosis not present

## 2023-05-31 DIAGNOSIS — M6281 Muscle weakness (generalized): Secondary | ICD-10-CM | POA: Diagnosis not present

## 2023-05-31 DIAGNOSIS — R479 Unspecified speech disturbances: Secondary | ICD-10-CM | POA: Diagnosis not present

## 2023-06-03 DIAGNOSIS — M6281 Muscle weakness (generalized): Secondary | ICD-10-CM | POA: Diagnosis not present

## 2023-06-03 DIAGNOSIS — I63422 Cerebral infarction due to embolism of left anterior cerebral artery: Secondary | ICD-10-CM | POA: Diagnosis not present

## 2023-06-03 DIAGNOSIS — I480 Paroxysmal atrial fibrillation: Secondary | ICD-10-CM | POA: Diagnosis not present

## 2023-06-03 DIAGNOSIS — L03119 Cellulitis of unspecified part of limb: Secondary | ICD-10-CM | POA: Diagnosis not present

## 2023-06-03 DIAGNOSIS — R479 Unspecified speech disturbances: Secondary | ICD-10-CM | POA: Diagnosis not present

## 2023-06-03 DIAGNOSIS — R1310 Dysphagia, unspecified: Secondary | ICD-10-CM | POA: Diagnosis not present

## 2023-06-03 DIAGNOSIS — R2681 Unsteadiness on feet: Secondary | ICD-10-CM | POA: Diagnosis not present

## 2023-06-03 DIAGNOSIS — R41841 Cognitive communication deficit: Secondary | ICD-10-CM | POA: Diagnosis not present

## 2023-06-03 DIAGNOSIS — R4782 Fluency disorder in conditions classified elsewhere: Secondary | ICD-10-CM | POA: Diagnosis not present

## 2023-06-05 DIAGNOSIS — R4782 Fluency disorder in conditions classified elsewhere: Secondary | ICD-10-CM | POA: Diagnosis not present

## 2023-06-05 DIAGNOSIS — I480 Paroxysmal atrial fibrillation: Secondary | ICD-10-CM | POA: Diagnosis not present

## 2023-06-05 DIAGNOSIS — M6281 Muscle weakness (generalized): Secondary | ICD-10-CM | POA: Diagnosis not present

## 2023-06-05 DIAGNOSIS — I509 Heart failure, unspecified: Secondary | ICD-10-CM | POA: Diagnosis not present

## 2023-06-05 DIAGNOSIS — L03119 Cellulitis of unspecified part of limb: Secondary | ICD-10-CM | POA: Diagnosis not present

## 2023-06-05 DIAGNOSIS — R479 Unspecified speech disturbances: Secondary | ICD-10-CM | POA: Diagnosis not present

## 2023-06-05 DIAGNOSIS — I63422 Cerebral infarction due to embolism of left anterior cerebral artery: Secondary | ICD-10-CM | POA: Diagnosis not present

## 2023-06-05 DIAGNOSIS — R2681 Unsteadiness on feet: Secondary | ICD-10-CM | POA: Diagnosis not present

## 2023-06-05 DIAGNOSIS — R1310 Dysphagia, unspecified: Secondary | ICD-10-CM | POA: Diagnosis not present

## 2023-06-05 DIAGNOSIS — R41841 Cognitive communication deficit: Secondary | ICD-10-CM | POA: Diagnosis not present

## 2023-06-06 DIAGNOSIS — M6281 Muscle weakness (generalized): Secondary | ICD-10-CM | POA: Diagnosis not present

## 2023-06-06 DIAGNOSIS — I63422 Cerebral infarction due to embolism of left anterior cerebral artery: Secondary | ICD-10-CM | POA: Diagnosis not present

## 2023-06-06 DIAGNOSIS — R41841 Cognitive communication deficit: Secondary | ICD-10-CM | POA: Diagnosis not present

## 2023-06-06 DIAGNOSIS — I480 Paroxysmal atrial fibrillation: Secondary | ICD-10-CM | POA: Diagnosis not present

## 2023-06-06 DIAGNOSIS — R2681 Unsteadiness on feet: Secondary | ICD-10-CM | POA: Diagnosis not present

## 2023-06-06 DIAGNOSIS — R479 Unspecified speech disturbances: Secondary | ICD-10-CM | POA: Diagnosis not present

## 2023-06-06 DIAGNOSIS — L03119 Cellulitis of unspecified part of limb: Secondary | ICD-10-CM | POA: Diagnosis not present

## 2023-06-06 DIAGNOSIS — R1310 Dysphagia, unspecified: Secondary | ICD-10-CM | POA: Diagnosis not present

## 2023-06-06 DIAGNOSIS — R4782 Fluency disorder in conditions classified elsewhere: Secondary | ICD-10-CM | POA: Diagnosis not present

## 2023-06-07 DIAGNOSIS — R4782 Fluency disorder in conditions classified elsewhere: Secondary | ICD-10-CM | POA: Diagnosis not present

## 2023-06-07 DIAGNOSIS — R1310 Dysphagia, unspecified: Secondary | ICD-10-CM | POA: Diagnosis not present

## 2023-06-07 DIAGNOSIS — R41841 Cognitive communication deficit: Secondary | ICD-10-CM | POA: Diagnosis not present

## 2023-06-07 DIAGNOSIS — R2681 Unsteadiness on feet: Secondary | ICD-10-CM | POA: Diagnosis not present

## 2023-06-07 DIAGNOSIS — I480 Paroxysmal atrial fibrillation: Secondary | ICD-10-CM | POA: Diagnosis not present

## 2023-06-07 DIAGNOSIS — M6281 Muscle weakness (generalized): Secondary | ICD-10-CM | POA: Diagnosis not present

## 2023-06-07 DIAGNOSIS — R479 Unspecified speech disturbances: Secondary | ICD-10-CM | POA: Diagnosis not present

## 2023-06-07 DIAGNOSIS — N1832 Chronic kidney disease, stage 3b: Secondary | ICD-10-CM | POA: Diagnosis not present

## 2023-06-07 DIAGNOSIS — I63422 Cerebral infarction due to embolism of left anterior cerebral artery: Secondary | ICD-10-CM | POA: Diagnosis not present

## 2023-06-07 DIAGNOSIS — I5032 Chronic diastolic (congestive) heart failure: Secondary | ICD-10-CM | POA: Diagnosis not present

## 2023-06-07 DIAGNOSIS — L03119 Cellulitis of unspecified part of limb: Secondary | ICD-10-CM | POA: Diagnosis not present

## 2023-06-10 DIAGNOSIS — I480 Paroxysmal atrial fibrillation: Secondary | ICD-10-CM | POA: Diagnosis not present

## 2023-06-10 DIAGNOSIS — R1310 Dysphagia, unspecified: Secondary | ICD-10-CM | POA: Diagnosis not present

## 2023-06-10 DIAGNOSIS — R41841 Cognitive communication deficit: Secondary | ICD-10-CM | POA: Diagnosis not present

## 2023-06-10 DIAGNOSIS — R479 Unspecified speech disturbances: Secondary | ICD-10-CM | POA: Diagnosis not present

## 2023-06-10 DIAGNOSIS — L03119 Cellulitis of unspecified part of limb: Secondary | ICD-10-CM | POA: Diagnosis not present

## 2023-06-10 DIAGNOSIS — R2681 Unsteadiness on feet: Secondary | ICD-10-CM | POA: Diagnosis not present

## 2023-06-10 DIAGNOSIS — R4782 Fluency disorder in conditions classified elsewhere: Secondary | ICD-10-CM | POA: Diagnosis not present

## 2023-06-10 DIAGNOSIS — I63422 Cerebral infarction due to embolism of left anterior cerebral artery: Secondary | ICD-10-CM | POA: Diagnosis not present

## 2023-06-10 DIAGNOSIS — M6281 Muscle weakness (generalized): Secondary | ICD-10-CM | POA: Diagnosis not present

## 2023-06-11 DIAGNOSIS — R2681 Unsteadiness on feet: Secondary | ICD-10-CM | POA: Diagnosis not present

## 2023-06-11 DIAGNOSIS — R41841 Cognitive communication deficit: Secondary | ICD-10-CM | POA: Diagnosis not present

## 2023-06-11 DIAGNOSIS — I480 Paroxysmal atrial fibrillation: Secondary | ICD-10-CM | POA: Diagnosis not present

## 2023-06-11 DIAGNOSIS — R4782 Fluency disorder in conditions classified elsewhere: Secondary | ICD-10-CM | POA: Diagnosis not present

## 2023-06-11 DIAGNOSIS — R1310 Dysphagia, unspecified: Secondary | ICD-10-CM | POA: Diagnosis not present

## 2023-06-11 DIAGNOSIS — L03119 Cellulitis of unspecified part of limb: Secondary | ICD-10-CM | POA: Diagnosis not present

## 2023-06-11 DIAGNOSIS — R479 Unspecified speech disturbances: Secondary | ICD-10-CM | POA: Diagnosis not present

## 2023-06-11 DIAGNOSIS — I63422 Cerebral infarction due to embolism of left anterior cerebral artery: Secondary | ICD-10-CM | POA: Diagnosis not present

## 2023-06-11 DIAGNOSIS — M6281 Muscle weakness (generalized): Secondary | ICD-10-CM | POA: Diagnosis not present

## 2023-06-12 DIAGNOSIS — R479 Unspecified speech disturbances: Secondary | ICD-10-CM | POA: Diagnosis not present

## 2023-06-12 DIAGNOSIS — R2681 Unsteadiness on feet: Secondary | ICD-10-CM | POA: Diagnosis not present

## 2023-06-12 DIAGNOSIS — L03119 Cellulitis of unspecified part of limb: Secondary | ICD-10-CM | POA: Diagnosis not present

## 2023-06-12 DIAGNOSIS — R1310 Dysphagia, unspecified: Secondary | ICD-10-CM | POA: Diagnosis not present

## 2023-06-12 DIAGNOSIS — M6281 Muscle weakness (generalized): Secondary | ICD-10-CM | POA: Diagnosis not present

## 2023-06-12 DIAGNOSIS — I480 Paroxysmal atrial fibrillation: Secondary | ICD-10-CM | POA: Diagnosis not present

## 2023-06-12 DIAGNOSIS — I63422 Cerebral infarction due to embolism of left anterior cerebral artery: Secondary | ICD-10-CM | POA: Diagnosis not present

## 2023-06-12 DIAGNOSIS — R41841 Cognitive communication deficit: Secondary | ICD-10-CM | POA: Diagnosis not present

## 2023-06-12 DIAGNOSIS — R4782 Fluency disorder in conditions classified elsewhere: Secondary | ICD-10-CM | POA: Diagnosis not present

## 2023-06-13 DIAGNOSIS — I63422 Cerebral infarction due to embolism of left anterior cerebral artery: Secondary | ICD-10-CM | POA: Diagnosis not present

## 2023-06-13 DIAGNOSIS — R479 Unspecified speech disturbances: Secondary | ICD-10-CM | POA: Diagnosis not present

## 2023-06-13 DIAGNOSIS — R1310 Dysphagia, unspecified: Secondary | ICD-10-CM | POA: Diagnosis not present

## 2023-06-13 DIAGNOSIS — M6281 Muscle weakness (generalized): Secondary | ICD-10-CM | POA: Diagnosis not present

## 2023-06-13 DIAGNOSIS — R41841 Cognitive communication deficit: Secondary | ICD-10-CM | POA: Diagnosis not present

## 2023-06-13 DIAGNOSIS — R4782 Fluency disorder in conditions classified elsewhere: Secondary | ICD-10-CM | POA: Diagnosis not present

## 2023-06-13 DIAGNOSIS — I480 Paroxysmal atrial fibrillation: Secondary | ICD-10-CM | POA: Diagnosis not present

## 2023-06-13 DIAGNOSIS — R2681 Unsteadiness on feet: Secondary | ICD-10-CM | POA: Diagnosis not present

## 2023-06-13 DIAGNOSIS — L03119 Cellulitis of unspecified part of limb: Secondary | ICD-10-CM | POA: Diagnosis not present

## 2023-06-14 DIAGNOSIS — L570 Actinic keratosis: Secondary | ICD-10-CM | POA: Diagnosis not present

## 2023-06-14 DIAGNOSIS — L03119 Cellulitis of unspecified part of limb: Secondary | ICD-10-CM | POA: Diagnosis not present

## 2023-06-14 DIAGNOSIS — I63422 Cerebral infarction due to embolism of left anterior cerebral artery: Secondary | ICD-10-CM | POA: Diagnosis not present

## 2023-06-14 DIAGNOSIS — R4782 Fluency disorder in conditions classified elsewhere: Secondary | ICD-10-CM | POA: Diagnosis not present

## 2023-06-14 DIAGNOSIS — R1310 Dysphagia, unspecified: Secondary | ICD-10-CM | POA: Diagnosis not present

## 2023-06-14 DIAGNOSIS — M6281 Muscle weakness (generalized): Secondary | ICD-10-CM | POA: Diagnosis not present

## 2023-06-14 DIAGNOSIS — R479 Unspecified speech disturbances: Secondary | ICD-10-CM | POA: Diagnosis not present

## 2023-06-14 DIAGNOSIS — I480 Paroxysmal atrial fibrillation: Secondary | ICD-10-CM | POA: Diagnosis not present

## 2023-06-14 DIAGNOSIS — R2681 Unsteadiness on feet: Secondary | ICD-10-CM | POA: Diagnosis not present

## 2023-06-14 DIAGNOSIS — R41841 Cognitive communication deficit: Secondary | ICD-10-CM | POA: Diagnosis not present

## 2023-06-14 DIAGNOSIS — C4441 Basal cell carcinoma of skin of scalp and neck: Secondary | ICD-10-CM | POA: Diagnosis not present

## 2023-06-17 DIAGNOSIS — R2681 Unsteadiness on feet: Secondary | ICD-10-CM | POA: Diagnosis not present

## 2023-06-17 DIAGNOSIS — R41841 Cognitive communication deficit: Secondary | ICD-10-CM | POA: Diagnosis not present

## 2023-06-17 DIAGNOSIS — M6281 Muscle weakness (generalized): Secondary | ICD-10-CM | POA: Diagnosis not present

## 2023-06-17 DIAGNOSIS — R479 Unspecified speech disturbances: Secondary | ICD-10-CM | POA: Diagnosis not present

## 2023-06-17 DIAGNOSIS — I480 Paroxysmal atrial fibrillation: Secondary | ICD-10-CM | POA: Diagnosis not present

## 2023-06-17 DIAGNOSIS — L03119 Cellulitis of unspecified part of limb: Secondary | ICD-10-CM | POA: Diagnosis not present

## 2023-06-17 DIAGNOSIS — R1310 Dysphagia, unspecified: Secondary | ICD-10-CM | POA: Diagnosis not present

## 2023-06-17 DIAGNOSIS — I63422 Cerebral infarction due to embolism of left anterior cerebral artery: Secondary | ICD-10-CM | POA: Diagnosis not present

## 2023-06-17 DIAGNOSIS — R4782 Fluency disorder in conditions classified elsewhere: Secondary | ICD-10-CM | POA: Diagnosis not present

## 2023-06-18 DIAGNOSIS — R479 Unspecified speech disturbances: Secondary | ICD-10-CM | POA: Diagnosis not present

## 2023-06-18 DIAGNOSIS — M6281 Muscle weakness (generalized): Secondary | ICD-10-CM | POA: Diagnosis not present

## 2023-06-18 DIAGNOSIS — R41841 Cognitive communication deficit: Secondary | ICD-10-CM | POA: Diagnosis not present

## 2023-06-18 DIAGNOSIS — R2681 Unsteadiness on feet: Secondary | ICD-10-CM | POA: Diagnosis not present

## 2023-06-18 DIAGNOSIS — L03119 Cellulitis of unspecified part of limb: Secondary | ICD-10-CM | POA: Diagnosis not present

## 2023-06-18 DIAGNOSIS — I63422 Cerebral infarction due to embolism of left anterior cerebral artery: Secondary | ICD-10-CM | POA: Diagnosis not present

## 2023-06-18 DIAGNOSIS — I480 Paroxysmal atrial fibrillation: Secondary | ICD-10-CM | POA: Diagnosis not present

## 2023-06-18 DIAGNOSIS — R4782 Fluency disorder in conditions classified elsewhere: Secondary | ICD-10-CM | POA: Diagnosis not present

## 2023-06-18 DIAGNOSIS — R1310 Dysphagia, unspecified: Secondary | ICD-10-CM | POA: Diagnosis not present

## 2023-06-20 DIAGNOSIS — R1310 Dysphagia, unspecified: Secondary | ICD-10-CM | POA: Diagnosis not present

## 2023-06-20 DIAGNOSIS — R4782 Fluency disorder in conditions classified elsewhere: Secondary | ICD-10-CM | POA: Diagnosis not present

## 2023-06-20 DIAGNOSIS — L03119 Cellulitis of unspecified part of limb: Secondary | ICD-10-CM | POA: Diagnosis not present

## 2023-06-20 DIAGNOSIS — R41841 Cognitive communication deficit: Secondary | ICD-10-CM | POA: Diagnosis not present

## 2023-06-20 DIAGNOSIS — R479 Unspecified speech disturbances: Secondary | ICD-10-CM | POA: Diagnosis not present

## 2023-06-20 DIAGNOSIS — I480 Paroxysmal atrial fibrillation: Secondary | ICD-10-CM | POA: Diagnosis not present

## 2023-06-20 DIAGNOSIS — R2681 Unsteadiness on feet: Secondary | ICD-10-CM | POA: Diagnosis not present

## 2023-06-20 DIAGNOSIS — I63422 Cerebral infarction due to embolism of left anterior cerebral artery: Secondary | ICD-10-CM | POA: Diagnosis not present

## 2023-06-20 DIAGNOSIS — M6281 Muscle weakness (generalized): Secondary | ICD-10-CM | POA: Diagnosis not present

## 2023-06-21 DIAGNOSIS — I480 Paroxysmal atrial fibrillation: Secondary | ICD-10-CM | POA: Diagnosis not present

## 2023-06-21 DIAGNOSIS — L03119 Cellulitis of unspecified part of limb: Secondary | ICD-10-CM | POA: Diagnosis not present

## 2023-06-21 DIAGNOSIS — R2681 Unsteadiness on feet: Secondary | ICD-10-CM | POA: Diagnosis not present

## 2023-06-21 DIAGNOSIS — M6281 Muscle weakness (generalized): Secondary | ICD-10-CM | POA: Diagnosis not present

## 2023-06-21 DIAGNOSIS — I63422 Cerebral infarction due to embolism of left anterior cerebral artery: Secondary | ICD-10-CM | POA: Diagnosis not present

## 2023-06-21 DIAGNOSIS — R41841 Cognitive communication deficit: Secondary | ICD-10-CM | POA: Diagnosis not present

## 2023-06-21 DIAGNOSIS — R479 Unspecified speech disturbances: Secondary | ICD-10-CM | POA: Diagnosis not present

## 2023-06-21 DIAGNOSIS — R4782 Fluency disorder in conditions classified elsewhere: Secondary | ICD-10-CM | POA: Diagnosis not present

## 2023-06-21 DIAGNOSIS — R1310 Dysphagia, unspecified: Secondary | ICD-10-CM | POA: Diagnosis not present

## 2023-06-24 DIAGNOSIS — R479 Unspecified speech disturbances: Secondary | ICD-10-CM | POA: Diagnosis not present

## 2023-06-24 DIAGNOSIS — R2681 Unsteadiness on feet: Secondary | ICD-10-CM | POA: Diagnosis not present

## 2023-06-24 DIAGNOSIS — R351 Nocturia: Secondary | ICD-10-CM | POA: Diagnosis not present

## 2023-06-24 DIAGNOSIS — D509 Iron deficiency anemia, unspecified: Secondary | ICD-10-CM | POA: Diagnosis not present

## 2023-06-24 DIAGNOSIS — R5383 Other fatigue: Secondary | ICD-10-CM | POA: Diagnosis not present

## 2023-06-24 DIAGNOSIS — L03119 Cellulitis of unspecified part of limb: Secondary | ICD-10-CM | POA: Diagnosis not present

## 2023-06-24 DIAGNOSIS — I480 Paroxysmal atrial fibrillation: Secondary | ICD-10-CM | POA: Diagnosis not present

## 2023-06-24 DIAGNOSIS — R4782 Fluency disorder in conditions classified elsewhere: Secondary | ICD-10-CM | POA: Diagnosis not present

## 2023-06-24 DIAGNOSIS — R1310 Dysphagia, unspecified: Secondary | ICD-10-CM | POA: Diagnosis not present

## 2023-06-24 DIAGNOSIS — M6281 Muscle weakness (generalized): Secondary | ICD-10-CM | POA: Diagnosis not present

## 2023-06-24 DIAGNOSIS — R5381 Other malaise: Secondary | ICD-10-CM | POA: Diagnosis not present

## 2023-06-24 DIAGNOSIS — I63422 Cerebral infarction due to embolism of left anterior cerebral artery: Secondary | ICD-10-CM | POA: Diagnosis not present

## 2023-06-24 DIAGNOSIS — R41841 Cognitive communication deficit: Secondary | ICD-10-CM | POA: Diagnosis not present

## 2023-06-24 DIAGNOSIS — I5032 Chronic diastolic (congestive) heart failure: Secondary | ICD-10-CM | POA: Diagnosis not present

## 2023-06-26 DIAGNOSIS — M6281 Muscle weakness (generalized): Secondary | ICD-10-CM | POA: Diagnosis not present

## 2023-06-26 DIAGNOSIS — R4782 Fluency disorder in conditions classified elsewhere: Secondary | ICD-10-CM | POA: Diagnosis not present

## 2023-06-26 DIAGNOSIS — R41841 Cognitive communication deficit: Secondary | ICD-10-CM | POA: Diagnosis not present

## 2023-06-26 DIAGNOSIS — R2681 Unsteadiness on feet: Secondary | ICD-10-CM | POA: Diagnosis not present

## 2023-06-26 DIAGNOSIS — R1310 Dysphagia, unspecified: Secondary | ICD-10-CM | POA: Diagnosis not present

## 2023-06-26 DIAGNOSIS — I480 Paroxysmal atrial fibrillation: Secondary | ICD-10-CM | POA: Diagnosis not present

## 2023-06-26 DIAGNOSIS — R479 Unspecified speech disturbances: Secondary | ICD-10-CM | POA: Diagnosis not present

## 2023-06-26 DIAGNOSIS — L03119 Cellulitis of unspecified part of limb: Secondary | ICD-10-CM | POA: Diagnosis not present

## 2023-06-26 DIAGNOSIS — I63422 Cerebral infarction due to embolism of left anterior cerebral artery: Secondary | ICD-10-CM | POA: Diagnosis not present

## 2023-06-27 DIAGNOSIS — L03119 Cellulitis of unspecified part of limb: Secondary | ICD-10-CM | POA: Diagnosis not present

## 2023-06-27 DIAGNOSIS — R479 Unspecified speech disturbances: Secondary | ICD-10-CM | POA: Diagnosis not present

## 2023-06-27 DIAGNOSIS — R2681 Unsteadiness on feet: Secondary | ICD-10-CM | POA: Diagnosis not present

## 2023-06-27 DIAGNOSIS — M6281 Muscle weakness (generalized): Secondary | ICD-10-CM | POA: Diagnosis not present

## 2023-06-27 DIAGNOSIS — R41841 Cognitive communication deficit: Secondary | ICD-10-CM | POA: Diagnosis not present

## 2023-06-27 DIAGNOSIS — R4782 Fluency disorder in conditions classified elsewhere: Secondary | ICD-10-CM | POA: Diagnosis not present

## 2023-06-27 DIAGNOSIS — I480 Paroxysmal atrial fibrillation: Secondary | ICD-10-CM | POA: Diagnosis not present

## 2023-06-27 DIAGNOSIS — I63422 Cerebral infarction due to embolism of left anterior cerebral artery: Secondary | ICD-10-CM | POA: Diagnosis not present

## 2023-06-27 DIAGNOSIS — R1310 Dysphagia, unspecified: Secondary | ICD-10-CM | POA: Diagnosis not present

## 2023-06-28 DIAGNOSIS — R2681 Unsteadiness on feet: Secondary | ICD-10-CM | POA: Diagnosis not present

## 2023-06-28 DIAGNOSIS — R4782 Fluency disorder in conditions classified elsewhere: Secondary | ICD-10-CM | POA: Diagnosis not present

## 2023-06-28 DIAGNOSIS — R41841 Cognitive communication deficit: Secondary | ICD-10-CM | POA: Diagnosis not present

## 2023-06-28 DIAGNOSIS — R479 Unspecified speech disturbances: Secondary | ICD-10-CM | POA: Diagnosis not present

## 2023-06-28 DIAGNOSIS — R1310 Dysphagia, unspecified: Secondary | ICD-10-CM | POA: Diagnosis not present

## 2023-06-28 DIAGNOSIS — M6281 Muscle weakness (generalized): Secondary | ICD-10-CM | POA: Diagnosis not present

## 2023-06-28 DIAGNOSIS — I63422 Cerebral infarction due to embolism of left anterior cerebral artery: Secondary | ICD-10-CM | POA: Diagnosis not present

## 2023-06-28 DIAGNOSIS — I480 Paroxysmal atrial fibrillation: Secondary | ICD-10-CM | POA: Diagnosis not present

## 2023-06-28 DIAGNOSIS — L03119 Cellulitis of unspecified part of limb: Secondary | ICD-10-CM | POA: Diagnosis not present

## 2023-07-01 DIAGNOSIS — R479 Unspecified speech disturbances: Secondary | ICD-10-CM | POA: Diagnosis not present

## 2023-07-01 DIAGNOSIS — I480 Paroxysmal atrial fibrillation: Secondary | ICD-10-CM | POA: Diagnosis not present

## 2023-07-01 DIAGNOSIS — F5104 Psychophysiologic insomnia: Secondary | ICD-10-CM | POA: Diagnosis not present

## 2023-07-01 DIAGNOSIS — I5032 Chronic diastolic (congestive) heart failure: Secondary | ICD-10-CM | POA: Diagnosis not present

## 2023-07-01 DIAGNOSIS — I63422 Cerebral infarction due to embolism of left anterior cerebral artery: Secondary | ICD-10-CM | POA: Diagnosis not present

## 2023-07-01 DIAGNOSIS — R1310 Dysphagia, unspecified: Secondary | ICD-10-CM | POA: Diagnosis not present

## 2023-07-01 DIAGNOSIS — R4782 Fluency disorder in conditions classified elsewhere: Secondary | ICD-10-CM | POA: Diagnosis not present

## 2023-07-01 DIAGNOSIS — R5383 Other fatigue: Secondary | ICD-10-CM | POA: Diagnosis not present

## 2023-07-01 DIAGNOSIS — L03119 Cellulitis of unspecified part of limb: Secondary | ICD-10-CM | POA: Diagnosis not present

## 2023-07-01 DIAGNOSIS — R2681 Unsteadiness on feet: Secondary | ICD-10-CM | POA: Diagnosis not present

## 2023-07-01 DIAGNOSIS — M6281 Muscle weakness (generalized): Secondary | ICD-10-CM | POA: Diagnosis not present

## 2023-07-01 DIAGNOSIS — R41841 Cognitive communication deficit: Secondary | ICD-10-CM | POA: Diagnosis not present

## 2023-07-01 DIAGNOSIS — R5381 Other malaise: Secondary | ICD-10-CM | POA: Diagnosis not present

## 2023-07-01 DIAGNOSIS — I2510 Atherosclerotic heart disease of native coronary artery without angina pectoris: Secondary | ICD-10-CM | POA: Diagnosis not present

## 2023-07-02 DIAGNOSIS — I63422 Cerebral infarction due to embolism of left anterior cerebral artery: Secondary | ICD-10-CM | POA: Diagnosis not present

## 2023-07-02 DIAGNOSIS — R41841 Cognitive communication deficit: Secondary | ICD-10-CM | POA: Diagnosis not present

## 2023-07-02 DIAGNOSIS — R1310 Dysphagia, unspecified: Secondary | ICD-10-CM | POA: Diagnosis not present

## 2023-07-02 DIAGNOSIS — R4782 Fluency disorder in conditions classified elsewhere: Secondary | ICD-10-CM | POA: Diagnosis not present

## 2023-07-02 DIAGNOSIS — M6281 Muscle weakness (generalized): Secondary | ICD-10-CM | POA: Diagnosis not present

## 2023-07-02 DIAGNOSIS — R2681 Unsteadiness on feet: Secondary | ICD-10-CM | POA: Diagnosis not present

## 2023-07-02 DIAGNOSIS — I480 Paroxysmal atrial fibrillation: Secondary | ICD-10-CM | POA: Diagnosis not present

## 2023-07-02 DIAGNOSIS — R479 Unspecified speech disturbances: Secondary | ICD-10-CM | POA: Diagnosis not present

## 2023-07-02 DIAGNOSIS — L03119 Cellulitis of unspecified part of limb: Secondary | ICD-10-CM | POA: Diagnosis not present

## 2023-07-03 DIAGNOSIS — R1310 Dysphagia, unspecified: Secondary | ICD-10-CM | POA: Diagnosis not present

## 2023-07-03 DIAGNOSIS — M6281 Muscle weakness (generalized): Secondary | ICD-10-CM | POA: Diagnosis not present

## 2023-07-03 DIAGNOSIS — R479 Unspecified speech disturbances: Secondary | ICD-10-CM | POA: Diagnosis not present

## 2023-07-03 DIAGNOSIS — I63422 Cerebral infarction due to embolism of left anterior cerebral artery: Secondary | ICD-10-CM | POA: Diagnosis not present

## 2023-07-03 DIAGNOSIS — I480 Paroxysmal atrial fibrillation: Secondary | ICD-10-CM | POA: Diagnosis not present

## 2023-07-03 DIAGNOSIS — R4782 Fluency disorder in conditions classified elsewhere: Secondary | ICD-10-CM | POA: Diagnosis not present

## 2023-07-03 DIAGNOSIS — L03119 Cellulitis of unspecified part of limb: Secondary | ICD-10-CM | POA: Diagnosis not present

## 2023-07-03 DIAGNOSIS — R41841 Cognitive communication deficit: Secondary | ICD-10-CM | POA: Diagnosis not present

## 2023-07-03 DIAGNOSIS — R2681 Unsteadiness on feet: Secondary | ICD-10-CM | POA: Diagnosis not present

## 2023-07-04 DIAGNOSIS — R2681 Unsteadiness on feet: Secondary | ICD-10-CM | POA: Diagnosis not present

## 2023-07-04 DIAGNOSIS — I63422 Cerebral infarction due to embolism of left anterior cerebral artery: Secondary | ICD-10-CM | POA: Diagnosis not present

## 2023-07-04 DIAGNOSIS — R41841 Cognitive communication deficit: Secondary | ICD-10-CM | POA: Diagnosis not present

## 2023-07-04 DIAGNOSIS — L03119 Cellulitis of unspecified part of limb: Secondary | ICD-10-CM | POA: Diagnosis not present

## 2023-07-04 DIAGNOSIS — R1310 Dysphagia, unspecified: Secondary | ICD-10-CM | POA: Diagnosis not present

## 2023-07-04 DIAGNOSIS — R4782 Fluency disorder in conditions classified elsewhere: Secondary | ICD-10-CM | POA: Diagnosis not present

## 2023-07-04 DIAGNOSIS — M6281 Muscle weakness (generalized): Secondary | ICD-10-CM | POA: Diagnosis not present

## 2023-07-04 DIAGNOSIS — I480 Paroxysmal atrial fibrillation: Secondary | ICD-10-CM | POA: Diagnosis not present

## 2023-07-04 DIAGNOSIS — R479 Unspecified speech disturbances: Secondary | ICD-10-CM | POA: Diagnosis not present

## 2023-07-05 DIAGNOSIS — R2681 Unsteadiness on feet: Secondary | ICD-10-CM | POA: Diagnosis not present

## 2023-07-05 DIAGNOSIS — L03119 Cellulitis of unspecified part of limb: Secondary | ICD-10-CM | POA: Diagnosis not present

## 2023-07-05 DIAGNOSIS — I480 Paroxysmal atrial fibrillation: Secondary | ICD-10-CM | POA: Diagnosis not present

## 2023-07-05 DIAGNOSIS — R4782 Fluency disorder in conditions classified elsewhere: Secondary | ICD-10-CM | POA: Diagnosis not present

## 2023-07-05 DIAGNOSIS — R41841 Cognitive communication deficit: Secondary | ICD-10-CM | POA: Diagnosis not present

## 2023-07-05 DIAGNOSIS — E889 Metabolic disorder, unspecified: Secondary | ICD-10-CM | POA: Diagnosis not present

## 2023-07-05 DIAGNOSIS — R479 Unspecified speech disturbances: Secondary | ICD-10-CM | POA: Diagnosis not present

## 2023-07-05 DIAGNOSIS — R1310 Dysphagia, unspecified: Secondary | ICD-10-CM | POA: Diagnosis not present

## 2023-07-05 DIAGNOSIS — M6281 Muscle weakness (generalized): Secondary | ICD-10-CM | POA: Diagnosis not present

## 2023-07-05 DIAGNOSIS — I63422 Cerebral infarction due to embolism of left anterior cerebral artery: Secondary | ICD-10-CM | POA: Diagnosis not present

## 2023-07-06 DIAGNOSIS — M545 Low back pain, unspecified: Secondary | ICD-10-CM | POA: Diagnosis not present

## 2023-07-08 DIAGNOSIS — R4782 Fluency disorder in conditions classified elsewhere: Secondary | ICD-10-CM | POA: Diagnosis not present

## 2023-07-08 DIAGNOSIS — R479 Unspecified speech disturbances: Secondary | ICD-10-CM | POA: Diagnosis not present

## 2023-07-08 DIAGNOSIS — I480 Paroxysmal atrial fibrillation: Secondary | ICD-10-CM | POA: Diagnosis not present

## 2023-07-08 DIAGNOSIS — M6281 Muscle weakness (generalized): Secondary | ICD-10-CM | POA: Diagnosis not present

## 2023-07-08 DIAGNOSIS — R2681 Unsteadiness on feet: Secondary | ICD-10-CM | POA: Diagnosis not present

## 2023-07-08 DIAGNOSIS — R1310 Dysphagia, unspecified: Secondary | ICD-10-CM | POA: Diagnosis not present

## 2023-07-08 DIAGNOSIS — R41841 Cognitive communication deficit: Secondary | ICD-10-CM | POA: Diagnosis not present

## 2023-07-08 DIAGNOSIS — I63422 Cerebral infarction due to embolism of left anterior cerebral artery: Secondary | ICD-10-CM | POA: Diagnosis not present

## 2023-07-08 DIAGNOSIS — L03119 Cellulitis of unspecified part of limb: Secondary | ICD-10-CM | POA: Diagnosis not present

## 2023-07-09 DIAGNOSIS — R1310 Dysphagia, unspecified: Secondary | ICD-10-CM | POA: Diagnosis not present

## 2023-07-09 DIAGNOSIS — L03119 Cellulitis of unspecified part of limb: Secondary | ICD-10-CM | POA: Diagnosis not present

## 2023-07-09 DIAGNOSIS — I480 Paroxysmal atrial fibrillation: Secondary | ICD-10-CM | POA: Diagnosis not present

## 2023-07-09 DIAGNOSIS — R479 Unspecified speech disturbances: Secondary | ICD-10-CM | POA: Diagnosis not present

## 2023-07-09 DIAGNOSIS — R2681 Unsteadiness on feet: Secondary | ICD-10-CM | POA: Diagnosis not present

## 2023-07-09 DIAGNOSIS — R41841 Cognitive communication deficit: Secondary | ICD-10-CM | POA: Diagnosis not present

## 2023-07-09 DIAGNOSIS — I63422 Cerebral infarction due to embolism of left anterior cerebral artery: Secondary | ICD-10-CM | POA: Diagnosis not present

## 2023-07-09 DIAGNOSIS — M6281 Muscle weakness (generalized): Secondary | ICD-10-CM | POA: Diagnosis not present

## 2023-07-09 DIAGNOSIS — R4782 Fluency disorder in conditions classified elsewhere: Secondary | ICD-10-CM | POA: Diagnosis not present

## 2023-07-10 DIAGNOSIS — R2681 Unsteadiness on feet: Secondary | ICD-10-CM | POA: Diagnosis not present

## 2023-07-10 DIAGNOSIS — R1310 Dysphagia, unspecified: Secondary | ICD-10-CM | POA: Diagnosis not present

## 2023-07-10 DIAGNOSIS — L03119 Cellulitis of unspecified part of limb: Secondary | ICD-10-CM | POA: Diagnosis not present

## 2023-07-10 DIAGNOSIS — R4782 Fluency disorder in conditions classified elsewhere: Secondary | ICD-10-CM | POA: Diagnosis not present

## 2023-07-10 DIAGNOSIS — R479 Unspecified speech disturbances: Secondary | ICD-10-CM | POA: Diagnosis not present

## 2023-07-10 DIAGNOSIS — R41841 Cognitive communication deficit: Secondary | ICD-10-CM | POA: Diagnosis not present

## 2023-07-10 DIAGNOSIS — I63422 Cerebral infarction due to embolism of left anterior cerebral artery: Secondary | ICD-10-CM | POA: Diagnosis not present

## 2023-07-10 DIAGNOSIS — M6281 Muscle weakness (generalized): Secondary | ICD-10-CM | POA: Diagnosis not present

## 2023-07-10 DIAGNOSIS — M549 Dorsalgia, unspecified: Secondary | ICD-10-CM | POA: Diagnosis not present

## 2023-07-10 DIAGNOSIS — I480 Paroxysmal atrial fibrillation: Secondary | ICD-10-CM | POA: Diagnosis not present

## 2023-07-11 DIAGNOSIS — R41841 Cognitive communication deficit: Secondary | ICD-10-CM | POA: Diagnosis not present

## 2023-07-11 DIAGNOSIS — R4782 Fluency disorder in conditions classified elsewhere: Secondary | ICD-10-CM | POA: Diagnosis not present

## 2023-07-11 DIAGNOSIS — M6281 Muscle weakness (generalized): Secondary | ICD-10-CM | POA: Diagnosis not present

## 2023-07-11 DIAGNOSIS — R1310 Dysphagia, unspecified: Secondary | ICD-10-CM | POA: Diagnosis not present

## 2023-07-11 DIAGNOSIS — R2681 Unsteadiness on feet: Secondary | ICD-10-CM | POA: Diagnosis not present

## 2023-07-11 DIAGNOSIS — L03119 Cellulitis of unspecified part of limb: Secondary | ICD-10-CM | POA: Diagnosis not present

## 2023-07-11 DIAGNOSIS — R479 Unspecified speech disturbances: Secondary | ICD-10-CM | POA: Diagnosis not present

## 2023-07-11 DIAGNOSIS — I480 Paroxysmal atrial fibrillation: Secondary | ICD-10-CM | POA: Diagnosis not present

## 2023-07-11 DIAGNOSIS — I63422 Cerebral infarction due to embolism of left anterior cerebral artery: Secondary | ICD-10-CM | POA: Diagnosis not present

## 2023-07-12 DIAGNOSIS — L03119 Cellulitis of unspecified part of limb: Secondary | ICD-10-CM | POA: Diagnosis not present

## 2023-07-12 DIAGNOSIS — M6281 Muscle weakness (generalized): Secondary | ICD-10-CM | POA: Diagnosis not present

## 2023-07-12 DIAGNOSIS — I480 Paroxysmal atrial fibrillation: Secondary | ICD-10-CM | POA: Diagnosis not present

## 2023-07-12 DIAGNOSIS — R1310 Dysphagia, unspecified: Secondary | ICD-10-CM | POA: Diagnosis not present

## 2023-07-12 DIAGNOSIS — R41841 Cognitive communication deficit: Secondary | ICD-10-CM | POA: Diagnosis not present

## 2023-07-12 DIAGNOSIS — R2681 Unsteadiness on feet: Secondary | ICD-10-CM | POA: Diagnosis not present

## 2023-07-12 DIAGNOSIS — I63422 Cerebral infarction due to embolism of left anterior cerebral artery: Secondary | ICD-10-CM | POA: Diagnosis not present

## 2023-07-12 DIAGNOSIS — R479 Unspecified speech disturbances: Secondary | ICD-10-CM | POA: Diagnosis not present

## 2023-07-12 DIAGNOSIS — R4782 Fluency disorder in conditions classified elsewhere: Secondary | ICD-10-CM | POA: Diagnosis not present

## 2023-07-15 DIAGNOSIS — I63422 Cerebral infarction due to embolism of left anterior cerebral artery: Secondary | ICD-10-CM | POA: Diagnosis not present

## 2023-07-15 DIAGNOSIS — R1310 Dysphagia, unspecified: Secondary | ICD-10-CM | POA: Diagnosis not present

## 2023-07-15 DIAGNOSIS — I480 Paroxysmal atrial fibrillation: Secondary | ICD-10-CM | POA: Diagnosis not present

## 2023-07-15 DIAGNOSIS — M6281 Muscle weakness (generalized): Secondary | ICD-10-CM | POA: Diagnosis not present

## 2023-07-15 DIAGNOSIS — L03119 Cellulitis of unspecified part of limb: Secondary | ICD-10-CM | POA: Diagnosis not present

## 2023-07-15 DIAGNOSIS — R4782 Fluency disorder in conditions classified elsewhere: Secondary | ICD-10-CM | POA: Diagnosis not present

## 2023-07-15 DIAGNOSIS — R2681 Unsteadiness on feet: Secondary | ICD-10-CM | POA: Diagnosis not present

## 2023-07-15 DIAGNOSIS — R479 Unspecified speech disturbances: Secondary | ICD-10-CM | POA: Diagnosis not present

## 2023-07-15 DIAGNOSIS — R41841 Cognitive communication deficit: Secondary | ICD-10-CM | POA: Diagnosis not present

## 2023-07-16 DIAGNOSIS — M25561 Pain in right knee: Secondary | ICD-10-CM | POA: Diagnosis not present

## 2023-07-16 DIAGNOSIS — R1310 Dysphagia, unspecified: Secondary | ICD-10-CM | POA: Diagnosis not present

## 2023-07-16 DIAGNOSIS — R479 Unspecified speech disturbances: Secondary | ICD-10-CM | POA: Diagnosis not present

## 2023-07-16 DIAGNOSIS — I63422 Cerebral infarction due to embolism of left anterior cerebral artery: Secondary | ICD-10-CM | POA: Diagnosis not present

## 2023-07-16 DIAGNOSIS — R2681 Unsteadiness on feet: Secondary | ICD-10-CM | POA: Diagnosis not present

## 2023-07-16 DIAGNOSIS — R41841 Cognitive communication deficit: Secondary | ICD-10-CM | POA: Diagnosis not present

## 2023-07-16 DIAGNOSIS — I480 Paroxysmal atrial fibrillation: Secondary | ICD-10-CM | POA: Diagnosis not present

## 2023-07-16 DIAGNOSIS — L03119 Cellulitis of unspecified part of limb: Secondary | ICD-10-CM | POA: Diagnosis not present

## 2023-07-16 DIAGNOSIS — R4782 Fluency disorder in conditions classified elsewhere: Secondary | ICD-10-CM | POA: Diagnosis not present

## 2023-07-16 DIAGNOSIS — M6281 Muscle weakness (generalized): Secondary | ICD-10-CM | POA: Diagnosis not present

## 2023-07-17 DIAGNOSIS — R1310 Dysphagia, unspecified: Secondary | ICD-10-CM | POA: Diagnosis not present

## 2023-07-17 DIAGNOSIS — I480 Paroxysmal atrial fibrillation: Secondary | ICD-10-CM | POA: Diagnosis not present

## 2023-07-17 DIAGNOSIS — R479 Unspecified speech disturbances: Secondary | ICD-10-CM | POA: Diagnosis not present

## 2023-07-17 DIAGNOSIS — L03119 Cellulitis of unspecified part of limb: Secondary | ICD-10-CM | POA: Diagnosis not present

## 2023-07-17 DIAGNOSIS — M6281 Muscle weakness (generalized): Secondary | ICD-10-CM | POA: Diagnosis not present

## 2023-07-17 DIAGNOSIS — R4782 Fluency disorder in conditions classified elsewhere: Secondary | ICD-10-CM | POA: Diagnosis not present

## 2023-07-17 DIAGNOSIS — R41841 Cognitive communication deficit: Secondary | ICD-10-CM | POA: Diagnosis not present

## 2023-07-17 DIAGNOSIS — R2681 Unsteadiness on feet: Secondary | ICD-10-CM | POA: Diagnosis not present

## 2023-07-17 DIAGNOSIS — I63422 Cerebral infarction due to embolism of left anterior cerebral artery: Secondary | ICD-10-CM | POA: Diagnosis not present

## 2023-07-18 DIAGNOSIS — I7091 Generalized atherosclerosis: Secondary | ICD-10-CM | POA: Diagnosis not present

## 2023-07-18 DIAGNOSIS — I480 Paroxysmal atrial fibrillation: Secondary | ICD-10-CM | POA: Diagnosis not present

## 2023-07-18 DIAGNOSIS — R4782 Fluency disorder in conditions classified elsewhere: Secondary | ICD-10-CM | POA: Diagnosis not present

## 2023-07-18 DIAGNOSIS — B351 Tinea unguium: Secondary | ICD-10-CM | POA: Diagnosis not present

## 2023-07-18 DIAGNOSIS — R1310 Dysphagia, unspecified: Secondary | ICD-10-CM | POA: Diagnosis not present

## 2023-07-18 DIAGNOSIS — I63422 Cerebral infarction due to embolism of left anterior cerebral artery: Secondary | ICD-10-CM | POA: Diagnosis not present

## 2023-07-18 DIAGNOSIS — R479 Unspecified speech disturbances: Secondary | ICD-10-CM | POA: Diagnosis not present

## 2023-07-18 DIAGNOSIS — L03119 Cellulitis of unspecified part of limb: Secondary | ICD-10-CM | POA: Diagnosis not present

## 2023-07-18 DIAGNOSIS — M6281 Muscle weakness (generalized): Secondary | ICD-10-CM | POA: Diagnosis not present

## 2023-07-18 DIAGNOSIS — R2681 Unsteadiness on feet: Secondary | ICD-10-CM | POA: Diagnosis not present

## 2023-07-18 DIAGNOSIS — R41841 Cognitive communication deficit: Secondary | ICD-10-CM | POA: Diagnosis not present

## 2023-07-19 DIAGNOSIS — S83401A Sprain of unspecified collateral ligament of right knee, initial encounter: Secondary | ICD-10-CM | POA: Diagnosis not present

## 2023-07-22 DIAGNOSIS — M6281 Muscle weakness (generalized): Secondary | ICD-10-CM | POA: Diagnosis not present

## 2023-07-22 DIAGNOSIS — R4782 Fluency disorder in conditions classified elsewhere: Secondary | ICD-10-CM | POA: Diagnosis not present

## 2023-07-22 DIAGNOSIS — L03119 Cellulitis of unspecified part of limb: Secondary | ICD-10-CM | POA: Diagnosis not present

## 2023-07-22 DIAGNOSIS — R41841 Cognitive communication deficit: Secondary | ICD-10-CM | POA: Diagnosis not present

## 2023-07-22 DIAGNOSIS — R1310 Dysphagia, unspecified: Secondary | ICD-10-CM | POA: Diagnosis not present

## 2023-07-22 DIAGNOSIS — R2681 Unsteadiness on feet: Secondary | ICD-10-CM | POA: Diagnosis not present

## 2023-07-22 DIAGNOSIS — R479 Unspecified speech disturbances: Secondary | ICD-10-CM | POA: Diagnosis not present

## 2023-07-22 DIAGNOSIS — I480 Paroxysmal atrial fibrillation: Secondary | ICD-10-CM | POA: Diagnosis not present

## 2023-07-22 DIAGNOSIS — I63422 Cerebral infarction due to embolism of left anterior cerebral artery: Secondary | ICD-10-CM | POA: Diagnosis not present

## 2023-07-23 DIAGNOSIS — M6281 Muscle weakness (generalized): Secondary | ICD-10-CM | POA: Diagnosis not present

## 2023-07-23 DIAGNOSIS — R4782 Fluency disorder in conditions classified elsewhere: Secondary | ICD-10-CM | POA: Diagnosis not present

## 2023-07-23 DIAGNOSIS — I63422 Cerebral infarction due to embolism of left anterior cerebral artery: Secondary | ICD-10-CM | POA: Diagnosis not present

## 2023-07-23 DIAGNOSIS — R2681 Unsteadiness on feet: Secondary | ICD-10-CM | POA: Diagnosis not present

## 2023-07-23 DIAGNOSIS — R1310 Dysphagia, unspecified: Secondary | ICD-10-CM | POA: Diagnosis not present

## 2023-07-23 DIAGNOSIS — R41841 Cognitive communication deficit: Secondary | ICD-10-CM | POA: Diagnosis not present

## 2023-07-23 DIAGNOSIS — I480 Paroxysmal atrial fibrillation: Secondary | ICD-10-CM | POA: Diagnosis not present

## 2023-07-23 DIAGNOSIS — R479 Unspecified speech disturbances: Secondary | ICD-10-CM | POA: Diagnosis not present

## 2023-07-23 DIAGNOSIS — L03119 Cellulitis of unspecified part of limb: Secondary | ICD-10-CM | POA: Diagnosis not present

## 2023-07-24 DIAGNOSIS — R4782 Fluency disorder in conditions classified elsewhere: Secondary | ICD-10-CM | POA: Diagnosis not present

## 2023-07-24 DIAGNOSIS — R1310 Dysphagia, unspecified: Secondary | ICD-10-CM | POA: Diagnosis not present

## 2023-07-24 DIAGNOSIS — I63422 Cerebral infarction due to embolism of left anterior cerebral artery: Secondary | ICD-10-CM | POA: Diagnosis not present

## 2023-07-24 DIAGNOSIS — L03119 Cellulitis of unspecified part of limb: Secondary | ICD-10-CM | POA: Diagnosis not present

## 2023-07-24 DIAGNOSIS — R479 Unspecified speech disturbances: Secondary | ICD-10-CM | POA: Diagnosis not present

## 2023-07-24 DIAGNOSIS — R2681 Unsteadiness on feet: Secondary | ICD-10-CM | POA: Diagnosis not present

## 2023-07-24 DIAGNOSIS — R41841 Cognitive communication deficit: Secondary | ICD-10-CM | POA: Diagnosis not present

## 2023-07-24 DIAGNOSIS — M6281 Muscle weakness (generalized): Secondary | ICD-10-CM | POA: Diagnosis not present

## 2023-07-24 DIAGNOSIS — I480 Paroxysmal atrial fibrillation: Secondary | ICD-10-CM | POA: Diagnosis not present

## 2023-07-25 DIAGNOSIS — R41841 Cognitive communication deficit: Secondary | ICD-10-CM | POA: Diagnosis not present

## 2023-07-25 DIAGNOSIS — L03119 Cellulitis of unspecified part of limb: Secondary | ICD-10-CM | POA: Diagnosis not present

## 2023-07-25 DIAGNOSIS — R2681 Unsteadiness on feet: Secondary | ICD-10-CM | POA: Diagnosis not present

## 2023-07-25 DIAGNOSIS — I480 Paroxysmal atrial fibrillation: Secondary | ICD-10-CM | POA: Diagnosis not present

## 2023-07-25 DIAGNOSIS — R479 Unspecified speech disturbances: Secondary | ICD-10-CM | POA: Diagnosis not present

## 2023-07-25 DIAGNOSIS — R4782 Fluency disorder in conditions classified elsewhere: Secondary | ICD-10-CM | POA: Diagnosis not present

## 2023-07-25 DIAGNOSIS — I63422 Cerebral infarction due to embolism of left anterior cerebral artery: Secondary | ICD-10-CM | POA: Diagnosis not present

## 2023-07-25 DIAGNOSIS — M6281 Muscle weakness (generalized): Secondary | ICD-10-CM | POA: Diagnosis not present

## 2023-07-25 DIAGNOSIS — R1310 Dysphagia, unspecified: Secondary | ICD-10-CM | POA: Diagnosis not present

## 2023-07-28 ENCOUNTER — Emergency Department: Payer: Medicare HMO

## 2023-07-28 ENCOUNTER — Other Ambulatory Visit: Payer: Self-pay

## 2023-07-28 ENCOUNTER — Inpatient Hospital Stay
Admission: EM | Admit: 2023-07-28 | Discharge: 2023-07-30 | DRG: 193 | Disposition: A | Discharge status: Skilled Nursing Facility | Payer: Medicare HMO | Source: Skilled Nursing Facility | Attending: Student in an Organized Health Care Education/Training Program | Admitting: Student in an Organized Health Care Education/Training Program

## 2023-07-28 DIAGNOSIS — M1712 Unilateral primary osteoarthritis, left knee: Secondary | ICD-10-CM | POA: Diagnosis present

## 2023-07-28 DIAGNOSIS — Z66 Do not resuscitate: Secondary | ICD-10-CM | POA: Diagnosis present

## 2023-07-28 DIAGNOSIS — R0902 Hypoxemia: Secondary | ICD-10-CM | POA: Diagnosis not present

## 2023-07-28 DIAGNOSIS — I480 Paroxysmal atrial fibrillation: Secondary | ICD-10-CM | POA: Diagnosis not present

## 2023-07-28 DIAGNOSIS — R0602 Shortness of breath: Secondary | ICD-10-CM | POA: Diagnosis not present

## 2023-07-28 DIAGNOSIS — J40 Bronchitis, not specified as acute or chronic: Secondary | ICD-10-CM | POA: Diagnosis not present

## 2023-07-28 DIAGNOSIS — N179 Acute kidney failure, unspecified: Secondary | ICD-10-CM | POA: Diagnosis not present

## 2023-07-28 DIAGNOSIS — Z7901 Long term (current) use of anticoagulants: Secondary | ICD-10-CM

## 2023-07-28 DIAGNOSIS — I10 Essential (primary) hypertension: Secondary | ICD-10-CM | POA: Diagnosis not present

## 2023-07-28 DIAGNOSIS — Z8249 Family history of ischemic heart disease and other diseases of the circulatory system: Secondary | ICD-10-CM | POA: Diagnosis not present

## 2023-07-28 DIAGNOSIS — S2241XD Multiple fractures of ribs, right side, subsequent encounter for fracture with routine healing: Secondary | ICD-10-CM | POA: Diagnosis not present

## 2023-07-28 DIAGNOSIS — Z79899 Other long term (current) drug therapy: Secondary | ICD-10-CM | POA: Diagnosis not present

## 2023-07-28 DIAGNOSIS — Z96652 Presence of left artificial knee joint: Secondary | ICD-10-CM

## 2023-07-28 DIAGNOSIS — G4700 Insomnia, unspecified: Secondary | ICD-10-CM | POA: Diagnosis present

## 2023-07-28 DIAGNOSIS — R918 Other nonspecific abnormal finding of lung field: Secondary | ICD-10-CM | POA: Diagnosis not present

## 2023-07-28 DIAGNOSIS — Z1152 Encounter for screening for COVID-19: Secondary | ICD-10-CM | POA: Diagnosis not present

## 2023-07-28 DIAGNOSIS — Z8673 Personal history of transient ischemic attack (TIA), and cerebral infarction without residual deficits: Secondary | ICD-10-CM

## 2023-07-28 DIAGNOSIS — J189 Pneumonia, unspecified organism: Principal | ICD-10-CM

## 2023-07-28 DIAGNOSIS — Z800 Family history of malignant neoplasm of digestive organs: Secondary | ICD-10-CM | POA: Diagnosis not present

## 2023-07-28 DIAGNOSIS — I129 Hypertensive chronic kidney disease with stage 1 through stage 4 chronic kidney disease, or unspecified chronic kidney disease: Secondary | ICD-10-CM | POA: Diagnosis present

## 2023-07-28 DIAGNOSIS — G4733 Obstructive sleep apnea (adult) (pediatric): Secondary | ICD-10-CM | POA: Diagnosis present

## 2023-07-28 DIAGNOSIS — R6883 Chills (without fever): Secondary | ICD-10-CM | POA: Diagnosis not present

## 2023-07-28 DIAGNOSIS — R000 Tachycardia, unspecified: Secondary | ICD-10-CM | POA: Diagnosis not present

## 2023-07-28 DIAGNOSIS — J9601 Acute respiratory failure with hypoxia: Secondary | ICD-10-CM | POA: Diagnosis present

## 2023-07-28 DIAGNOSIS — R509 Fever, unspecified: Secondary | ICD-10-CM

## 2023-07-28 DIAGNOSIS — N1831 Chronic kidney disease, stage 3a: Secondary | ICD-10-CM | POA: Insufficient documentation

## 2023-07-28 DIAGNOSIS — Z87442 Personal history of urinary calculi: Secondary | ICD-10-CM

## 2023-07-28 DIAGNOSIS — F418 Other specified anxiety disorders: Secondary | ICD-10-CM | POA: Diagnosis not present

## 2023-07-28 DIAGNOSIS — E782 Mixed hyperlipidemia: Secondary | ICD-10-CM | POA: Diagnosis present

## 2023-07-28 DIAGNOSIS — Z87891 Personal history of nicotine dependence: Secondary | ICD-10-CM | POA: Diagnosis not present

## 2023-07-28 DIAGNOSIS — R0689 Other abnormalities of breathing: Secondary | ICD-10-CM | POA: Diagnosis not present

## 2023-07-28 DIAGNOSIS — Z9079 Acquired absence of other genital organ(s): Secondary | ICD-10-CM | POA: Diagnosis not present

## 2023-07-28 DIAGNOSIS — N419 Inflammatory disease of prostate, unspecified: Secondary | ICD-10-CM | POA: Diagnosis present

## 2023-07-28 DIAGNOSIS — R0989 Other specified symptoms and signs involving the circulatory and respiratory systems: Secondary | ICD-10-CM | POA: Diagnosis not present

## 2023-07-28 DIAGNOSIS — R911 Solitary pulmonary nodule: Secondary | ICD-10-CM | POA: Diagnosis not present

## 2023-07-28 DIAGNOSIS — Z823 Family history of stroke: Secondary | ICD-10-CM

## 2023-07-28 DIAGNOSIS — R001 Bradycardia, unspecified: Secondary | ICD-10-CM | POA: Diagnosis not present

## 2023-07-28 HISTORY — DX: Cerebral infarction, unspecified: I63.9

## 2023-07-28 LAB — BASIC METABOLIC PANEL
Anion gap: 12 (ref 5–15)
BUN: 41 mg/dL — ABNORMAL HIGH (ref 8–23)
CO2: 29 mmol/L (ref 22–32)
Calcium: 8.7 mg/dL — ABNORMAL LOW (ref 8.9–10.3)
Chloride: 96 mmol/L — ABNORMAL LOW (ref 98–111)
Creatinine, Ser: 1.88 mg/dL — ABNORMAL HIGH (ref 0.61–1.24)
GFR, Estimated: 33 mL/min — ABNORMAL LOW (ref >60)
Glucose, Bld: 130 mg/dL — ABNORMAL HIGH (ref 70–99)
Potassium: 4.1 mmol/L (ref 3.5–5.1)
Sodium: 137 mmol/L (ref 135–145)

## 2023-07-28 LAB — CBC
HCT: 37.8 % — ABNORMAL LOW (ref 39.0–52.0)
Hemoglobin: 12.2 g/dL — ABNORMAL LOW (ref 13.0–17.0)
MCH: 30.3 pg (ref 26.0–34.0)
MCHC: 32.3 g/dL (ref 30.0–36.0)
MCV: 93.8 fL (ref 80.0–100.0)
Platelets: 178 10*3/uL (ref 150–400)
RBC: 4.03 MIL/uL — ABNORMAL LOW (ref 4.22–5.81)
RDW: 13.3 % (ref 11.5–15.5)
WBC: 13.3 10*3/uL — ABNORMAL HIGH (ref 4.0–10.5)
nRBC: 0 % (ref 0.0–0.2)

## 2023-07-28 LAB — RESP PANEL BY RT-PCR (RSV, FLU A&B, COVID)  RVPGX2
Influenza A by PCR: NEGATIVE
Influenza B by PCR: NEGATIVE
Resp Syncytial Virus by PCR: NEGATIVE
SARS Coronavirus 2 by RT PCR: NEGATIVE

## 2023-07-28 LAB — PROTIME-INR
INR: 1.2 (ref 0.8–1.2)
Prothrombin Time: 15.6 s — ABNORMAL HIGH (ref 11.4–15.2)

## 2023-07-28 LAB — PROCALCITONIN: Procalcitonin: 0.32 ng/mL

## 2023-07-28 LAB — LACTIC ACID, PLASMA
Lactic Acid, Venous: 1.4 mmol/L (ref 0.5–1.9)
Lactic Acid, Venous: 1.7 mmol/L (ref 0.5–1.9)

## 2023-07-28 LAB — APTT: aPTT: 42 s — ABNORMAL HIGH (ref 24–36)

## 2023-07-28 MED ORDER — NYSTATIN 100000 UNIT/GM EX CREA
1.0000 | TOPICAL_CREAM | Freq: Two times a day (BID) | CUTANEOUS | Status: DC | PRN
Start: 2023-07-28 — End: 2023-07-30

## 2023-07-28 MED ORDER — VERAPAMIL HCL ER 120 MG PO TBCR
360.0000 mg | EXTENDED_RELEASE_TABLET | Freq: Every day | ORAL | Status: DC
  Administered 2023-07-28 – 2023-07-29 (×2): 360 mg via ORAL
  Filled 2023-07-28 (×3): qty 3

## 2023-07-28 MED ORDER — PIPERACILLIN-TAZOBACTAM 3.375 G IVPB
3.3750 g | Freq: Three times a day (TID) | INTRAVENOUS | Status: DC
  Filled 2023-07-28: qty 50

## 2023-07-28 MED ORDER — POLYETHYLENE GLYCOL 3350 17 G PO PACK: 17.0000 g | PACK | Freq: Two times a day (BID) | ORAL | Status: DC | PRN

## 2023-07-28 MED ORDER — VITAMIN D 25 MCG (1000 UNIT) PO TABS
1000.0000 [IU] | ORAL_TABLET | Freq: Every day | ORAL | Status: DC
  Administered 2023-07-29 – 2023-07-30 (×2): 1000 [IU] via ORAL
  Filled 2023-07-28 (×2): qty 1

## 2023-07-28 MED ORDER — GABAPENTIN 100 MG PO CAPS
200.0000 mg | ORAL_CAPSULE | Freq: Every day | ORAL | Status: DC
  Administered 2023-07-28 – 2023-07-29 (×2): 200 mg via ORAL
  Filled 2023-07-28 (×2): qty 2

## 2023-07-28 MED ORDER — ONDANSETRON HCL 4 MG/2ML IJ SOLN: 4.0000 mg | Freq: Four times a day (QID) | INTRAMUSCULAR | Status: DC | PRN

## 2023-07-28 MED ORDER — DOCUSATE SODIUM 100 MG PO CAPS
100.0000 mg | ORAL_CAPSULE | Freq: Two times a day (BID) | ORAL | Status: DC
  Administered 2023-07-28 – 2023-07-30 (×4): 100 mg via ORAL
  Filled 2023-07-28 (×4): qty 1

## 2023-07-28 MED ORDER — FERROUS SULFATE 325 (65 FE) MG PO TABS
325.0000 mg | ORAL_TABLET | ORAL | Status: DC
  Administered 2023-07-30: 325 mg via ORAL
  Filled 2023-07-28: qty 1

## 2023-07-28 MED ORDER — BISACODYL 10 MG RE SUPP: 10.0000 mg | Freq: Every day | RECTAL | Status: DC | PRN

## 2023-07-28 MED ORDER — ACETAMINOPHEN 325 MG PO TABS
650.0000 mg | ORAL_TABLET | Freq: Four times a day (QID) | ORAL | Status: DC | PRN
  Administered 2023-07-29: 650 mg via ORAL
  Filled 2023-07-28: qty 2

## 2023-07-28 MED ORDER — ATORVASTATIN CALCIUM 20 MG PO TABS
40.0000 mg | ORAL_TABLET | Freq: Every day | ORAL | Status: DC
  Administered 2023-07-28 – 2023-07-29 (×2): 40 mg via ORAL
  Filled 2023-07-28 (×2): qty 2

## 2023-07-28 MED ORDER — LACTATED RINGERS IV SOLN
150.0000 mL/h | INTRAVENOUS | Status: DC
  Administered 2023-07-28 – 2023-07-29 (×2): 150 mL/h via INTRAVENOUS

## 2023-07-28 MED ORDER — ACETAMINOPHEN 650 MG RE SUPP: 650.0000 mg | Freq: Four times a day (QID) | RECTAL | Status: DC | PRN

## 2023-07-28 MED ORDER — TRAZODONE HCL 50 MG PO TABS
50.0000 mg | ORAL_TABLET | Freq: Every evening | ORAL | Status: DC | PRN
  Administered 2023-07-28 – 2023-07-29 (×2): 50 mg via ORAL
  Filled 2023-07-28 (×2): qty 1

## 2023-07-28 MED ORDER — LEVOFLOXACIN 500 MG PO TABS
750.0000 mg | ORAL_TABLET | Freq: Once | ORAL | Status: AC
  Administered 2023-07-28: 750 mg via ORAL
  Filled 2023-07-28: qty 2

## 2023-07-28 MED ORDER — APIXABAN 2.5 MG PO TABS
2.5000 mg | ORAL_TABLET | Freq: Two times a day (BID) | ORAL | Status: DC
  Administered 2023-07-28 – 2023-07-30 (×4): 2.5 mg via ORAL
  Filled 2023-07-28 (×5): qty 1

## 2023-07-28 MED ORDER — LACTATED RINGERS IV BOLUS (SEPSIS)
1000.0000 mL | Freq: Once | INTRAVENOUS | Status: AC
  Administered 2023-07-28: 1000 mL via INTRAVENOUS

## 2023-07-28 MED ORDER — LEVOFLOXACIN 750 MG PO TABS: 750.0000 mg | ORAL_TABLET | Freq: Every day | ORAL | 0 refills | Status: AC

## 2023-07-28 MED ORDER — HEPARIN SODIUM (PORCINE) 5000 UNIT/ML IJ SOLN: 5000.0000 [IU] | Freq: Three times a day (TID) | INTRAMUSCULAR | Status: DC

## 2023-07-28 MED ORDER — METOPROLOL TARTRATE 25 MG PO TABS
12.5000 mg | ORAL_TABLET | Freq: Two times a day (BID) | ORAL | Status: DC
  Administered 2023-07-28 – 2023-07-30 (×4): 12.5 mg via ORAL
  Filled 2023-07-28 (×4): qty 1

## 2023-07-28 MED ORDER — SODIUM CHLORIDE 0.9 % IV SOLN
500.0000 mg | INTRAVENOUS | Status: DC
  Administered 2023-07-28: 500 mg via INTRAVENOUS
  Filled 2023-07-28 (×2): qty 5

## 2023-07-28 MED ORDER — TRAZODONE HCL 100 MG PO TABS: 200.0000 mg | ORAL_TABLET | Freq: Every day | ORAL | Status: DC

## 2023-07-28 MED ORDER — HYDRALAZINE HCL 20 MG/ML IJ SOLN: 5.0000 mg | Freq: Four times a day (QID) | INTRAMUSCULAR | Status: DC | PRN

## 2023-07-28 MED ORDER — TRAZODONE HCL 50 MG PO TABS
150.0000 mg | ORAL_TABLET | Freq: Every evening | ORAL | Status: DC | PRN
  Administered 2023-07-28 – 2023-07-29 (×2): 150 mg via ORAL
  Filled 2023-07-28 (×2): qty 3

## 2023-07-28 MED ORDER — SERTRALINE HCL 50 MG PO TABS
25.0000 mg | ORAL_TABLET | Freq: Every day | ORAL | Status: DC
  Administered 2023-07-28 – 2023-07-29 (×2): 25 mg via ORAL
  Filled 2023-07-28 (×2): qty 1

## 2023-07-28 MED ORDER — GUAIFENESIN 100 MG/5ML PO LIQD
5.0000 mL | Freq: Four times a day (QID) | ORAL | Status: DC | PRN
  Administered 2023-07-28 – 2023-07-30 (×3): 5 mL via ORAL
  Filled 2023-07-28 (×3): qty 10

## 2023-07-28 MED ORDER — ONDANSETRON HCL 4 MG PO TABS: 4.0000 mg | ORAL_TABLET | Freq: Four times a day (QID) | ORAL | Status: DC | PRN

## 2023-07-28 MED ORDER — SENNOSIDES-DOCUSATE SODIUM 8.6-50 MG PO TABS: 1.0000 | ORAL_TABLET | Freq: Every evening | ORAL | Status: DC | PRN

## 2023-07-28 MED ORDER — SODIUM CHLORIDE 0.9 % IV SOLN: 3.0000 g | Freq: Two times a day (BID) | INTRAVENOUS | Status: DC

## 2023-07-28 NOTE — ED Triage Notes (Addendum)
Pt comes via EMS from [facility]. EMS called out for tremors and fever. Pt states tremors about hour ago. Pt states he feels bad. Pt states when he takes a deep breath it hurts.   T-98.6 BP-156/80 HR-100  Pt also states cough present for about two weeks.

## 2023-07-28 NOTE — Plan of Care (Signed)

## 2023-07-28 NOTE — Assessment & Plan Note (Addendum)
With acute hypoxic respiratory failure with exertion/ambulation Unasyn for aspiration pneumonia coverage and azithromycin for atypical pneumonia coverage Flutter valve and incentive spirometry, 5-10 reps every 2 hours while awake, 45 days ordered Consult to respiratory to teach patient appropriate incentive spirometry and flutter valve use ordered on admission Maintain SpO2 greater than 92%, continuous pulse oximetry Admit to telemetry medical, inpatient

## 2023-07-28 NOTE — Assessment & Plan Note (Signed)
-   CPAP nightly ordered 

## 2023-07-28 NOTE — Progress Notes (Signed)
CODE SEPSIS - PHARMACY COMMUNICATION  **Broad Spectrum Antibiotics should be administered within 1 hour of Sepsis diagnosis**  Time Code Sepsis Called/Page Received: 1746  Antibiotics Ordered: Levofloxacin, azithromycin, Unasyn  Time of 1st antibiotic administration: 1717  Additional action taken by pharmacy: None  If necessary, Name of Provider/Nurse Contacted: None    Merryl Hacker ,PharmD Clinical Pharmacist  07/28/2023  5:53 PM

## 2023-07-28 NOTE — Hospital Course (Signed)
Mr. Ryan Pace is a 89 year old with history of paroxysmal atrial fibrillation on Eliquis, depression, anxiety, hypertension, hyperlipidemia, presents emergency department from [facility] assisted nursing facility for chief concerns of tremors and fevers.  Vitals in the ED showed temperature of 98.4, respiration rate of 19, heart rate 77, blood pressure 140/82, SpO2 92% on room air.  Serum sodium is 137, potassium 4.1, chloride 96, bicarb 29, BUN of 41, serum creatinine 1.88, EGFR of 33, nonfasting glucose 130, WBC 13.3, hemoglobin 12.2, platelets of 170.  COVID/influenza A/influenza B/RSV PCR were negative.  ED treatment: Levofloxacin 750 mg p.o. one-time dose ordered  Patient was a possible discharge from the ED however with ambulation, patient desatted to 85% on room air.  Patient had no prior oxygen supplementation requirement.

## 2023-07-28 NOTE — Assessment & Plan Note (Signed)
On CKD 3A LR 1 L bolus ordered on admission followed by LR infusion at 150 mL/h, 20 hours ordered Recheck BMP in the a.m.

## 2023-07-28 NOTE — Assessment & Plan Note (Addendum)
Home lisinopril 20 mg daily not resumed on admission due to acute kidney injury Home metoprolol tartrate 12.5 mg p.o. twice daily, verapamil 360 mg SR nightly or resumed Hydralazine 5 mg IV every 6 hours as needed for SBP greater 175, 5 days ordered

## 2023-07-28 NOTE — ED Notes (Signed)
Pt was cleaned and changed into a gown after having an incontinence episode.

## 2023-07-28 NOTE — Assessment & Plan Note (Addendum)
Home Eliquis 2.5 mg p.o. twice daily, metoprolol 12.5 mg p.o. twice daily were resumed on admission

## 2023-07-28 NOTE — ED Notes (Signed)
Patient transported to CT 

## 2023-07-28 NOTE — Assessment & Plan Note (Addendum)
-   Atorvastatin 40 mg daily resumed 

## 2023-07-28 NOTE — Assessment & Plan Note (Signed)
Home trazodone 150 mg nightly as needed for sleep with additional link ordered to add another 50 mg nightly to help patient's sleep, per dispense history

## 2023-07-28 NOTE — ED Notes (Signed)
Patient ate 50% dinner tray. Patient given ice water. Lights dimmed per patient request

## 2023-07-28 NOTE — ED Provider Notes (Addendum)
[facility] Provider Note    Event Date/Time   First MD Initiated Contact with Patient 07/28/23 1537     (approximate)   History   Fever   HPI  Ryan Pace is a 89 y.o. male   Past medical history of hyperlipidemia, kidney stones, paroxysmal atrial fibrillation on Eliquis who presents to the Emergency Department with 3 weeks of a cough, worsening, and today with fevers and chills.  Pain with a deep breath.  Denies abdominal pain, nausea vomiting or diarrhea or urinary symptoms.  No known sick contacts.   External Medical Documents Reviewed: A home care visit went to her internal medicine on November 13 for back pain      Physical Exam   Triage Vital Signs: ED Triage Vitals  Encounter Vitals Group     BP 07/28/23 1454 (!) 140/82     Systolic BP Percentile --      Diastolic BP Percentile --      Pulse Rate 07/28/23 1454 77     Resp 07/28/23 1454 19     Temp 07/28/23 1454 98.4 F (36.9 C)     Temp src --      SpO2 07/28/23 1454 92 %     Weight --      Height --      Head Circumference --      Peak Flow --      Pain Score 07/28/23 1453 0     Pain Loc --      Pain Education --      Exclude from Growth Chart --     Most recent vital signs: Vitals:   07/28/23 1454  BP: (!) 140/82  Pulse: 77  Resp: 19  Temp: 98.4 F (36.9 C)  SpO2: 92%    General: Awake, no distress.  CV:  Good peripheral perfusion.  Resp:  Normal effort.  Abd:  No distention.  Other:  Occasional productive cough, otherwise no respiratory distress, clear lungs without obvious focality or wheezing.  Soft nontender abdomen.   ED Results / Procedures / Treatments   Labs (all labs ordered are listed, but only abnormal results are displayed) Labs Reviewed  BASIC METABOLIC PANEL - Abnormal; Notable for the following components:      Result Value   Chloride 96 (*)    Glucose, Bld 130 (*)    BUN 41 (*)    Creatinine, Ser 1.88 (*)    Calcium 8.7 (*)    GFR,  Estimated 33 (*)    All other components within normal limits  CBC - Abnormal; Notable for the following components:   WBC 13.3 (*)    RBC 4.03 (*)    Hemoglobin 12.2 (*)    HCT 37.8 (*)    All other components within normal limits  RESP PANEL BY RT-PCR (RSV, FLU A&B, COVID)  [id number]  CULTURE, BLOOD (ROUTINE X 2)  CULTURE, BLOOD (ROUTINE X 2)  LACTIC ACID, PLASMA  LACTIC ACID, PLASMA  PROTIME-INR  APTT     I ordered and reviewed the above labs they are notable for blood cell count mildly elevated 13.3 otherwise cell counts electrolytes unremarkable from a mild elevation in his creatinine from 1.3-1.8  EKG  ED ECG REPORT I, Pilar Jarvis, the attending physician, personally viewed and interpreted this ECG.   Date: 07/28/2023  EKG Time: 1458  Rate: 119  Rhythm: normal EKG, normal sinus rhythm, unchanged from previous tracings  Axis: nl  Intervals:none  ST&T  Change:  no stemi    RADIOLOGY I independently reviewed and interpreted chest x-ray and see a questionable left-sided midlung opacity concerning for pneumonia I also reviewed radiologist's formal read.   PROCEDURES:  Critical Care performed: Yes, see critical care procedure note(s)  .Critical Care  Performed by: Pilar Jarvis, MD Authorized by: Pilar Jarvis, MD   Critical care provider statement:    Critical care time (minutes):  30   Critical care was time spent personally by me on the following activities:  Development of treatment plan with patient or surrogate, discussions with consultants, evaluation of patient's response to treatment, examination of patient, ordering and review of laboratory studies, ordering and review of radiographic studies, ordering and performing treatments and interventions, pulse oximetry, re-evaluation of patient's condition and review of old charts    MEDICATIONS ORDERED IN ED: Medications  levofloxacin (LEVAQUIN) tablet 750 mg (750 mg Oral Given 07/28/23 1717)     IMPRESSION / MDM /  ASSESSMENT AND PLAN / ED COURSE  I reviewed the triage vital signs and the nursing notes.                                Patient's presentation is most consistent with acute presentation with potential threat to life or bodily function.  Differential diagnosis includes, but is not limited to, pneumonia, viral URI, sepsis   The patient is on the cardiac monitor to evaluate for evidence of arrhythmia and/or significant heart rate changes.  MDM:    This is a patient with fevers and chills in the setting of several weeks of cough which have been worsening.  I suspect a respiratory infection.  Fortunately his vital signs are normal, no hypoxemia no fever and normal heart rate and blood pressure does not appear toxic I doubt sepsis.  Will work him up with labs which show mild leukocytosis and his checks x-ray which I thought may have showed some opacity but on radiology read appears normal with a small nodularity in the right lung field, will obtain CT chest for further clarity and also viral swabs.  Given his respiratory infectious symptoms as well as fevers/chills I think that respiratory infection is most likely cause.   This x-ray was unrevealing and so I followed up with a CT chest that does show multifocal pneumonia right sided middle/lower lobes.  I started him on levofloxacin.  Plan was for outpatient treatment however upon ambulating patient desaturated to the mid 80%.  Put on 2 L nasal cannula and will admit for hypoxemia in the setting of community-acquired pneumonia.     Vital signs have been normal up until ambulation trial which then resulted in sustained  tachycardia and tachypnea will add on blood cultures lactic.  Continue to monitor vital signs for improvement.  I think these vital sign abnormalities are driven more by his ambulation trial rather than sepsis as they had been normal up until his ambulation trial.        FINAL CLINICAL IMPRESSION(S) / ED DIAGNOSES   Final  diagnoses:  Bronchitis  Subjective fever  Chills  Community acquired pneumonia of right middle lobe of lung  Hypoxemia     Rx / DC Orders   ED Discharge Orders          Ordered    levofloxacin (LEVAQUIN) 750 MG tablet  Daily        07/28/23 1711  Note:  This document was prepared using Dragon voice recognition software and may include unintentional dictation errors.    Pilar Jarvis, MD 07/28/23 1730    Pilar Jarvis, MD 07/28/23 [phone]

## 2023-07-28 NOTE — Progress Notes (Addendum)
Pharmacy Antibiotic Note  Ryan Pace is a 89 y.o. male admitted on 07/28/2023 with  aspiration pneumonia . On presentation to the ED, patient reported 3 weeks of cough, which has worsened, and fevers/chills today. No abdominal pain, N/V, diarrhea, or urinary symptoms. Pharmacy has been consulted for Unasyn dosing. Patient has a noted penicillin allergy resulting in rash. Patient has tolerated cephalosporins before so decision was made to change to zosyn for the time being.  Afebrile WBC 13.3 CT chest: Patchy airspace consolidation and ground-glass opacity within the dependent portions of the right middle lobe and right lower lobes. Similar, although less prominent findings are present within the left lower lobe. Findings are most compatible with multifocal pneumonia or possibly aspiration  Patient given levofloxaxin 750 mg PO x 1  Plan: Start Zosyn 3.375 g IV q8h Continue to follow renal function and cultures     Temp (24hrs), Avg:98.4 F (36.9 C), Min:98.4 F (36.9 C), Max:98.4 F (36.9 C)  Recent Labs  Lab 07/28/23 1454  WBC 13.3*  CREATININE 1.88*    CrCl cannot be calculated (Unknown ideal weight.).    Allergies  Allergen Reactions   Penicillins Rash         Antimicrobials this admission: 12/1 levofloxacin x 1 12/1 Azithromycin >>  12/2 Zosyn>>  Dose adjustments this admission: None  Microbiology results: None  Thank you for allowing pharmacy to be a part of this patient's care.  Merryl Hacker, PharmD Clinical Pharmacist 07/28/2023 5:54 PM

## 2023-07-28 NOTE — H&P (Addendum)
History and Physical   Ryan Pace:[id number] DOB: 1928/12/29 DOA: 07/28/2023  PCP: Lauro Regulus, MD  Outpatient Specialists: Dr. Olevia Perches clinic cardiology Patient coming from: [facility] assisted nursing facility  I have personally briefly reviewed patient's old medical records in [facility] EMR.  Chief Concern: Tremors and fever  HPI: Mr. Ryan Pace is a 89 year old with history of paroxysmal atrial fibrillation on Eliquis, depression, anxiety, hypertension, hyperlipidemia, presents emergency department from [facility] assisted nursing facility for chief concerns of tremors and fevers.  Vitals in the ED showed temperature of 98.4, respiration rate of 19, heart rate 77, blood pressure 140/82, SpO2 92% on room air.  Serum sodium is 137, potassium 4.1, chloride 96, bicarb 29, BUN of 41, serum creatinine 1.88, EGFR of 33, nonfasting glucose 130, WBC 13.3, hemoglobin 12.2, platelets of 170.  COVID/influenza A/influenza B/RSV PCR were negative.  ED treatment: Levofloxacin 750 mg p.o. one-time dose ordered  Patient was a possible discharge from the ED however with ambulation, patient desatted to 85% on room air.  Patient had no prior oxygen supplementation requirement. ----------------------------- At bedside, patient able to tell me his name, age, location, current calendar year.  He reports he has been having a chronic cough ever since he had a stroke earlier this year.  He also endorses generalized weakness since the stroke.  He endorses tremors and subjective fever that started today.  He did not get his temperature checked.  He endorses poor p.o. intake at baseline, and he does not like to eat vegetables.  He denies trauma to his person.  He reports the cough is not productive.  He reports sometimes vegetables make him nauseous.  He denies chest pain, abdominal pain, dysuria, hematuria, blood in his urine, blood in his stool, syncope, loss of  consciousness, swelling of his lower extremities.  Social history: Patient lives in assisted nursing facility.  He denies tobacco, EtOH, recreational drug use.  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  ROS: Constitutional: no weight change, no fever ENT/Mouth: no sore throat, no rhinorrhea Eyes: no eye pain, no vision changes Cardiovascular: no chest pain, no dyspnea,  no edema, no palpitations Respiratory: + cough, no sputum, no wheezing Gastrointestinal: no nausea, no vomiting, no diarrhea, no constipation Genitourinary: no urinary incontinence, no dysuria, no hematuria Musculoskeletal: no arthralgias, no myalgias Skin: no skin lesions, no pruritus, Neuro: + weakness, no loss of consciousness, no syncope Psych: no anxiety, no depression, + decrease appetite Heme/Lymph: no bruising, no bleeding  ED Course: Discussed with EDP, patient requiring hospitalization for chief concerns of multilobar pneumonia and acute hypoxic respiratory failure.  Assessment/Plan  Principal Problem:   Multilobar lung infiltrate Active Problems:   Acute kidney injury superimposed on CKD (HCC)   Paroxysmal A-fib (HCC)   Essential hypertension   S/P TKR (total knee replacement) using cement, left   Osteoarthritis of left knee   Hyperlipidemia, mixed   Insomnia with sleep apnea   Obstructive sleep apnea   CKD stage 3a, GFR 45-59 ml/min (HCC)   Assessment and Plan:  * Multilobar lung infiltrate With acute hypoxic respiratory failure with exertion/ambulation Unasyn for aspiration pneumonia coverage and azithromycin for atypical pneumonia coverage Flutter valve and incentive spirometry, 5-10 reps every 2 hours while awake, 45 days ordered Consult to respiratory to teach patient appropriate incentive spirometry and flutter valve use ordered on admission Maintain SpO2 greater than 92%, continuous pulse oximetry Admit to telemetry medical, inpatient  Acute kidney  injury superimposed on CKD (HCC) On CKD 3A LR 1 L bolus ordered on admission followed by LR infusion at 150 mL/h, 20 hours ordered Recheck BMP in the a.m.  Paroxysmal A-fib (HCC) Home Eliquis 2.5 mg p.o. twice daily, metoprolol 12.5 mg p.o. twice daily were resumed on admission  Essential hypertension Home lisinopril 20 mg daily not resumed on admission due to acute kidney injury Home metoprolol tartrate 12.5 mg p.o. twice daily, verapamil 360 mg SR nightly or resumed Hydralazine 5 mg IV every 6 hours as needed for SBP greater 175, 5 days ordered  Obstructive sleep apnea CPAP nightly ordered  Insomnia with sleep apnea Home trazodone 150 mg nightly as needed for sleep with additional link ordered to add another 50 mg nightly to help patient's sleep, per dispense history  Hyperlipidemia, mixed Atorvastatin 40 mg daily resumed  Chart reviewed.   DVT prophylaxis: Eliquis 2.5 mg p.o. twice daily Code Status: DNR/DNI Diet: Heart healthy Family Communication: no Disposition Plan: Pending clinical course, guarded prognosis Consults called: Pharmacy Admission status: Telemetry medical, inpatient  Past Medical History:  Diagnosis Date   Anemia    Dysrhythmia    atrial fibrillation   Headache    Hyperlipidemia    Kidney stones    Prostatitis    Past Surgical History:  Procedure Laterality Date   APPENDECTOMY     CARDIAC CATHETERIZATION     CATARACT EXTRACTION, BILATERAL     COLONOSCOPY     1998, 2003, 2011   DUPUYTREN CONTRACTURE RELEASE Bilateral    DUPUYTREN CONTRACTURE RELEASE Right 01/11/2016   Procedure: DUPUYTREN CONTRACTURE RELEASE;  Surgeon: Deeann Saint, MD;  Location: [facility] ORS;  Service: Orthopedics;  Laterality: Right;   FLEXIBLE SIGMOIDOSCOPY     1993   GREEN LIGHT LASER TURP (TRANSURETHRAL RESECTION OF PROSTATE N/A 02/15/2015   Procedure: GREEN LIGHT LASER TURP (TRANSURETHRAL RESECTION OF PROSTATE;  Surgeon: Orson Ape, MD;  Location: [facility] ORS;  Service:  Urology;  Laterality: N/A;   TONSILLECTOMY     TOTAL KNEE ARTHROPLASTY Left 10/29/2018   Procedure: TOTAL KNEE ARTHROPLASTY-LEFT;  Surgeon: Lyndle Herrlich, MD;  Location: [facility] ORS;  Service: Orthopedics;  Laterality: Left;   Social History:  reports that he has never smoked. He has quit using smokeless tobacco. He reports that he does not drink alcohol and does not use drugs.  Allergies  Allergen Reactions   Penicillins Rash        Family History  Problem Relation Age of Onset   Stroke Mother    Colon cancer Mother    Anuerysm Father    Family history: Family history reviewed and not pertinent  Prior to Admission medications   Medication Sig Start Date End Date Taking? Authorizing Provider  levofloxacin (LEVAQUIN) 750 MG tablet Take 1 tablet (750 mg total) by mouth daily for 7 days. 07/28/23 08/04/23 Yes Pilar Jarvis, MD  acetaminophen (TYLENOL) 500 MG tablet Take 2 tablets (1,000 mg total) by mouth every 6 (six) hours as needed for mild pain (or Fever >/= 101). 04/15/23   Alford Highland, MD  apixaban (ELIQUIS) 5 MG TABS tablet Take 1 tablet (5 mg total) by mouth 2 (two) times daily. 04/15/23   Alford Highland, MD  atorvastatin (LIPITOR) 40 MG tablet Take 1 tablet (40 mg total) by mouth daily. 04/15/23   Alford Highland, MD  cetirizine (ZYRTEC) 5 MG tablet Take 10 mg by mouth daily.    [provider]  cholecalciferol (VITAMIN D) 1000 units tablet Take 1,000 Units  by mouth daily.     [provider]  ferrous sulfate 325 (65 FE) MG EC tablet Take 325 mg by mouth daily.    [provider]  polyethylene glycol (MIRALAX / GLYCOLAX) 17 g packet Take 17 g by mouth daily as needed for mild constipation. 04/15/23   Alford Highland, MD  sertraline (ZOLOFT) 50 MG tablet Take 50 mg by mouth daily.    [provider]  traZODone (DESYREL) 50 MG tablet Take 100 mg by mouth at bedtime.    [provider]  verapamil (CALAN-SR) 180 MG CR tablet Take 2 tablets  (360 mg total) by mouth at bedtime. 04/15/23   Alford Highland, MD   Physical Exam: Vitals:   07/28/23 1454 07/28/23 1700 07/28/23 1828  BP: (!) 140/82 130/73   Pulse: 77 (!) 117   Resp: 19 20   Temp: 98.4 F (36.9 C)    SpO2: 92% 96%   Weight:   88.5 kg   Constitutional: appears age-appropriate, frail, calm Eyes: PERRL, lids and conjunctivae normal ENMT: Mucous membranes are moist. Posterior pharynx clear of any exudate or lesions. Age-appropriate dentition. Hearing appropriate. Neck: normal, supple, no masses, no thyromegaly Respiratory: Decreased right-sided lung sounds on auscultation.  Clear on the left upper and middle lobes. Mild decrease in lung sounds in the left lower lobe., no wheezing, no crackles.  Increased respiratory effort.  Mild increased accessory muscle use.  Nasal cannula in place Cardiovascular: Regular rate and rhythm, no murmurs / rubs / gallops. No extremity edema. 2+ pedal pulses. No carotid bruits.  Abdomen: no tenderness, no masses palpated, no hepatosplenomegaly. Bowel sounds positive.  Musculoskeletal: no clubbing / cyanosis. No joint deformity upper and lower extremities. Good ROM, no contractures, no atrophy. Normal muscle tone.  Skin: no rashes, lesions, ulcers. No induration Neurologic: Sensation intact. Strength 5/5 in all 4.  Psychiatric: Normal judgment and insight. Alert and oriented x 3. Normal mood.   EKG: independently reviewed, showing sinus tachycardia with rate of 119, QTc 436  Chest x-ray on Admission: I personally reviewed and I agree with radiologist reading as below.  CT Chest Wo Contrast  Result Date: 07/28/2023 CLINICAL DATA:  Respiratory illness, nondiagnostic xray EXAM: CT CHEST WITHOUT CONTRAST TECHNIQUE: Multidetector CT imaging of the chest was performed following the standard protocol without IV contrast. RADIATION DOSE REDUCTION: This exam was performed according to the departmental dose-optimization program which includes  automated exposure control, adjustment of the mA and/or kV according to patient size and/or use of iterative reconstruction technique. COMPARISON:  Same-day x-ray FINDINGS: Cardiovascular: Normal heart size. No pericardial effusion. Thoracic aorta is nonaneurysmal. Scattered atherosclerotic vascular calcifications of the aorta and coronary arteries. Central pulmonary vasculature is within normal limits. Mediastinum/Nodes: No enlarged mediastinal or axillary lymph nodes. Thyroid gland, trachea, and esophagus demonstrate no significant findings. Lungs/Pleura: Patchy airspace consolidation and ground-glass opacity within the dependent portions of the right middle lobe and right lower lobes. Similar, although less prominent findings are present within the left lower lobe. Biapical pleuroparenchymal scarring. The upper lung fields are otherwise clear. Specifically, no right upper lobe pulmonary nodule. No pleural effusion or pneumothorax. Upper Abdomen: Nonobstructing bilateral renal calculi. Musculoskeletal: Subacute appearing minimally displaced fracture of the posterior right eleventh rib. Healing nondisplaced fracture of the posterior right ninth rib. No chest wall hematoma or other abnormality. No subcutaneous emphysema. IMPRESSION: 1. Patchy airspace consolidation and ground-glass opacity within the dependent portions of the right middle lobe and right lower lobes. Similar, although less prominent  findings are present within the left lower lobe. Findings are most compatible with multifocal pneumonia or possibly aspiration. 2. Subacute appearing minimally displaced fracture of the posterior right eleventh rib. Healing nondisplaced fracture of the posterior right ninth rib. 3. Nonobstructing bilateral renal calculi. 4. Aortic and coronary artery atherosclerosis (ICD10-I70.0). Electronically Signed   By: Duanne Guess D.O.   On: 07/28/2023 16:58   DG Chest 2 View  Result Date: 07/28/2023 CLINICAL DATA:   Shortness of breath EXAM: CHEST - 2 VIEW COMPARISON:  09/02/2022 FINDINGS: The heart size and mediastinal contours are within normal limits. Slightly low lung volumes. Subtle nodular density projecting within the right upper lobe. Lungs are otherwise clear. No pleural effusion or pneumothorax. The visualized skeletal structures are unremarkable. IMPRESSION: Subtle nodular density projecting within the right upper lobe. Lungs appear otherwise clear. A follow-up CT could be considered, if deemed clinically warranted. Electronically Signed   By: Duanne Guess D.O.   On: 07/28/2023 15:40    Labs on Admission: I have personally reviewed following labs CBC: Recent Labs  Lab 07/28/23 1454  WBC 13.3*  HGB 12.2*  HCT 37.8*  MCV 93.8  PLT 178   Basic Metabolic Panel: Recent Labs  Lab 07/28/23 1454  NA 137  K 4.1  CL 96*  CO2 29  GLUCOSE 130*  BUN 41*  CREATININE 1.88*  CALCIUM 8.7*   GFR: Estimated Creatinine Clearance: 27.6 mL/min (A) (by C-G formula based on SCr of 1.88 mg/dL (H)).  Urine analysis:    Component Value Date/Time   COLORURINE YELLOW (A) 04/09/2023 1346   APPEARANCEUR CLEAR (A) 04/09/2023 1346   APPEARANCEUR Clear 12/01/2019 1349   LABSPEC 1.009 04/09/2023 1346   PHURINE 6.0 04/09/2023 1346   GLUCOSEU NEGATIVE 04/09/2023 1346   HGBUR NEGATIVE 04/09/2023 1346   BILIRUBINUR NEGATIVE 04/09/2023 1346   BILIRUBINUR Negative 12/01/2019 1349   KETONESUR NEGATIVE 04/09/2023 1346   PROTEINUR NEGATIVE 04/09/2023 1346   NITRITE NEGATIVE 04/09/2023 1346   LEUKOCYTESUR NEGATIVE 04/09/2023 1346   This document was prepared using Dragon Voice Recognition software and may include unintentional dictation errors.  Dr. Sedalia Muta [facility]  If 7PM-7AM, please contact overnight-coverage provider If 7AM-7PM, please contact day attending provider [email or web address]  07/28/2023, 7:33 PM

## 2023-07-29 ENCOUNTER — Encounter: Payer: Self-pay | Admitting: Internal Medicine

## 2023-07-29 DIAGNOSIS — R918 Other nonspecific abnormal finding of lung field: Secondary | ICD-10-CM | POA: Diagnosis not present

## 2023-07-29 DIAGNOSIS — R6883 Chills (without fever): Secondary | ICD-10-CM | POA: Diagnosis not present

## 2023-07-29 DIAGNOSIS — J189 Pneumonia, unspecified organism: Secondary | ICD-10-CM | POA: Diagnosis not present

## 2023-07-29 DIAGNOSIS — J40 Bronchitis, not specified as acute or chronic: Secondary | ICD-10-CM | POA: Diagnosis not present

## 2023-07-29 DIAGNOSIS — R509 Fever, unspecified: Secondary | ICD-10-CM

## 2023-07-29 DIAGNOSIS — R0902 Hypoxemia: Secondary | ICD-10-CM

## 2023-07-29 LAB — CBC
HCT: 30.1 % — ABNORMAL LOW (ref 39.0–52.0)
Hemoglobin: 9.8 g/dL — ABNORMAL LOW (ref 13.0–17.0)
MCH: 30.4 pg (ref 26.0–34.0)
MCHC: 32.6 g/dL (ref 30.0–36.0)
MCV: 93.5 fL (ref 80.0–100.0)
Platelets: 143 10*3/uL — ABNORMAL LOW (ref 150–400)
RBC: 3.22 MIL/uL — ABNORMAL LOW (ref 4.22–5.81)
RDW: 13.5 % (ref 11.5–15.5)
WBC: 14.5 10*3/uL — ABNORMAL HIGH (ref 4.0–10.5)
nRBC: 0 % (ref 0.0–0.2)

## 2023-07-29 LAB — BASIC METABOLIC PANEL
Anion gap: 8 (ref 5–15)
BUN: 39 mg/dL — ABNORMAL HIGH (ref 8–23)
CO2: 32 mmol/L (ref 22–32)
Calcium: 8.7 mg/dL — ABNORMAL LOW (ref 8.9–10.3)
Chloride: 98 mmol/L (ref 98–111)
Creatinine, Ser: 1.91 mg/dL — ABNORMAL HIGH (ref 0.61–1.24)
GFR, Estimated: 32 mL/min — ABNORMAL LOW (ref >60)
Glucose, Bld: 104 mg/dL — ABNORMAL HIGH (ref 70–99)
Potassium: 4 mmol/L (ref 3.5–5.1)
Sodium: 138 mmol/L (ref 135–145)

## 2023-07-29 MED ORDER — SODIUM CHLORIDE 0.9 % IV SOLN
1.0000 g | Freq: Two times a day (BID) | INTRAVENOUS | Status: DC
  Administered 2023-07-29: 1 g via INTRAVENOUS
  Filled 2023-07-29: qty 20

## 2023-07-29 MED ORDER — SODIUM CHLORIDE 0.9 % IV SOLN
2.0000 g | INTRAVENOUS | Status: DC
  Administered 2023-07-29: 2 g via INTRAVENOUS
  Filled 2023-07-29 (×2): qty 20

## 2023-07-29 MED ORDER — DEXTROSE 5 % IV SOLN
500.0000 mg | INTRAVENOUS | Status: DC
  Administered 2023-07-29: 500 mg via INTRAVENOUS
  Filled 2023-07-29 (×2): qty 5

## 2023-07-29 NOTE — Progress Notes (Signed)
Pharmacy Antibiotic Note  Ryan Pace is a 89 y.o. male admitted on 07/28/2023 with  aspiration pneumonia . On presentation to the ED, patient reported 3 weeks of cough, which has worsened, and fevers/chills today. No abdominal pain, N/V, diarrhea, or urinary symptoms. Pharmacy has been consulted for Unasyn dosing. Patient has a noted penicillin allergy resulting in rash. Patient being transitioned to meropenem.  Afebrile WBC 13.3 CT chest: Patchy airspace consolidation and ground-glass opacity within the dependent portions of the right middle lobe and right lower lobes. Similar, although less prominent findings are present within the left lower lobe. Findings are most compatible with multifocal pneumonia or possibly aspiration  Patient given levofloxaxin 750 mg PO x 1  Plan: Meropenem 1 gm q12h per indication and renal fxn. Continue to follow renal function and cultures  Height: 6' (182.9 cm) Weight: 92.3 kg (203 lb 7.8 oz) IBW/kg (Calculated) : 77.6  Temp (24hrs), Avg:98.3 F (36.8 C), Min:98.1 F (36.7 C), Max:98.4 F (36.9 C)  Recent Labs  Lab 07/28/23 1454 07/28/23 1805 07/28/23 2012  WBC 13.3*  --   --   CREATININE 1.88*  --   --   LATICACIDVEN  --  1.4 1.7    Estimated Creatinine Clearance: 27.5 mL/min (A) (by C-G formula based on SCr of 1.88 mg/dL (H)).    Allergies  Allergen Reactions   Penicillins Rash         Antimicrobials this admission: 12/1 levofloxacin x 1 12/1 Azithromycin >>  12/2 Meropenem>>  Dose adjustments this admission: None  Microbiology results: None  Thank you for allowing pharmacy to be a part of this patient's care.  Otelia Sergeant, PharmD, [facility] 07/29/2023 2:56 AM

## 2023-07-29 NOTE — Progress Notes (Signed)
Tele notified RN that the patient's HR has dropped into the low 40s unsustained, but is sustaining in mid 50s Patient asleep. RN woke patient up, patient denies symptoms of bradycardia.   RN notified Larkin Ina, NP of the above information per the order to notify provider is HR<55

## 2023-07-29 NOTE — Progress Notes (Signed)
patient has R side upper lip droop. RN asked the patient and his son about the R upper lip droop,  per the patient report and the patient's son's report from prior stroke.  RN did not see this noted in the physician's note.  Rn notified Larkin Ina, NP of the above information.

## 2023-07-29 NOTE — TOC Initial Note (Signed)
Transition of Care [facility]) - Initial/Assessment Note    Patient Details  Name: Ryan Pace MRN: [id number] Date of Birth: 1929-08-05  [facility]) CM/SW Contact:    Allena Katz, LCSW Phone Number: 07/29/2023, 2:41 PM  Clinical Narrative:  Pt admitted from [facility] for LTC. [facility] has requested a bedside swallow evaluation. They have stated when the patient is ready he is able to return at discharge.                 Expected Discharge Plan: Long Term Nursing Home Barriers to Discharge: Continued Medical Work up   Patient Goals and CMS Choice            Expected Discharge Plan and Services                                              Prior Living Arrangements/Services   Lives with:: Facility Resident   Do you feel safe going back to the place where you live?: Yes      Need for Family Participation in Patient Care: Yes (Comment)        Activities of Daily Living   ADL Screening (condition at time of admission) Independently performs ADLs?: No Does the patient have a NEW difficulty with bathing/dressing/toileting/self-feeding that is expected to last >3 days?: No Does the patient have a NEW difficulty with getting in/out of bed, walking, or climbing stairs that is expected to last >3 days?: No Does the patient have a NEW difficulty with communication that is expected to last >3 days?: No Is the patient deaf or have difficulty hearing?: No Does the patient have difficulty seeing, even when wearing glasses/contacts?: No Does the patient have difficulty concentrating, remembering, or making decisions?: Yes  Permission Sought/Granted                  Emotional Assessment       Orientation: : Oriented to Self, Oriented to Place, Oriented to  Time, Oriented to Situation      Admission diagnosis:  Hypoxemia [R09.02] Chills [R68.83] Bronchitis [J40] Community acquired pneumonia of right middle lobe of lung [J18.9] Multilobar  lung infiltrate [R91.8] Subjective fever [R50.9] Patient Active Problem List   Diagnosis Date Noted   Bronchitis 07/29/2023   Chills 07/29/2023   Community acquired pneumonia of right middle lobe of lung 07/29/2023   Hypoxemia 07/29/2023   Subjective fever 07/29/2023   Multilobar lung infiltrate 07/28/2023   CKD stage 3a, GFR 45-59 ml/min (HCC) 07/28/2023   Embolic stroke involving left anterior cerebral artery (HCC) 04/11/2023   Acute kidney injury superimposed on CKD (HCC) 04/11/2023   Overweight (BMI 25.0-29.9) 04/11/2023   Skin lesion 04/11/2023   Generalized weakness 04/09/2023   Orthostatic hypotension 04/09/2023   Cellulitis 04/09/2023   Bilateral lower extremity edema 04/09/2023   Obstructive sleep apnea 04/18/2020   S/P TKR (total knee replacement) using cement, left 10/29/2018   Essential hypertension 10/08/2018   Osteoarthritis of left knee 09/19/2018   Hematoma 06/08/2018   Seborrheic dermatitis 05/28/2018   Dupuytren's contracture 12/12/2015   Insomnia with sleep apnea 10/12/2015   Hyperlipidemia, mixed 03/09/2015   Moderate mitral insufficiency 09/06/2014   Paroxysmal A-fib (HCC) 09/06/2014   CAD (coronary artery disease), native coronary artery 02/05/2014   PCP:  Lauro Regulus, MD Pharmacy:   [facility] - [place]  [place], [place] - [address] [place] [address] Phone: [phone] Fax: [phone]     Social Determinants of Health (SDOH) Social History: SDOH Screenings   Food Insecurity: No Food Insecurity (07/28/2023)  Housing: Patient Declined (07/28/2023)  Transportation Needs: No Transportation Needs (07/28/2023)  Utilities: Not At Risk (07/28/2023)  Tobacco Use: Medium Risk (07/29/2023)   SDOH Interventions:     Readmission Risk Interventions    07/29/2023    2:40 PM  Readmission Risk Prevention Plan  Transportation Screening Complete  Palliative Care Screening Complete  Medication Review  (RN Care Manager) Complete

## 2023-07-29 NOTE — Progress Notes (Signed)
   07/29/23 0328  Assess: MEWS Score  BP (!) 101/56    Patient asymptomatic.   RN notified Larkin Ina, NP of the above information per the order to notify if DBP <60

## 2023-07-29 NOTE — Progress Notes (Addendum)
PROGRESS NOTE  Ryan Pace    DOB: 08/02/1929, 89 y.o.  Ryan Pace:[id number]    Code Status: Limited: Do not attempt resuscitation (DNR) -DNR-LIMITED -Do Not Intubate/DNI    DOA: 07/28/2023   LOS: 1   Brief hospital course  Ryan Pace is a 89 y.o. male with a PMH significant for paroxysmal atrial fibrillation on Eliquis, depression, anxiety, hypertension, hyperlipidemia.  They presented from [facility] to the ED on 07/28/2023 with tremors and fever.  In the ED, it was found that they had temperature of 98.4, respiration rate of 19, heart rate 77, blood pressure 140/82, SpO2 92% on room air.  Significant findings included sodium is 137, potassium 4.1, chloride 96, bicarb 29, BUN of 41, serum creatinine 1.88, EGFR of 33, nonfasting glucose 130, WBC 13.3, hemoglobin 12.2, platelets of 170.   COVID/influenza A/influenza B/RSV PCR were negative.   ED treatment: Levofloxacin 750 mg p.o. one-time dose ordered   Patient was a possible discharge from the ED however with ambulation, patient desatted to 85% on room air.  Patient had no prior oxygen supplementation requirement..  Patient was admitted to medicine service for further workup and management of multifocal pneumonia as outlined in detail below.  07/29/23 -improvement. Weaned from oxygen. Wants to get home before Friday to celebrate his wife's 90th birthday.   Assessment & Plan  Principal Problem:   Multilobar lung infiltrate Active Problems:   Acute kidney injury superimposed on CKD (HCC)   Paroxysmal A-fib (HCC)   Essential hypertension   S/P TKR (total knee replacement) using cement, left   Osteoarthritis of left knee   Hyperlipidemia, mixed   Insomnia with sleep apnea   Obstructive sleep apnea   CKD stage 3a, GFR 45-59 ml/min (HCC)  Multilobar lung infiltrate- stable ORA while resting. Will ambulate in the am to see if desats.  - continue IV azithromycin and CTX Flutter valve and incentive spirometry - SLP  consult ordered per request of his facility, presumably concerned for aspiration   Acute kidney injury superimposed on CKD IIIa (HCC) Cr 1.91 today is worsened from baseline around 1.3 S/p IV fluids - recheck am and encourage PO fluid intake.   Normocytic anemia- suspect dilutional. No signs of acute bleed. If he does have an occult GI bleed, could lead to SOB so will monitor closely - CBC am   Paroxysmal A-fib (HCC) - continue Home Eliquis 2.5 mg p.o. twice daily, metoprolol 12.5 mg   Essential hypertension Home lisinopril 20 mg daily not resumed on admission due to acute kidney injury Home metoprolol tartrate 12.5 mg p.o. twice daily, verapamil 360 mg SR nightly or resumed   Obstructive sleep apnea CPAP nightly ordered   Insomnia with sleep apnea Home trazodone 150 mg nightly as needed for sleep with additional link ordered to add another 50 mg nightly to help patient's sleep, per dispense history   Hyperlipidemia, mixed Atorvastatin 40 mg daily resumed  Body mass index is 27.6 kg/m.  VTE ppx: apixaban (ELIQUIS) tablet 2.5 mg Start: 07/28/23 2200 Place TED hose Start: 07/28/23 1744 apixaban (ELIQUIS) tablet 2.5 mg   Diet:     Diet   Diet Heart Room service appropriate? Yes; Fluid consistency: Thin   Consultants: None   Subjective 07/29/23    Ryan Pace reports feeling mentally at his baseline but his body is tired. Has no dyspnea. Persistent dry cough.    Objective   Vitals:   07/28/23 2122 07/28/23 2159 07/28/23 2349 07/29/23 0328  BP:  94/60 (!) 101/56  Pulse:   72 (!) 57  Resp:   20 20  Temp:   98.1 F (36.7 C) 98.3 F (36.8 C)  SpO2:   96% 97%  Weight: 92.3 kg     Height: 6' (1.829 m) 6' (1.829 m)      Intake/Output Summary (Last 24 hours) at 07/29/2023 0743 Last data filed at 07/29/2023 0444 Gross per 24 hour  Intake 2409.51 ml  Output 325 ml  Net 2084.51 ml   Filed Weights   07/28/23 1828 07/28/23 2122  Weight: 88.5 kg 92.3 kg    Physical Exam:   General: awake, alert, NAD HEENT: atraumatic, clear conjunctiva, anicteric sclera, MMM, hearing grossly normal Respiratory: normal respiratory effort. No wheezing, CTAB Cardiovascular: quick capillary refill, systolic murmur Nervous: A&O x3. no gross focal neurologic deficits, speech audible with some tremorous voice. He was able to stand and pivot independently with walker.  Extremities: moves all equally, no edema, normal tone Skin: dry, intact, normal temperature, normal color. No rashes, lesions or ulcers on exposed skin Psychiatry: normal mood, congruent affect  Labs   I have personally reviewed the following labs and imaging studies CBC    Component Value Date/Time   WBC 14.5 (H) 07/29/2023 0600   RBC 3.22 (L) 07/29/2023 0600   HGB 9.8 (L) 07/29/2023 0600   HCT 30.1 (L) 07/29/2023 0600   PLT 143 (L) 07/29/2023 0600   MCV 93.5 07/29/2023 0600   MCH 30.4 07/29/2023 0600   MCHC 32.6 07/29/2023 0600   RDW 13.5 07/29/2023 0600   LYMPHSABS 1.4 04/11/2023 0501   MONOABS 0.6 04/11/2023 0501   EOSABS 0.1 04/11/2023 0501   BASOSABS 0.0 04/11/2023 0501      Latest Ref Rng & Units 07/29/2023    6:00 AM 07/28/2023    2:54 PM 04/15/2023    3:40 AM  BMP  Glucose 70 - 99 mg/dL 161  096  045   BUN 8 - 23 mg/dL 39  41  40   Creatinine 0.61 - 1.24 mg/dL 4.09  8.11  9.14   Sodium 135 - 145 mmol/L 138  137  137   Potassium 3.5 - 5.1 mmol/L 4.0  4.1  4.2   Chloride 98 - 111 mmol/L 98  96  101   CO2 22 - 32 mmol/L 32  29  29   Calcium 8.9 - 10.3 mg/dL 8.7  8.7  8.9     CT Chest Wo Contrast  Result Date: 07/28/2023 CLINICAL DATA:  Respiratory illness, nondiagnostic xray EXAM: CT CHEST WITHOUT CONTRAST TECHNIQUE: Multidetector CT imaging of the chest was performed following the standard protocol without IV contrast. RADIATION DOSE REDUCTION: This exam was performed according to the departmental dose-optimization program which includes automated exposure control, adjustment of the mA and/or  kV according to patient size and/or use of iterative reconstruction technique. COMPARISON:  Same-day x-ray FINDINGS: Cardiovascular: Normal heart size. No pericardial effusion. Thoracic aorta is nonaneurysmal. Scattered atherosclerotic vascular calcifications of the aorta and coronary arteries. Central pulmonary vasculature is within normal limits. Mediastinum/Nodes: No enlarged mediastinal or axillary lymph nodes. Thyroid gland, trachea, and esophagus demonstrate no significant findings. Lungs/Pleura: Patchy airspace consolidation and ground-glass opacity within the dependent portions of the right middle lobe and right lower lobes. Similar, although less prominent findings are present within the left lower lobe. Biapical pleuroparenchymal scarring. The upper lung fields are otherwise clear. Specifically, no right upper lobe pulmonary nodule. No pleural effusion or pneumothorax. Upper Abdomen: Nonobstructing bilateral renal  calculi. Musculoskeletal: Subacute appearing minimally displaced fracture of the posterior right eleventh rib. Healing nondisplaced fracture of the posterior right ninth rib. No chest wall hematoma or other abnormality. No subcutaneous emphysema. IMPRESSION: 1. Patchy airspace consolidation and ground-glass opacity within the dependent portions of the right middle lobe and right lower lobes. Similar, although less prominent findings are present within the left lower lobe. Findings are most compatible with multifocal pneumonia or possibly aspiration. 2. Subacute appearing minimally displaced fracture of the posterior right eleventh rib. Healing nondisplaced fracture of the posterior right ninth rib. 3. Nonobstructing bilateral renal calculi. 4. Aortic and coronary artery atherosclerosis (ICD10-I70.0). Electronically Signed   By: Duanne Guess D.O.   On: 07/28/2023 16:58   DG Chest 2 View  Result Date: 07/28/2023 CLINICAL DATA:  Shortness of breath EXAM: CHEST - 2 VIEW COMPARISON:   09/02/2022 FINDINGS: The heart size and mediastinal contours are within normal limits. Slightly low lung volumes. Subtle nodular density projecting within the right upper lobe. Lungs are otherwise clear. No pleural effusion or pneumothorax. The visualized skeletal structures are unremarkable. IMPRESSION: Subtle nodular density projecting within the right upper lobe. Lungs appear otherwise clear. A follow-up CT could be considered, if deemed clinically warranted. Electronically Signed   By: Duanne Guess D.O.   On: 07/28/2023 15:40    Disposition Plan & Communication  Patient status: Inpatient  Admitted From: ALF Planned disposition location: Assisted living Anticipated discharge date: 12/3 pending ambulate without desat  Family Communication: son at bedside    Author: Leeroy Bock, DO [facility] 07/29/2023, 7:43 AM   Available by Epic secure chat 7AM-7PM. If 7PM-7AM, please contact night-coverage.  [facility] contact information found on [email or web address].

## 2023-07-29 NOTE — Evaluation (Signed)
Clinical/Bedside Swallow Evaluation Patient Details  Name: Ryan Pace MRN: [id number] Date of Birth: Jul 27, 1929  Today's Date: 07/29/2023 Time: SLP Start Time (ACUTE ONLY): 1347 SLP Stop Time (ACUTE ONLY): 1410 SLP Time Calculation (min) (ACUTE ONLY): 23 min  Past Medical History:  Past Medical History:  Diagnosis Date   Anemia    Dysrhythmia    atrial fibrillation   Headache    Hyperlipidemia    Kidney stones    Prostatitis    Stroke Ryan Pace)    Past Surgical History:  Past Surgical History:  Procedure Laterality Date   APPENDECTOMY     CARDIAC CATHETERIZATION     CATARACT EXTRACTION, BILATERAL     COLONOSCOPY     1998, 2003, 2011   DUPUYTREN CONTRACTURE RELEASE Bilateral    DUPUYTREN CONTRACTURE RELEASE Right 01/11/2016   Procedure: DUPUYTREN CONTRACTURE RELEASE;  Surgeon: Ryan Saint, MD;  Location: [facility] OR[facility];  Service: Orthopedics;  Laterality: Right;   FLEXIBLE SIGMOIDOSCOPY     1993   GREEN LIGHT LASER TURP (TRANSURETHRAL RESECTION OF PROSTATE N/A 02/15/2015   Procedure: GREEN LIGHT LASER TURP (TRANSURETHRAL RESECTION OF PROSTATE;  Surgeon: Ryan Ape, MD;  Location: [facility] OR[facility];  Service: Urology;  Laterality: N/A;   TONSILLECTOMY     TOTAL KNEE ARTHROPLASTY Left 10/29/2018   Procedure: TOTAL KNEE ARTHROPLASTY-LEFT;  Surgeon: Lyndle Herrlich, MD;  Location: [facility] ORS;  Service: Orthopedics;  Laterality: Left;   HPI:  89yo male admitted from ALF 07/28/23 with tremors and fever. PMH: PAFib, anxiety/depression, HTN, HLD, CVA earlier this year, chronic cough since stroke, OSA, insomnia. CXR = Subtle nodular density projecting within the right upper lobe. CTChest = Patchy airspace consolidation and ground-glass opacity within the  dependent portions of the right middle lobe and right lower lobes. Similar, although less prominent findings are present within the  left lower lobe. Findings are most compatible with multifocal pneumonia or possibly aspiration.    Assessment /  Plan / Recommendation  Clinical Impression  Pt seen at bedside for assessment of swallow function and safety. Pt presents with adequate natural dentition. He is missing a few teeth, however, reports tolerance of regular solids prior to admit. Functional orofacial strength and ROM reported and observed. Pt accepted trials of thin liquid, puree, and solid texture.   Pt was unable to complete 3oz water challenge due to coughing mid-way through, indicating increased risk of aspiration. Small individual sips of thin liquid via cup and straw were tolerated without overt s/s aspiration. Puree and solid textures also tolerated well. Pt reported he had undergone a FEES at his facility within the last month. He indicated no penetration or aspiration was seen, and that the SLP recommended small bites and sips with regular texture solids and thin liquids.   At this time, recommend continued current diet with adherence to safe swallow precautions reviewed today (small individual bites/sips, slow rate). Recommend follow up with SLP at facility for continuity of care to continue assessment of diet tolerance and education, and repeat FEES if/when needed.  SLP Visit Diagnosis: Dysphagia, unspecified (R13.10)    Aspiration Risk  Mild aspiration risk    Diet Recommendation Regular;Thin liquid    Liquid Administration via: Straw;Cup Medication Administration: Other (Comment) (as tolerated) Supervision: Patient able to self feed Compensations: Minimize environmental distractions;Slow rate;Small sips/bites Postural Changes: Seated upright at 90 degrees;Remain upright for at least 30 minutes after po intake    Other  Recommendations Oral Care Recommendations: Oral care BID    Recommendations for  follow up therapy are one component of a multi-disciplinary discharge planning process, led by the attending physician.  Recommendations may be updated based on patient status, additional functional criteria and insurance  authorization.  Follow up Recommendations Skilled nursing-short term rehab (<3 hours/day)      Functional Status Assessment Patient has not had a recent decline in their functional status      Prognosis Prognosis for improved oropharyngeal function: Fair      Swallow Study   General Date of Onset: 07/28/23 HPI: 89yo male admitted from ALF 07/28/23 with tremors and fever. PMH: PAFib, anxiety/depression, HTN, HLD, CVA earlier this year, chronic cough since stroke, OSA, insomnia. CXR = Subtle nodular density projecting within the right upper lobe. CTChest = Patchy airspace consolidation and ground-glass opacity within the  dependent portions of the right middle lobe and right lower lobes. Similar, although less prominent findings are present within the  left lower lobe. Findings are most compatible with multifocal pneumonia or possibly aspiration. Type of Study: Bedside Swallow Evaluation Previous Swallow Assessment: Pt reports FEES completed within the past month at facility - pt reported no penetration/aspiration seen. SLP recommended small bites/sips, reg/thin. SLE completed 03/2023 Diet Prior to this Study: Regular;Thin liquids (Level 0) Temperature Spikes Noted: No Respiratory Status: Room air History of Recent Intubation: No Behavior/Cognition: Alert;Cooperative;Pleasant mood Oral Cavity Assessment: Within Functional Limits Oral Care Completed by SLP: No Oral Cavity - Dentition: Missing dentition;Adequate natural dentition Vision: Functional for self-feeding Self-Feeding Abilities: Able to feed self Patient Positioning: Upright in bed Baseline Vocal Quality: Normal Volitional Cough: Strong Volitional Swallow: Able to elicit    Oral/Motor/Sensory Function Overall Oral Motor/Sensory Function: Within functional limits   Thin Liquid Presentation: Cup;Straw Other Comments: Pt tolerated small bites/sips of water via cup and straw    Puree Puree: Within functional limits Presentation:  Spoon   Solid     Solid: Within functional limits Presentation: Self Fed     Ryan Pace Ryan Pace, Ryan Pace, CCC-SLP Speech Language Pathologist  Ryan Pace 07/29/2023,2:24 PM

## 2023-07-30 DIAGNOSIS — R0902 Hypoxemia: Secondary | ICD-10-CM | POA: Diagnosis not present

## 2023-07-30 DIAGNOSIS — R918 Other nonspecific abnormal finding of lung field: Secondary | ICD-10-CM | POA: Diagnosis not present

## 2023-07-30 DIAGNOSIS — J189 Pneumonia, unspecified organism: Secondary | ICD-10-CM | POA: Diagnosis not present

## 2023-07-30 LAB — CBC
HCT: 31.3 % — ABNORMAL LOW (ref 39.0–52.0)
Hemoglobin: 10.5 g/dL — ABNORMAL LOW (ref 13.0–17.0)
MCH: 30.5 pg (ref 26.0–34.0)
MCHC: 33.5 g/dL (ref 30.0–36.0)
MCV: 91 fL (ref 80.0–100.0)
Platelets: 179 10*3/uL (ref 150–400)
RBC: 3.44 MIL/uL — ABNORMAL LOW (ref 4.22–5.81)
RDW: 13.3 % (ref 11.5–15.5)
WBC: 10.9 10*3/uL — ABNORMAL HIGH (ref 4.0–10.5)
nRBC: 0 % (ref 0.0–0.2)

## 2023-07-30 LAB — BASIC METABOLIC PANEL
Anion gap: 8 (ref 5–15)
BUN: 40 mg/dL — ABNORMAL HIGH (ref 8–23)
CO2: 28 mmol/L (ref 22–32)
Calcium: 9 mg/dL (ref 8.9–10.3)
Chloride: 102 mmol/L (ref 98–111)
Creatinine, Ser: 1.69 mg/dL — ABNORMAL HIGH (ref 0.61–1.24)
GFR, Estimated: 38 mL/min — ABNORMAL LOW (ref >60)
Glucose, Bld: 113 mg/dL — ABNORMAL HIGH (ref 70–99)
Potassium: 3.7 mmol/L (ref 3.5–5.1)
Sodium: 138 mmol/L (ref 135–145)

## 2023-07-30 MED ORDER — AZITHROMYCIN 500 MG PO TABS: 500.0000 mg | ORAL_TABLET | Freq: Every day | ORAL | Status: AC

## 2023-07-30 MED ORDER — CEPHALEXIN 500 MG PO CAPS: 500.0000 mg | ORAL_CAPSULE | Freq: Two times a day (BID) | ORAL | Status: AC

## 2023-07-30 MED ORDER — SERTRALINE HCL 25 MG PO TABS: 12.5000 mg | ORAL_TABLET | Freq: Every day | ORAL | Status: DC

## 2023-07-30 MED ORDER — ACETAMINOPHEN 500 MG PO TABS: 1000.0000 mg | ORAL_TABLET | Freq: Three times a day (TID) | ORAL | 0 refills | Status: DC | PRN

## 2023-07-30 MED ORDER — LACTULOSE 20 GM/30ML PO SOLN: 15.0000 mL | Freq: Every day | ORAL | Status: DC | PRN

## 2023-07-30 NOTE — TOC Transition Note (Signed)
Transition of Care [facility]) - CM/SW Discharge Note   Patient Details  Name: Ryan Pace MRN: [id number] Date of Birth: 1929-08-05  Transition of Care [facility]) CM/SW Contact:  Allena Katz, LCSW Phone Number: 07/30/2023, 10:23 AM   Clinical Narrative:   Pt discharging back to [facility] for LTC. CSW spoke with Gild[facility] Griffes who states they will provide patients oxygen. RN given number for report. Son notified and he will transport pt when he arrives.     Final next level of care: Long Term Nursing Home Barriers to Discharge: Barriers Resolved   Patient Goals and CMS Choice CMS Medicare.gov Compare Post Acute Care list provided to:: Patient    Discharge Placement                    Name of family member notified: Son Patient and family notified of of transfer: 07/30/23  Discharge Plan and Services Additional resources added to the After Visit Summary for                                       Social Determinants of Health (SDOH) Interventions SDOH Screenings   Food Insecurity: No Food Insecurity (07/28/2023)  Housing: Patient Declined (07/28/2023)  Transportation Needs: No Transportation Needs (07/28/2023)  Utilities: Not At Risk (07/28/2023)  Tobacco Use: Medium Risk (07/29/2023)     Readmission Risk Interventions    07/29/2023    2:40 PM  Readmission Risk Prevention Plan  Transportation Screening Complete  Palliative Care Screening Complete  Medication Review (RN Care Manager) Complete

## 2023-07-30 NOTE — Progress Notes (Signed)
SATURATION QUALIFICATIONS: (This note is used to comply with regulatory documentation for home oxygen)  Patient Saturations on Room Air at Rest = 93%  Patient Saturations on Room Air while Ambulating = 81%  Patient Saturations on 4 Liters of oxygen while Ambulating = 91%  Please briefly explain why patient needs home oxygen:

## 2023-07-30 NOTE — Discharge Summary (Signed)
Physician Discharge Summary  Patient: Ryan Pace ZOX:[id number] DOB: 01/11/1929   Code Status: Limited: Do not attempt resuscitation (DNR) -DNR-LIMITED -Do Not Intubate/DNI  Admit date: 07/28/2023 Discharge date: 07/30/2023 Disposition: Skilled nursing facility, No home health services recommended PCP: Lauro Regulus, MD  Recommendations for Outpatient Follow-up:  Follow up with PCP within 1-2 weeks Regarding general hospital follow up and preventative care Recommend evaluating oxygen saturation with ambulation and discontinuing oxygen as needed Per patient request, sertraline is being tapered down for goal of stopping it within a few weeks. He expressed desire to lessen his pill burden. Atorvastatin was also stopped Recommend CBC to verify that hgb continues to improve. Hgb 10.5 on day of discharge and no signs of active bleeding  Discharge Diagnoses:  Principal Problem:   Multilobar lung infiltrate Active Problems:   Acute kidney injury superimposed on CKD (HCC)   Paroxysmal A-fib (HCC)   Essential hypertension   S/P TKR (total knee replacement) using cement, left   Osteoarthritis of left knee   Hyperlipidemia, mixed   Insomnia with sleep apnea   Obstructive sleep apnea   CKD stage 3a, GFR 45-59 ml/min (HCC)   Bronchitis   Chills   Community acquired pneumonia of right middle lobe of lung   Hypoxemia   Subjective fever  Brief Hospital Course Summary: Ryan Pace is a 89 y.o. male with a PMH significant for paroxysmal atrial fibrillation on Eliquis, depression, anxiety, hypertension, hyperlipidemia.   They presented from [facility] to the ED on 07/28/2023 with tremors and fever.   In the ED, it was found that they had temperature of 98.4, respiration rate of 19, heart rate 77, blood pressure 140/82, SpO2 92% on room air.  Significant findings included sodium is 137, potassium 4.1, chloride 96, bicarb 29, BUN of 41, serum creatinine 1.88, EGFR of 33,  nonfasting glucose 130, WBC 13.3, hemoglobin 12.2, platelets of 170. COVID/influenza A/influenza B/RSV PCR were negative.   ED treatment: Levofloxacin 750 mg p.o. one-time dose ordered Patient was a possible discharge from the ED however with ambulation, patient desatted to 85% on room air.  Patient had no prior oxygen supplementation requirement and no requirement at rest.    On day of discharge, patient shows continued improvement as he has no SOB with rest or ambulation. Unfortunately, with ambulation he continued to have desaturations to 81% and required 4L  while ambulating to maintain saturations >90%. He did not require oxygen while resting or sleeping. Patient desires to be discharged home with oxygen as he feels well and is looking forward to his wife's birthday party this week. He expressed understanding that he will need PCP follow up in 1-2 weeks to recheck if the oxygen can be discontinued and that he should wear it when ambulating or feeling SOB.  He will continue oral antibiotics.   He requested decreasing his pill burden so atorvastatin was discontinued, sertraline was decreased with plans to discontinue when tapered.  Tylenol was changed to PRN.  Transitioned iron to every other day and take with vitamin C such as juice or fruit to help with absorption. He suffers from constipation chronically so we added as needed lactulose.   Discharge Condition: Good, improved Recommended discharge diet: Regular healthy diet. Include vitamin C rich foods when administering iron or protein heavy foods.   Consultations: None   Procedures/Studies: None   Allergies as of 07/30/2023       Reactions   Penicillins Rash   Tolerated  ceftriaxone in Jan and Aug 2024.  Also received cephalexin in Aug 2024        Medication List     STOP taking these medications    atorvastatin 40 MG tablet Commonly known as: LIPITOR   docusate sodium 100 MG capsule Commonly known as: COLACE        TAKE these medications    acetaminophen 500 MG tablet Commonly known as: TYLENOL Take 2 tablets (1,000 mg total) by mouth every 8 (eight) hours as needed for mild pain (pain score 1-3). What changed:  when to take this reasons to take this   apixaban 2.5 MG Tabs tablet Commonly known as: ELIQUIS Take 2.5 mg by mouth 2 (two) times daily.   azithromycin 500 MG tablet Commonly known as: Zithromax Take 1 tablet (500 mg total) by mouth daily for 4 days.   bisacodyl 10 MG suppository Commonly known as: DULCOLAX Place 10 mg rectally daily as needed for moderate constipation.   cephALEXin 500 MG capsule Commonly known as: KEFLEX Take 1 capsule (500 mg total) by mouth 2 (two) times daily for 5 days.   cholecalciferol 1000 units tablet Commonly known as: VITAMIN D Take 1,000 Units by mouth daily.   cyclobenzaprine 5 MG tablet Commonly known as: FLEXERIL Take 5 mg by mouth every 8 (eight) hours as needed for muscle spasms.   ferrous sulfate 325 (65 FE) MG EC tablet Take 325 mg by mouth every other day.   gabapentin 100 MG capsule Commonly known as: NEURONTIN Take 200 mg by mouth at bedtime.   guaiFENesin 100 MG/5ML liquid Commonly known as: ROBITUSSIN Take 10 mLs by mouth every 4 (four) hours as needed for cough or to loosen phlegm.   Lactulose 20 GM/30ML Soln Take 15 mLs (10 g total) by mouth daily as needed.   levofloxacin 750 MG tablet Commonly known as: Levaquin Take 1 tablet (750 mg total) by mouth daily for 7 days.   loratadine 10 MG tablet Commonly known as: CLARITIN Take 10 mg by mouth daily.   magnesium hydroxide 400 MG/5ML suspension Commonly known as: MILK OF MAGNESIA Take 30 mLs by mouth every 4 (four) hours as needed for moderate constipation.   nystatin cream Commonly known as: MYCOSTATIN Apply 1 Application topically 2 (two) times daily as needed (rash). (Apply to groin folds)   polyethylene glycol 17 g packet Commonly known as: MIRALAX /  GLYCOLAX Take 17 g by mouth 2 (two) times daily as needed for moderate constipation or severe constipation.   potassium chloride 10 MEQ CR capsule Commonly known as: MICRO-K Take 10 mEq by mouth daily.   sertraline 25 MG tablet Commonly known as: ZOLOFT Take 0.5 tablets (12.5 mg total) by mouth at bedtime. What changed: how much to take   torsemide 100 MG tablet Commonly known as: DEMADEX Take 100 mg by mouth daily.   traZODone 100 MG tablet Commonly known as: DESYREL Take 200 mg by mouth at bedtime.   verapamil 360 MG 24 hr capsule Commonly known as: VERELAN Take 360 mg by mouth at bedtime.   zinc oxide 20 % ointment Apply 1 Application topically as needed for irritation.               Durable Medical Equipment  (From admission, onward)           Start     Ordered   07/30/23 1008  For home use only DME oxygen  Once       Comments: Required for  exertion. Not required at rest  Question Answer Comment  Length of Need 6 Months   Mode or (Route) Nasal cannula   Liters per Minute 4   Frequency Continuous (stationary and portable oxygen unit needed)   Oxygen conserving device Yes   Oxygen delivery system Gas      07/30/23 1007             Subjective   Ryan Pace reports feeling well. Denies dyspnea while ambulating today. Has some mild fatigue and residual dry cough. Has chronic constipation without changes. He prefers to go home with oxygen with exertion rather than stay another night.   All questions and concerns were addressed at time of discharge.  Objective  Blood pressure 121/60, pulse 73, temperature (!) 97.4 F (36.3 C), resp. rate 16, height 6' (1.829 m), weight 92.3 kg, SpO2 94%.   General: Ryan Pace is alert, awake, not in acute distress. Mild dysarthria Cardiovascular: RRR, S1/S2 +, no rubs, no gallops. Systolic murmur Respiratory: mild decreased aeration at bilateral bases. no wheezing, no rhonchi Abdominal: Soft, NT, ND, bowel sounds + Extremities:  bilateral LE edema, no cyanosis  The results of significant diagnostics from this hospitalization (including imaging, microbiology, ancillary and laboratory) are listed below for reference.   Imaging studies: CT Chest Wo Contrast  Result Date: 07/28/2023 CLINICAL DATA:  Respiratory illness, nondiagnostic xray EXAM: CT CHEST WITHOUT CONTRAST TECHNIQUE: Multidetector CT imaging of the chest was performed following the standard protocol without IV contrast. RADIATION DOSE REDUCTION: This exam was performed according to the departmental dose-optimization program which includes automated exposure control, adjustment of the mA and/or kV according to patient size and/or use of iterative reconstruction technique. COMPARISON:  Same-day x-ray FINDINGS: Cardiovascular: Normal heart size. No pericardial effusion. Thoracic aorta is nonaneurysmal. Scattered atherosclerotic vascular calcifications of the aorta and coronary arteries. Central pulmonary vasculature is within normal limits. Mediastinum/Nodes: No enlarged mediastinal or axillary lymph nodes. Thyroid gland, trachea, and esophagus demonstrate no significant findings. Lungs/Pleura: Patchy airspace consolidation and ground-glass opacity within the dependent portions of the right middle lobe and right lower lobes. Similar, although less prominent findings are present within the left lower lobe. Biapical pleuroparenchymal scarring. The upper lung fields are otherwise clear. Specifically, no right upper lobe pulmonary nodule. No pleural effusion or pneumothorax. Upper Abdomen: Nonobstructing bilateral renal calculi. Musculoskeletal: Subacute appearing minimally displaced fracture of the posterior right eleventh rib. Healing nondisplaced fracture of the posterior right ninth rib. No chest wall hematoma or other abnormality. No subcutaneous emphysema. IMPRESSION: 1. Patchy airspace consolidation and ground-glass opacity within the dependent portions of the right middle  lobe and right lower lobes. Similar, although less prominent findings are present within the left lower lobe. Findings are most compatible with multifocal pneumonia or possibly aspiration. 2. Subacute appearing minimally displaced fracture of the posterior right eleventh rib. Healing nondisplaced fracture of the posterior right ninth rib. 3. Nonobstructing bilateral renal calculi. 4. Aortic and coronary artery atherosclerosis (ICD10-I70.0). Electronically Signed   By: Duanne Guess D.O.   On: 07/28/2023 16:58   DG Chest 2 View  Result Date: 07/28/2023 CLINICAL DATA:  Shortness of breath EXAM: CHEST - 2 VIEW COMPARISON:  09/02/2022 FINDINGS: The heart size and mediastinal contours are within normal limits. Slightly low lung volumes. Subtle nodular density projecting within the right upper lobe. Lungs are otherwise clear. No pleural effusion or pneumothorax. The visualized skeletal structures are unremarkable. IMPRESSION: Subtle nodular density projecting within the right upper lobe. Lungs appear otherwise clear. A follow-up CT  could be considered, if deemed clinically warranted. Electronically Signed   By: Duanne Guess D.O.   On: 07/28/2023 15:40    Labs: Basic Metabolic Panel: Recent Labs  Lab 07/28/23 1454 07/29/23 0600 07/30/23 0454  NA 137 138 138  K 4.1 4.0 3.7  CL 96* 98 102  CO2 29 32 28  GLUCOSE 130* 104* 113*  BUN 41* 39* 40*  CREATININE 1.88* 1.91* 1.69*  CALCIUM 8.7* 8.7* 9.0   CBC: Recent Labs  Lab 07/28/23 1454 07/29/23 0600 07/30/23 0454  WBC 13.3* 14.5* 10.9*  HGB 12.2* 9.8* 10.5*  HCT 37.8* 30.1* 31.3*  MCV 93.8 93.5 91.0  PLT 178 143* 179   Microbiology: Results for orders placed or performed during the hospital encounter of 07/28/23  Resp panel by RT-PCR (RSV, Flu A&B, Covid) Anterior Nasal Swab     Status: None   Collection Time: 07/28/23  4:17 PM   Specimen: Anterior Nasal Swab  Result Value Ref Range Status   SARS Coronavirus 2 by RT PCR NEGATIVE  NEGATIVE Final    Comment: (NOTE) SARS-CoV-2 target nucleic acids are NOT DETECTED.  The SARS-CoV-2 RNA is generally detectable in upper respiratory specimens during the acute phase of infection. The lowest concentration of SARS-CoV-2 viral copies this assay can detect is 138 copies/mL. A negative result does not preclude SARS-Cov-2 infection and should not be used as the sole basis for treatment or other patient management decisions. A negative result may occur with  improper specimen collection/handling, submission of specimen other than nasopharyngeal swab, presence of viral mutation(s) within the areas targeted by this assay, and inadequate number of viral copies(<138 copies/mL). A negative result must be combined with clinical observations, patient history, and epidemiological information. The expected result is Negative.  Fact Sheet for Patients:  BloggerCourse.com  Fact Sheet for Healthcare Providers:  SeriousBroker.it  This test is no t yet approved or cleared by the [place] FDA and  has been authorized for detection and/or diagnosis of SARS-CoV-2 by FDA under an Emergency Use Authorization (EUA). This EUA will remain  in effect (meaning this test can be used) for the duration of the COVID-19 declaration under Section 564(b)(1) of the Act, 21 U.S.C.section 360bbb-3(b)(1), unless the authorization is terminated  or revoked sooner.       Influenza A by PCR NEGATIVE NEGATIVE Final   Influenza B by PCR NEGATIVE NEGATIVE Final    Comment: (NOTE) The Xpert Xpress SARS-CoV-2/FLU/RSV plus assay is intended as an aid in the diagnosis of influenza from Nasopharyngeal swab specimens and should not be used as a sole basis for treatment. Nasal washings and aspirates are unacceptable for Xpert Xpress SARS-CoV-2/FLU/RSV testing.  Fact Sheet for Patients: BloggerCourse.com  Fact Sheet for Healthcare  Providers: SeriousBroker.it  This test is not yet approved or cleared by the [place] FDA and has been authorized for detection and/or diagnosis of SARS-CoV-2 by FDA under an Emergency Use Authorization (EUA). This EUA will remain in effect (meaning this test can be used) for the duration of the COVID-19 declaration under Section 564(b)(1) of the Act, 21 U.S.C. section 360bbb-3(b)(1), unless the authorization is terminated or revoked.     Resp Syncytial Virus by PCR NEGATIVE NEGATIVE Final    Comment: (NOTE) Fact Sheet for Patients: BloggerCourse.com  Fact Sheet for Healthcare Providers: SeriousBroker.it  This test is not yet approved or cleared by the [place] FDA and has been authorized for detection and/or diagnosis of SARS-CoV-2 by FDA under an Emergency Use Authorization (  EUA). This EUA will remain in effect (meaning this test can be used) for the duration of the COVID-19 declaration under Section 564(b)(1) of the Act, 21 U.S.C. section 360bbb-3(b)(1), unless the authorization is terminated or revoked.  Performed at [facility], [address] [place], [place] [address]   Blood Culture (routine x 2)     Status: None (Preliminary result)   Collection Time: 07/28/23  6:05 PM   Specimen: BLOOD  Result Value Ref Range Status   Specimen Description BLOOD BLOOD LEFT ARM  Final   Special Requests   Final    BOTTLES DRAWN AEROBIC AND ANAEROBIC Blood Culture results may not be optimal due to an excessive volume of blood received in culture bottles   Culture   Final    NO GROWTH 2 DAYS Performed at [facility], [address], [place], [place] [address]    Report Status PENDING  Incomplete  Blood Culture (routine x 2)     Status: None (Preliminary result)   Collection Time: 07/28/23  6:05 PM   Specimen: BLOOD  Result Value Ref Range Status   Specimen Description BLOOD  BLOOD RIGHT ARM  Final   Special Requests   Final    BOTTLES DRAWN AEROBIC AND ANAEROBIC Blood Culture adequate volume   Culture   Final    NO GROWTH 2 DAYS Performed at [facility], [address] [place], [place] [address]    Report Status PENDING  Incomplete   Time coordinating discharge: Over 30 minutes  Leeroy Bock, MD  [facility] 07/30/2023, 10:12 AM

## 2023-07-30 NOTE — Progress Notes (Incomplete)
PROGRESS NOTE  Ryan Pace    DOB: [birth year], 89 y.o.  NWG:[id number]    Code Status: Limited: Do not attempt resuscitation (DNR) -DNR-LIMITED -Do Not Intubate/DNI    DOA: 07/28/2023   LOS: 2   Brief hospital course  Ryan Pace is a 89 y.o. male with a PMH significant for paroxysmal atrial fibrillation on Eliquis, depression, anxiety, hypertension, hyperlipidemia.  They presented from [facility] to the ED on 07/28/2023 with tremors and fever.  In the ED, it was found that they had temperature of 98.4, respiration rate of 19, heart rate 77, blood pressure 140/82, SpO2 92% on room air.  Significant findings included sodium is 137, potassium 4.1, chloride 96, bicarb 29, BUN of 41, serum creatinine 1.88, EGFR of 33, nonfasting glucose 130, WBC 13.3, hemoglobin 12.2, platelets of 170.   COVID/influenza A/influenza B/RSV PCR were negative.   ED treatment: Levofloxacin 750 mg p.o. one-time dose ordered   Patient was a possible discharge from the ED however with ambulation, patient desatted to 85% on room air.  Patient had no prior oxygen supplementation requirement..  Patient was admitted to medicine service for further workup and management of multifocal pneumonia as outlined in detail below.  07/30/23 -improvement. Weaned from oxygen. Wants to get home before Friday to celebrate his wife's 90th birthday.   Assessment & Plan  Principal Problem:   Multilobar lung infiltrate Active Problems:   Acute kidney injury superimposed on CKD (HCC)   Paroxysmal A-fib (HCC)   Essential hypertension   S/P TKR (total knee replacement) using cement, left   Osteoarthritis of left knee   Hyperlipidemia, mixed   Insomnia with sleep apnea   Obstructive sleep apnea   CKD stage 3a, GFR 45-59 ml/min (HCC)   Bronchitis   Chills   Community acquired pneumonia of right middle lobe of lung   Hypoxemia   Subjective fever  Multilobar lung infiltrate- stable ORA while resting. Will ambulate  in the am to see if desats.  - continue IV azithromycin and CTX Flutter valve and incentive spirometry - SLP consult ordered per request of his facility, presumably concerned for aspiration   Acute kidney injury superimposed on CKD IIIa (HCC) Cr 1.91 today is worsened from baseline around 1.3 S/p IV fluids - recheck am and encourage PO fluid intake.   Normocytic anemia- suspect dilutional. No signs of acute bleed. If he does have an occult GI bleed, could lead to SOB so will monitor closely - CBC am   Paroxysmal A-fib (HCC) - continue Home Eliquis 2.5 mg p.o. twice daily, metoprolol 12.5 mg   Essential hypertension Home lisinopril 20 mg daily not resumed on admission due to acute kidney injury Home metoprolol tartrate 12.5 mg p.o. twice daily, verapamil 360 mg SR nightly or resumed   Obstructive sleep apnea CPAP nightly ordered   Insomnia with sleep apnea Home trazodone 150 mg nightly as needed for sleep with additional link ordered to add another 50 mg nightly to help patient's sleep, per dispense history   Hyperlipidemia, mixed Atorvastatin 40 mg daily resumed  Body mass index is 27.6 kg/m.  VTE ppx: apixaban (ELIQUIS) tablet 2.5 mg Start: 07/28/23 2200 Place TED hose Start: 07/28/23 1744 apixaban (ELIQUIS) tablet 2.5 mg   Diet:     Diet   Diet Heart Room service appropriate? Yes; Fluid consistency: Thin   Consultants: None   Subjective 07/30/23    Pt reports feeling mentally at his baseline but his body is tired. Has no  dyspnea. Persistent dry cough.    Objective   Vitals:   07/29/23 0913 07/29/23 2005 07/30/23 0000 07/30/23 0450  BP:  135/74 113/62 (!) 98/48  Pulse: 75 95 69 63  Resp:  18  16  Temp:  97.9 F (36.6 C) 97.8 F (36.6 C) (!) 97.5 F (36.4 C)  TempSrc:   Axillary   SpO2:  95% 92% 90%  Weight:      Height:        Intake/Output Summary (Last 24 hours) at 07/30/2023 0822 Last data filed at 07/30/2023 0549 Gross per 24 hour  Intake 694.78  ml  Output 500 ml  Net 194.78 ml   Filed Weights   07/28/23 1828 07/28/23 2122  Weight: 88.5 kg 92.3 kg    Physical Exam:  General: awake, alert, NAD HEENT: atraumatic, clear conjunctiva, anicteric sclera, MMM, hearing grossly normal Respiratory: normal respiratory effort. No wheezing, CTAB Cardiovascular: quick capillary refill, systolic murmur Nervous: A&O x3. no gross focal neurologic deficits, speech audible with some tremorous voice. He was able to stand and pivot independently with walker.  Extremities: moves all equally, no edema, normal tone Skin: dry, intact, normal temperature, normal color. No rashes, lesions or ulcers on exposed skin Psychiatry: normal mood, congruent affect  Labs   I have personally reviewed the following labs and imaging studies CBC    Component Value Date/Time   WBC 10.9 (H) 07/30/2023 0454   RBC 3.44 (L) 07/30/2023 0454   HGB 10.5 (L) 07/30/2023 0454   HCT 31.3 (L) 07/30/2023 0454   PLT 179 07/30/2023 0454   MCV 91.0 07/30/2023 0454   MCH 30.5 07/30/2023 0454   MCHC 33.5 07/30/2023 0454   RDW 13.3 07/30/2023 0454   LYMPHSABS 1.4 04/11/2023 0501   MONOABS 0.6 04/11/2023 0501   EOSABS 0.1 04/11/2023 0501   BASOSABS 0.0 04/11/2023 0501      Latest Ref Rng & Units 07/30/2023    4:54 AM 07/29/2023    6:00 AM 07/28/2023    2:54 PM  BMP  Glucose 70 - 99 mg/dL 109  604  540   BUN 8 - 23 mg/dL 40  39  41   Creatinine 0.61 - 1.24 mg/dL 9.81  1.91  4.78   Sodium 135 - 145 mmol/L 138  138  137   Potassium 3.5 - 5.1 mmol/L 3.7  4.0  4.1   Chloride 98 - 111 mmol/L 102  98  96   CO2 22 - 32 mmol/L 28  32  29   Calcium 8.9 - 10.3 mg/dL 9.0  8.7  8.7     CT Chest Wo Contrast  Result Date: 07/28/2023 CLINICAL DATA:  Respiratory illness, nondiagnostic xray EXAM: CT CHEST WITHOUT CONTRAST TECHNIQUE: Multidetector CT imaging of the chest was performed following the standard protocol without IV contrast. RADIATION DOSE REDUCTION: This exam was performed  according to the departmental dose-optimization program which includes automated exposure control, adjustment of the mA and/or kV according to patient size and/or use of iterative reconstruction technique. COMPARISON:  Same-day x-ray FINDINGS: Cardiovascular: Normal heart size. No pericardial effusion. Thoracic aorta is nonaneurysmal. Scattered atherosclerotic vascular calcifications of the aorta and coronary arteries. Central pulmonary vasculature is within normal limits. Mediastinum/Nodes: No enlarged mediastinal or axillary lymph nodes. Thyroid gland, trachea, and esophagus demonstrate no significant findings. Lungs/Pleura: Patchy airspace consolidation and ground-glass opacity within the dependent portions of the right middle lobe and right lower lobes. Similar, although less prominent findings are present within the left lower  lobe. Biapical pleuroparenchymal scarring. The upper lung fields are otherwise clear. Specifically, no right upper lobe pulmonary nodule. No pleural effusion or pneumothorax. Upper Abdomen: Nonobstructing bilateral renal calculi. Musculoskeletal: Subacute appearing minimally displaced fracture of the posterior right eleventh rib. Healing nondisplaced fracture of the posterior right ninth rib. No chest wall hematoma or other abnormality. No subcutaneous emphysema. IMPRESSION: 1. Patchy airspace consolidation and ground-glass opacity within the dependent portions of the right middle lobe and right lower lobes. Similar, although less prominent findings are present within the left lower lobe. Findings are most compatible with multifocal pneumonia or possibly aspiration. 2. Subacute appearing minimally displaced fracture of the posterior right eleventh rib. Healing nondisplaced fracture of the posterior right ninth rib. 3. Nonobstructing bilateral renal calculi. 4. Aortic and coronary artery atherosclerosis (ICD10-I70.0). Electronically Signed   By: Duanne Guess D.O.   On: 07/28/2023 16:58    DG Chest 2 View  Result Date: 07/28/2023 CLINICAL DATA:  Shortness of breath EXAM: CHEST - 2 VIEW COMPARISON:  09/02/2022 FINDINGS: The heart size and mediastinal contours are within normal limits. Slightly low lung volumes. Subtle nodular density projecting within the right upper lobe. Lungs are otherwise clear. No pleural effusion or pneumothorax. The visualized skeletal structures are unremarkable. IMPRESSION: Subtle nodular density projecting within the right upper lobe. Lungs appear otherwise clear. A follow-up CT could be considered, if deemed clinically warranted. Electronically Signed   By: Duanne Guess D.O.   On: 07/28/2023 15:40    Disposition Plan & Communication  Patient status: Inpatient  Admitted From: ALF Planned disposition location: Assisted living Anticipated discharge date: 12/3 pending ambulate without desat  Family Communication: son at bedside    Author: Leeroy Bock, DO [facility] 07/30/2023, 8:22 AM   Available by Epic secure chat 7AM-7PM. If 7PM-7AM, please contact night-coverage.  [facility] contact information found on [email or web address].

## 2023-07-30 NOTE — Progress Notes (Signed)
Report given to Delice Bison at facility, awaiting son for transport

## 2023-07-30 NOTE — NC FL2 (Addendum)
[place] MEDICAID FL2 LEVEL OF CARE FORM     IDENTIFICATION  Patient Name: Ryan Pace Birthdate: [birth year] Sex: male Admission Date (Current Location): 07/28/2023  [facility] and [place] Number:  Chiropodist and Address:        [place] Number: [phone]  Attending Physician Name and Address:  Leeroy Bock, MD  Relative Name and Phone Number:       Current Level of Care: Hospital Recommended Level of Care:  (LTC) Prior Approval Number:    Date Approved/Denied:   PASRR Number: [id number]  Discharge Plan: Domiciliary (Rest home)    Current Diagnoses: Patient Active Problem List   Diagnosis Date Noted   Bronchitis 07/29/2023   Chills 07/29/2023   Community acquired pneumonia of right middle lobe of lung 07/29/2023   Hypoxemia 07/29/2023   Subjective fever 07/29/2023   Multilobar lung infiltrate 07/28/2023   CKD stage 3a, GFR 45-59 ml/min (HCC) 07/28/2023   Embolic stroke involving left anterior cerebral artery (HCC) 04/11/2023   Acute kidney injury superimposed on CKD (HCC) 04/11/2023   Overweight (BMI 25.0-29.9) 04/11/2023   Skin lesion 04/11/2023   Generalized weakness 04/09/2023   Orthostatic hypotension 04/09/2023   Cellulitis 04/09/2023   Bilateral lower extremity edema 04/09/2023   Obstructive sleep apnea 04/18/2020   S/P TKR (total knee replacement) using cement, left 10/29/2018   Essential hypertension 10/08/2018   Osteoarthritis of left knee 09/19/2018   Hematoma 06/08/2018   Seborrheic dermatitis 05/28/2018   Dupuytren's contracture 12/12/2015   Insomnia with sleep apnea 10/12/2015   Hyperlipidemia, mixed 03/09/2015   Moderate mitral insufficiency 09/06/2014   Paroxysmal A-fib (HCC) 09/06/2014   CAD (coronary artery disease), native coronary artery 02/05/2014    Orientation RESPIRATION BLADDER Height & Weight     Self, Situation, Time, Place  O2 Continent Weight: 203 lb 7.8 oz (92.3 kg) Height:  6'  (182.9 cm)  BEHAVIORAL SYMPTOMS/MOOD NEUROLOGICAL BOWEL NUTRITION STATUS      Continent    AMBULATORY STATUS COMMUNICATION OF NEEDS Skin     Verbally Normal                       Personal Care Assistance Level of Assistance              Functional Limitations Info  Sight, Hearing, Speech Sight Info: Impaired Hearing Info: Adequate Speech Info: Adequate    SPECIAL CARE FACTORS FREQUENCY                       Contractures Contractures Info: Not present    Additional Factors Info  Code Status, Allergies Code Status Info: DNR-limited Allergies Info: Penicillins           Current Medications (07/30/2023):  This is the current hospital active medication list Current Facility-Administered Medications  Medication Dose Route Frequency Provider Last Rate Last Admin   acetaminophen (TYLENOL) tablet 650 mg  650 mg Oral Q6H PRN Cox, Amy N, DO   650 mg at 07/29/23 2234   Or   acetaminophen (TYLENOL) suppository 650 mg  650 mg Rectal Q6H PRN Cox, Amy N, DO       apixaban (ELIQUIS) tablet 2.5 mg  2.5 mg Oral BID Cox, Amy N, DO   2.5 mg at 07/30/23 0909   atorvastatin (LIPITOR) tablet 40 mg  40 mg Oral QHS Cox, Amy N, DO   40 mg at 07/29/23 2225   azithromycin (ZITHROMAX) 500 mg in dextrose  5 % 250 mL IVPB  500 mg Intravenous Q24H Foye Deer, [facility]   Stopped at 07/29/23 1743   bisacodyl (DULCOLAX) suppository 10 mg  10 mg Rectal Daily PRN Cox, Amy N, DO       cefTRIAXone (ROCEPHIN) 2 g in sodium chloride 0.9 % 100 mL IVPB  2 g Intravenous Q24H Leeroy Bock, MD   Stopped at 07/29/23 2339   cholecalciferol (VITAMIN D3) 25 MCG (1000 UNIT) tablet 1,000 Units  1,000 Units Oral Daily Cox, Amy N, DO   1,000 Units at 07/30/23 0909   docusate sodium (COLACE) capsule 100 mg  100 mg Oral BID Cox, Amy N, DO   100 mg at 07/30/23 1610   ferrous sulfate tablet 325 mg  325 mg Oral QODAY Cox, Amy N, DO   325 mg at 07/30/23 9604   gabapentin (NEURONTIN) capsule 200 mg  200 mg Oral  QHS Cox, Amy N, DO   200 mg at 07/29/23 2225   guaiFENesin (ROBITUSSIN) 100 MG/5ML liquid 5 mL  5 mL Oral Q6H PRN Cox, Amy N, DO   5 mL at 07/30/23 0408   metoprolol tartrate (LOPRESSOR) tablet 12.5 mg  12.5 mg Oral BID Cox, Amy N, DO   12.5 mg at 07/30/23 5409   nystatin cream (MYCOSTATIN) 1 Application  1 Application Topical BID PRN Cox, Amy N, DO       ondansetron (ZOFRAN) tablet 4 mg  4 mg Oral Q6H PRN Cox, Amy N, DO       Or   ondansetron (ZOFRAN) injection 4 mg  4 mg Intravenous Q6H PRN Cox, Amy N, DO       polyethylene glycol (MIRALAX / GLYCOLAX) packet 17 g  17 g Oral BID PRN Cox, Amy N, DO       sertraline (ZOLOFT) tablet 25 mg  25 mg Oral QHS Cox, Amy N, DO   25 mg at 07/29/23 2226   traZODone (DESYREL) tablet 150 mg  150 mg Oral QHS PRN Cox, Amy N, DO   150 mg at 07/29/23 2235   And   traZODone (DESYREL) tablet 50 mg  50 mg Oral QHS PRN Cox, Amy N, DO   50 mg at 07/29/23 2241   verapamil (CALAN-SR) CR tablet 360 mg  360 mg Oral QHS Cox, Amy N, DO   360 mg at 07/29/23 2233     Discharge Medications: Please see discharge summary for a list of discharge medications.  STOP taking these medications     atorvastatin 40 MG tablet Commonly known as: LIPITOR    docusate sodium 100 MG capsule Commonly known as: COLACE           TAKE these medications     acetaminophen 500 MG tablet Commonly known as: TYLENOL Take 2 tablets (1,000 mg total) by mouth every 8 (eight) hours as needed for mild pain (pain score 1-3). What changed:  when to take this reasons to take this    apixaban 2.5 MG Tabs tablet Commonly known as: ELIQUIS Take 2.5 mg by mouth 2 (two) times daily.    azithromycin 500 MG tablet Commonly known as: Zithromax Take 1 tablet (500 mg total) by mouth daily for 4 days.    bisacodyl 10 MG suppository Commonly known as: DULCOLAX Place 10 mg rectally daily as needed for moderate constipation.    cephALEXin 500 MG capsule Commonly known as: KEFLEX Take 1  capsule (500 mg total) by mouth 2 (two) times daily for 5 days.  cholecalciferol 1000 units tablet Commonly known as: VITAMIN D Take 1,000 Units by mouth daily.    cyclobenzaprine 5 MG tablet Commonly known as: FLEXERIL Take 5 mg by mouth every 8 (eight) hours as needed for muscle spasms.    ferrous sulfate 325 (65 FE) MG EC tablet Take 325 mg by mouth every other day.    gabapentin 100 MG capsule Commonly known as: NEURONTIN Take 200 mg by mouth at bedtime.    guaiFENesin 100 MG/5ML liquid Commonly known as: ROBITUSSIN Take 10 mLs by mouth every 4 (four) hours as needed for cough or to loosen phlegm.    Lactulose 20 GM/30ML Soln Take 15 mLs (10 g total) by mouth daily as needed.    levofloxacin 750 MG tablet Commonly known as: Levaquin Take 1 tablet (750 mg total) by mouth daily for 7 days.    loratadine 10 MG tablet Commonly known as: CLARITIN Take 10 mg by mouth daily.    magnesium hydroxide 400 MG/5ML suspension Commonly known as: MILK OF MAGNESIA Take 30 mLs by mouth every 4 (four) hours as needed for moderate constipation.    nystatin cream Commonly known as: MYCOSTATIN Apply 1 Application topically 2 (two) times daily as needed (rash). (Apply to groin folds)    polyethylene glycol 17 g packet Commonly known as: MIRALAX / GLYCOLAX Take 17 g by mouth 2 (two) times daily as needed for moderate constipation or severe constipation.    potassium chloride 10 MEQ CR capsule Commonly known as: MICRO-K Take 10 mEq by mouth daily.    sertraline 25 MG tablet Commonly known as: ZOLOFT Take 0.5 tablets (12.5 mg total) by mouth at bedtime. What changed: how much to take    torsemide 100 MG tablet Commonly known as: DEMADEX Take 100 mg by mouth daily.    traZODone 100 MG tablet Commonly known as: DESYREL Take 200 mg by mouth at bedtime.    verapamil 360 MG 24 hr capsule Commonly known as: VERELAN Take 360 mg by mouth at bedtime.    zinc oxide 20 %  ointment Apply 1 Application topically as needed for irritation.    Relevant Imaging Results:  Relevant Lab Results:   Additional Information SS [id number]  Allena Katz, LCSW

## 2023-07-31 DIAGNOSIS — J188 Other pneumonia, unspecified organism: Secondary | ICD-10-CM | POA: Diagnosis not present

## 2023-07-31 DIAGNOSIS — R2681 Unsteadiness on feet: Secondary | ICD-10-CM | POA: Diagnosis not present

## 2023-07-31 DIAGNOSIS — R296 Repeated falls: Secondary | ICD-10-CM | POA: Diagnosis not present

## 2023-07-31 DIAGNOSIS — I63422 Cerebral infarction due to embolism of left anterior cerebral artery: Secondary | ICD-10-CM | POA: Diagnosis not present

## 2023-07-31 DIAGNOSIS — M6281 Muscle weakness (generalized): Secondary | ICD-10-CM | POA: Diagnosis not present

## 2023-07-31 DIAGNOSIS — J9801 Acute bronchospasm: Secondary | ICD-10-CM | POA: Diagnosis not present

## 2023-07-31 DIAGNOSIS — F325 Major depressive disorder, single episode, in full remission: Secondary | ICD-10-CM | POA: Diagnosis not present

## 2023-07-31 DIAGNOSIS — I5032 Chronic diastolic (congestive) heart failure: Secondary | ICD-10-CM | POA: Diagnosis not present

## 2023-07-31 DIAGNOSIS — R1310 Dysphagia, unspecified: Secondary | ICD-10-CM | POA: Diagnosis not present

## 2023-07-31 DIAGNOSIS — R2689 Other abnormalities of gait and mobility: Secondary | ICD-10-CM | POA: Diagnosis not present

## 2023-07-31 DIAGNOSIS — J189 Pneumonia, unspecified organism: Secondary | ICD-10-CM | POA: Diagnosis not present

## 2023-08-01 DIAGNOSIS — R296 Repeated falls: Secondary | ICD-10-CM | POA: Diagnosis not present

## 2023-08-01 DIAGNOSIS — J188 Other pneumonia, unspecified organism: Secondary | ICD-10-CM | POA: Diagnosis not present

## 2023-08-01 DIAGNOSIS — R2681 Unsteadiness on feet: Secondary | ICD-10-CM | POA: Diagnosis not present

## 2023-08-01 DIAGNOSIS — J189 Pneumonia, unspecified organism: Secondary | ICD-10-CM | POA: Diagnosis not present

## 2023-08-01 DIAGNOSIS — R1310 Dysphagia, unspecified: Secondary | ICD-10-CM | POA: Diagnosis not present

## 2023-08-01 DIAGNOSIS — M6281 Muscle weakness (generalized): Secondary | ICD-10-CM | POA: Diagnosis not present

## 2023-08-01 DIAGNOSIS — I63422 Cerebral infarction due to embolism of left anterior cerebral artery: Secondary | ICD-10-CM | POA: Diagnosis not present

## 2023-08-01 DIAGNOSIS — R2689 Other abnormalities of gait and mobility: Secondary | ICD-10-CM | POA: Diagnosis not present

## 2023-08-02 ENCOUNTER — Other Ambulatory Visit: Payer: Self-pay | Admitting: Internal Medicine

## 2023-08-02 DIAGNOSIS — R296 Repeated falls: Secondary | ICD-10-CM | POA: Diagnosis not present

## 2023-08-02 DIAGNOSIS — I63422 Cerebral infarction due to embolism of left anterior cerebral artery: Secondary | ICD-10-CM | POA: Diagnosis not present

## 2023-08-02 DIAGNOSIS — J188 Other pneumonia, unspecified organism: Secondary | ICD-10-CM | POA: Diagnosis not present

## 2023-08-02 DIAGNOSIS — R2681 Unsteadiness on feet: Secondary | ICD-10-CM | POA: Diagnosis not present

## 2023-08-02 DIAGNOSIS — J189 Pneumonia, unspecified organism: Secondary | ICD-10-CM | POA: Diagnosis not present

## 2023-08-02 DIAGNOSIS — R058 Other specified cough: Secondary | ICD-10-CM

## 2023-08-02 DIAGNOSIS — Z8701 Personal history of pneumonia (recurrent): Secondary | ICD-10-CM

## 2023-08-02 DIAGNOSIS — M6281 Muscle weakness (generalized): Secondary | ICD-10-CM | POA: Diagnosis not present

## 2023-08-02 DIAGNOSIS — R1310 Dysphagia, unspecified: Secondary | ICD-10-CM

## 2023-08-02 DIAGNOSIS — R2689 Other abnormalities of gait and mobility: Secondary | ICD-10-CM | POA: Diagnosis not present

## 2023-08-02 LAB — CULTURE, BLOOD (ROUTINE X 2)
Culture: NO GROWTH
Culture: NO GROWTH
Special Requests: ADEQUATE

## 2023-08-05 DIAGNOSIS — R2681 Unsteadiness on feet: Secondary | ICD-10-CM | POA: Diagnosis not present

## 2023-08-05 DIAGNOSIS — J189 Pneumonia, unspecified organism: Secondary | ICD-10-CM | POA: Diagnosis not present

## 2023-08-05 DIAGNOSIS — R1310 Dysphagia, unspecified: Secondary | ICD-10-CM | POA: Diagnosis not present

## 2023-08-05 DIAGNOSIS — J188 Other pneumonia, unspecified organism: Secondary | ICD-10-CM | POA: Diagnosis not present

## 2023-08-05 DIAGNOSIS — M6281 Muscle weakness (generalized): Secondary | ICD-10-CM | POA: Diagnosis not present

## 2023-08-05 DIAGNOSIS — R2689 Other abnormalities of gait and mobility: Secondary | ICD-10-CM | POA: Diagnosis not present

## 2023-08-05 DIAGNOSIS — I63422 Cerebral infarction due to embolism of left anterior cerebral artery: Secondary | ICD-10-CM | POA: Diagnosis not present

## 2023-08-05 DIAGNOSIS — R296 Repeated falls: Secondary | ICD-10-CM | POA: Diagnosis not present

## 2023-08-06 DIAGNOSIS — J188 Other pneumonia, unspecified organism: Secondary | ICD-10-CM | POA: Diagnosis not present

## 2023-08-06 DIAGNOSIS — I63422 Cerebral infarction due to embolism of left anterior cerebral artery: Secondary | ICD-10-CM | POA: Diagnosis not present

## 2023-08-06 DIAGNOSIS — R2689 Other abnormalities of gait and mobility: Secondary | ICD-10-CM | POA: Diagnosis not present

## 2023-08-06 DIAGNOSIS — J189 Pneumonia, unspecified organism: Secondary | ICD-10-CM | POA: Diagnosis not present

## 2023-08-06 DIAGNOSIS — R296 Repeated falls: Secondary | ICD-10-CM | POA: Diagnosis not present

## 2023-08-06 DIAGNOSIS — R2681 Unsteadiness on feet: Secondary | ICD-10-CM | POA: Diagnosis not present

## 2023-08-06 DIAGNOSIS — D649 Anemia, unspecified: Secondary | ICD-10-CM | POA: Diagnosis not present

## 2023-08-06 DIAGNOSIS — M6281 Muscle weakness (generalized): Secondary | ICD-10-CM | POA: Diagnosis not present

## 2023-08-06 DIAGNOSIS — R1310 Dysphagia, unspecified: Secondary | ICD-10-CM | POA: Diagnosis not present

## 2023-08-07 DIAGNOSIS — R296 Repeated falls: Secondary | ICD-10-CM | POA: Diagnosis not present

## 2023-08-07 DIAGNOSIS — J188 Other pneumonia, unspecified organism: Secondary | ICD-10-CM | POA: Diagnosis not present

## 2023-08-07 DIAGNOSIS — R2681 Unsteadiness on feet: Secondary | ICD-10-CM | POA: Diagnosis not present

## 2023-08-07 DIAGNOSIS — J189 Pneumonia, unspecified organism: Secondary | ICD-10-CM | POA: Diagnosis not present

## 2023-08-07 DIAGNOSIS — M6281 Muscle weakness (generalized): Secondary | ICD-10-CM | POA: Diagnosis not present

## 2023-08-07 DIAGNOSIS — R2689 Other abnormalities of gait and mobility: Secondary | ICD-10-CM | POA: Diagnosis not present

## 2023-08-07 DIAGNOSIS — I63422 Cerebral infarction due to embolism of left anterior cerebral artery: Secondary | ICD-10-CM | POA: Diagnosis not present

## 2023-08-07 DIAGNOSIS — R1310 Dysphagia, unspecified: Secondary | ICD-10-CM | POA: Diagnosis not present

## 2023-08-08 DIAGNOSIS — J188 Other pneumonia, unspecified organism: Secondary | ICD-10-CM | POA: Diagnosis not present

## 2023-08-08 DIAGNOSIS — I63422 Cerebral infarction due to embolism of left anterior cerebral artery: Secondary | ICD-10-CM | POA: Diagnosis not present

## 2023-08-08 DIAGNOSIS — R2681 Unsteadiness on feet: Secondary | ICD-10-CM | POA: Diagnosis not present

## 2023-08-08 DIAGNOSIS — M6281 Muscle weakness (generalized): Secondary | ICD-10-CM | POA: Diagnosis not present

## 2023-08-08 DIAGNOSIS — J189 Pneumonia, unspecified organism: Secondary | ICD-10-CM | POA: Diagnosis not present

## 2023-08-08 DIAGNOSIS — R1310 Dysphagia, unspecified: Secondary | ICD-10-CM | POA: Diagnosis not present

## 2023-08-08 DIAGNOSIS — R2689 Other abnormalities of gait and mobility: Secondary | ICD-10-CM | POA: Diagnosis not present

## 2023-08-08 DIAGNOSIS — R296 Repeated falls: Secondary | ICD-10-CM | POA: Diagnosis not present

## 2023-08-09 DIAGNOSIS — R1310 Dysphagia, unspecified: Secondary | ICD-10-CM | POA: Diagnosis not present

## 2023-08-09 DIAGNOSIS — R2689 Other abnormalities of gait and mobility: Secondary | ICD-10-CM | POA: Diagnosis not present

## 2023-08-09 DIAGNOSIS — J188 Other pneumonia, unspecified organism: Secondary | ICD-10-CM | POA: Diagnosis not present

## 2023-08-09 DIAGNOSIS — J189 Pneumonia, unspecified organism: Secondary | ICD-10-CM | POA: Diagnosis not present

## 2023-08-09 DIAGNOSIS — R296 Repeated falls: Secondary | ICD-10-CM | POA: Diagnosis not present

## 2023-08-09 DIAGNOSIS — M6281 Muscle weakness (generalized): Secondary | ICD-10-CM | POA: Diagnosis not present

## 2023-08-09 DIAGNOSIS — R2681 Unsteadiness on feet: Secondary | ICD-10-CM | POA: Diagnosis not present

## 2023-08-09 DIAGNOSIS — I63422 Cerebral infarction due to embolism of left anterior cerebral artery: Secondary | ICD-10-CM | POA: Diagnosis not present

## 2023-08-12 DIAGNOSIS — R2681 Unsteadiness on feet: Secondary | ICD-10-CM | POA: Diagnosis not present

## 2023-08-12 DIAGNOSIS — J189 Pneumonia, unspecified organism: Secondary | ICD-10-CM | POA: Diagnosis not present

## 2023-08-12 DIAGNOSIS — R296 Repeated falls: Secondary | ICD-10-CM | POA: Diagnosis not present

## 2023-08-12 DIAGNOSIS — R2689 Other abnormalities of gait and mobility: Secondary | ICD-10-CM | POA: Diagnosis not present

## 2023-08-12 DIAGNOSIS — M6281 Muscle weakness (generalized): Secondary | ICD-10-CM | POA: Diagnosis not present

## 2023-08-12 DIAGNOSIS — J188 Other pneumonia, unspecified organism: Secondary | ICD-10-CM | POA: Diagnosis not present

## 2023-08-12 DIAGNOSIS — R1310 Dysphagia, unspecified: Secondary | ICD-10-CM | POA: Diagnosis not present

## 2023-08-12 DIAGNOSIS — I63422 Cerebral infarction due to embolism of left anterior cerebral artery: Secondary | ICD-10-CM | POA: Diagnosis not present

## 2023-08-13 DIAGNOSIS — R1310 Dysphagia, unspecified: Secondary | ICD-10-CM | POA: Diagnosis not present

## 2023-08-13 DIAGNOSIS — J188 Other pneumonia, unspecified organism: Secondary | ICD-10-CM | POA: Diagnosis not present

## 2023-08-13 DIAGNOSIS — R296 Repeated falls: Secondary | ICD-10-CM | POA: Diagnosis not present

## 2023-08-13 DIAGNOSIS — M6281 Muscle weakness (generalized): Secondary | ICD-10-CM | POA: Diagnosis not present

## 2023-08-13 DIAGNOSIS — R2681 Unsteadiness on feet: Secondary | ICD-10-CM | POA: Diagnosis not present

## 2023-08-13 DIAGNOSIS — J189 Pneumonia, unspecified organism: Secondary | ICD-10-CM | POA: Diagnosis not present

## 2023-08-13 DIAGNOSIS — R2689 Other abnormalities of gait and mobility: Secondary | ICD-10-CM | POA: Diagnosis not present

## 2023-08-13 DIAGNOSIS — I63422 Cerebral infarction due to embolism of left anterior cerebral artery: Secondary | ICD-10-CM | POA: Diagnosis not present

## 2023-08-14 DIAGNOSIS — R2689 Other abnormalities of gait and mobility: Secondary | ICD-10-CM | POA: Diagnosis not present

## 2023-08-14 DIAGNOSIS — M6281 Muscle weakness (generalized): Secondary | ICD-10-CM | POA: Diagnosis not present

## 2023-08-14 DIAGNOSIS — I63422 Cerebral infarction due to embolism of left anterior cerebral artery: Secondary | ICD-10-CM | POA: Diagnosis not present

## 2023-08-14 DIAGNOSIS — R296 Repeated falls: Secondary | ICD-10-CM | POA: Diagnosis not present

## 2023-08-14 DIAGNOSIS — R1310 Dysphagia, unspecified: Secondary | ICD-10-CM | POA: Diagnosis not present

## 2023-08-14 DIAGNOSIS — R2681 Unsteadiness on feet: Secondary | ICD-10-CM | POA: Diagnosis not present

## 2023-08-14 DIAGNOSIS — J188 Other pneumonia, unspecified organism: Secondary | ICD-10-CM | POA: Diagnosis not present

## 2023-08-14 DIAGNOSIS — J189 Pneumonia, unspecified organism: Secondary | ICD-10-CM | POA: Diagnosis not present

## 2023-08-15 ENCOUNTER — Ambulatory Visit
Admission: RE | Admit: 2023-08-15 | Discharge: 2023-08-15 | Disposition: A | Discharge status: Home / Self Care | Payer: Medicare HMO | Source: Ambulatory Visit | Attending: Internal Medicine | Admitting: Internal Medicine

## 2023-08-15 DIAGNOSIS — Z8701 Personal history of pneumonia (recurrent): Secondary | ICD-10-CM | POA: Diagnosis not present

## 2023-08-15 DIAGNOSIS — J189 Pneumonia, unspecified organism: Secondary | ICD-10-CM | POA: Diagnosis not present

## 2023-08-15 DIAGNOSIS — R1310 Dysphagia, unspecified: Secondary | ICD-10-CM | POA: Insufficient documentation

## 2023-08-15 DIAGNOSIS — R058 Other specified cough: Secondary | ICD-10-CM | POA: Insufficient documentation

## 2023-08-15 DIAGNOSIS — I63422 Cerebral infarction due to embolism of left anterior cerebral artery: Secondary | ICD-10-CM | POA: Diagnosis not present

## 2023-08-15 DIAGNOSIS — R2689 Other abnormalities of gait and mobility: Secondary | ICD-10-CM | POA: Diagnosis not present

## 2023-08-15 DIAGNOSIS — J188 Other pneumonia, unspecified organism: Secondary | ICD-10-CM | POA: Diagnosis not present

## 2023-08-15 DIAGNOSIS — R296 Repeated falls: Secondary | ICD-10-CM | POA: Diagnosis not present

## 2023-08-15 DIAGNOSIS — R2681 Unsteadiness on feet: Secondary | ICD-10-CM | POA: Diagnosis not present

## 2023-08-15 DIAGNOSIS — M6281 Muscle weakness (generalized): Secondary | ICD-10-CM | POA: Diagnosis not present

## 2023-08-15 NOTE — Therapy (Signed)
Modified Barium Swallow Study  Patient Details  Name: Ryan Pace MRN: [id number] Date of Birth: 11-08-1928  Today's Date: 08/15/2023  Modified Barium Swallow completed.  Full report located under Chart Review in the Imaging Section.  History of Present Illness 89 y.o. presents for MBSS. CSE 07/29/23, "Pt was unable to complete 3oz water challenge due to coughing mid-way through, indicating increased risk of aspiration. Small individual sips of thin liquid via cup and straw were tolerated without overt s/s aspiration. Puree and solid textures also tolerated well. Pt reported he had undergone a FEES at his facility within the last month. He indicated no penetration or aspiration was seen, and that the SLP recommended small bites and sips with regular texture solids and thin liquids. At this time, recommend continued current diet with adherence to safe swallow precautions reviewed today (small individual bites/sips, slow rate)." CT Chest 12/124, "1. Patchy airspace consolidation and ground-glass opacity within the dependent portions of the right middle lobe and right lower lobes. Similar, although less prominent findings are present within the left lower lobe. Findings are most compatible with multifocal  pneumonia or possibly aspiration..."   Clinical Impression Pt seen for MBSS. Pt presents with a mild oropharyngeal dysphagia. Pt with mildly prolonged mastication of solids likely due to dental status. Pt with before the swallow inconsistently audible aspiration with thin liquids which was reduced to before/during the swallow laryngeal penetration when a chin tuck was utilized. A cued cough with inconsitently effective in clearing penetration. Trace pharyngeal stasis appreciated. Pharyngeal deficits due to decreased base of tongue retration, reduced hyolaryngeal elevation/excursion, mistimed laryngeal vestibule closure, and reduced pharyngeal and laryngeal sensation. Recommend a regular diet with  thin liquids with a chin tuck with all liquid intake (vs nectar-thick liquids with head in neutral position). Factors that may increase risk of adverse event in presence of aspiration Ryan Pace & Ryan Pace 2021): Frail or deconditioned;Limited mobility;Reduced cognitive function  Swallow Evaluation Recommendations Recommendations: PO diet PO Diet Recommendation: Regular;Thin liquids (Level 0) Liquid Administration via: Spoon;Cup;Straw Medication Administration:  (as tolerated) Supervision: Patient able to self-feed;Set-up assistance for safety;Full supervision/cueing for swallowing strategies;Staff to assist with self-feeding Swallowing strategies  : Minimize environmental distractions;Slow rate;Small bites/sips;Chin tuck Postural changes: Position pt fully upright for meals;Stay upright 30-60 min after meals;Out of bed for meals Oral care recommendations: Oral care QID (4x/day)    Ryan Pace, M.S., CCC-SLP Speech-Language Pathologist [facility] [phone] (ASCOM)   Ryan Pace 08/15/2023,1:53 PM

## 2023-08-20 DIAGNOSIS — R2681 Unsteadiness on feet: Secondary | ICD-10-CM | POA: Diagnosis not present

## 2023-08-20 DIAGNOSIS — R1310 Dysphagia, unspecified: Secondary | ICD-10-CM | POA: Diagnosis not present

## 2023-08-20 DIAGNOSIS — R2689 Other abnormalities of gait and mobility: Secondary | ICD-10-CM | POA: Diagnosis not present

## 2023-08-20 DIAGNOSIS — J188 Other pneumonia, unspecified organism: Secondary | ICD-10-CM | POA: Diagnosis not present

## 2023-08-20 DIAGNOSIS — M6281 Muscle weakness (generalized): Secondary | ICD-10-CM | POA: Diagnosis not present

## 2023-08-20 DIAGNOSIS — I63422 Cerebral infarction due to embolism of left anterior cerebral artery: Secondary | ICD-10-CM | POA: Diagnosis not present

## 2023-08-20 DIAGNOSIS — J189 Pneumonia, unspecified organism: Secondary | ICD-10-CM | POA: Diagnosis not present

## 2023-08-20 DIAGNOSIS — R296 Repeated falls: Secondary | ICD-10-CM | POA: Diagnosis not present

## 2023-08-22 DIAGNOSIS — R1310 Dysphagia, unspecified: Secondary | ICD-10-CM | POA: Diagnosis not present

## 2023-08-22 DIAGNOSIS — R296 Repeated falls: Secondary | ICD-10-CM | POA: Diagnosis not present

## 2023-08-22 DIAGNOSIS — R2689 Other abnormalities of gait and mobility: Secondary | ICD-10-CM | POA: Diagnosis not present

## 2023-08-22 DIAGNOSIS — M6281 Muscle weakness (generalized): Secondary | ICD-10-CM | POA: Diagnosis not present

## 2023-08-22 DIAGNOSIS — I63422 Cerebral infarction due to embolism of left anterior cerebral artery: Secondary | ICD-10-CM | POA: Diagnosis not present

## 2023-08-22 DIAGNOSIS — J188 Other pneumonia, unspecified organism: Secondary | ICD-10-CM | POA: Diagnosis not present

## 2023-08-22 DIAGNOSIS — J189 Pneumonia, unspecified organism: Secondary | ICD-10-CM | POA: Diagnosis not present

## 2023-08-22 DIAGNOSIS — R2681 Unsteadiness on feet: Secondary | ICD-10-CM | POA: Diagnosis not present

## 2023-08-23 DIAGNOSIS — R296 Repeated falls: Secondary | ICD-10-CM | POA: Diagnosis not present

## 2023-08-23 DIAGNOSIS — R2681 Unsteadiness on feet: Secondary | ICD-10-CM | POA: Diagnosis not present

## 2023-08-23 DIAGNOSIS — R1310 Dysphagia, unspecified: Secondary | ICD-10-CM | POA: Diagnosis not present

## 2023-08-23 DIAGNOSIS — M6281 Muscle weakness (generalized): Secondary | ICD-10-CM | POA: Diagnosis not present

## 2023-08-23 DIAGNOSIS — J188 Other pneumonia, unspecified organism: Secondary | ICD-10-CM | POA: Diagnosis not present

## 2023-08-23 DIAGNOSIS — R2689 Other abnormalities of gait and mobility: Secondary | ICD-10-CM | POA: Diagnosis not present

## 2023-08-23 DIAGNOSIS — J189 Pneumonia, unspecified organism: Secondary | ICD-10-CM | POA: Diagnosis not present

## 2023-08-23 DIAGNOSIS — I63422 Cerebral infarction due to embolism of left anterior cerebral artery: Secondary | ICD-10-CM | POA: Diagnosis not present

## 2023-08-26 DIAGNOSIS — R2689 Other abnormalities of gait and mobility: Secondary | ICD-10-CM | POA: Diagnosis not present

## 2023-08-26 DIAGNOSIS — R1310 Dysphagia, unspecified: Secondary | ICD-10-CM | POA: Diagnosis not present

## 2023-08-26 DIAGNOSIS — J188 Other pneumonia, unspecified organism: Secondary | ICD-10-CM | POA: Diagnosis not present

## 2023-08-26 DIAGNOSIS — M6281 Muscle weakness (generalized): Secondary | ICD-10-CM | POA: Diagnosis not present

## 2023-08-26 DIAGNOSIS — R296 Repeated falls: Secondary | ICD-10-CM | POA: Diagnosis not present

## 2023-08-26 DIAGNOSIS — I63422 Cerebral infarction due to embolism of left anterior cerebral artery: Secondary | ICD-10-CM | POA: Diagnosis not present

## 2023-08-26 DIAGNOSIS — J189 Pneumonia, unspecified organism: Secondary | ICD-10-CM | POA: Diagnosis not present

## 2023-08-26 DIAGNOSIS — R2681 Unsteadiness on feet: Secondary | ICD-10-CM | POA: Diagnosis not present

## 2023-08-27 DIAGNOSIS — M6281 Muscle weakness (generalized): Secondary | ICD-10-CM | POA: Diagnosis not present

## 2023-08-27 DIAGNOSIS — J188 Other pneumonia, unspecified organism: Secondary | ICD-10-CM | POA: Diagnosis not present

## 2023-08-27 DIAGNOSIS — R1310 Dysphagia, unspecified: Secondary | ICD-10-CM | POA: Diagnosis not present

## 2023-08-27 DIAGNOSIS — R296 Repeated falls: Secondary | ICD-10-CM | POA: Diagnosis not present

## 2023-08-27 DIAGNOSIS — R2689 Other abnormalities of gait and mobility: Secondary | ICD-10-CM | POA: Diagnosis not present

## 2023-08-27 DIAGNOSIS — I63422 Cerebral infarction due to embolism of left anterior cerebral artery: Secondary | ICD-10-CM | POA: Diagnosis not present

## 2023-08-27 DIAGNOSIS — J189 Pneumonia, unspecified organism: Secondary | ICD-10-CM | POA: Diagnosis not present

## 2023-08-27 DIAGNOSIS — R2681 Unsteadiness on feet: Secondary | ICD-10-CM | POA: Diagnosis not present

## 2023-08-28 DIAGNOSIS — I63422 Cerebral infarction due to embolism of left anterior cerebral artery: Secondary | ICD-10-CM | POA: Diagnosis not present

## 2023-08-28 DIAGNOSIS — R2681 Unsteadiness on feet: Secondary | ICD-10-CM | POA: Diagnosis not present

## 2023-08-28 DIAGNOSIS — R1310 Dysphagia, unspecified: Secondary | ICD-10-CM | POA: Diagnosis not present

## 2023-08-28 DIAGNOSIS — R296 Repeated falls: Secondary | ICD-10-CM | POA: Diagnosis not present

## 2023-08-28 DIAGNOSIS — R2689 Other abnormalities of gait and mobility: Secondary | ICD-10-CM | POA: Diagnosis not present

## 2023-08-28 DIAGNOSIS — J188 Other pneumonia, unspecified organism: Secondary | ICD-10-CM | POA: Diagnosis not present

## 2023-08-29 DIAGNOSIS — R296 Repeated falls: Secondary | ICD-10-CM | POA: Diagnosis not present

## 2023-08-29 DIAGNOSIS — J188 Other pneumonia, unspecified organism: Secondary | ICD-10-CM | POA: Diagnosis not present

## 2023-08-29 DIAGNOSIS — R2689 Other abnormalities of gait and mobility: Secondary | ICD-10-CM | POA: Diagnosis not present

## 2023-08-29 DIAGNOSIS — R1310 Dysphagia, unspecified: Secondary | ICD-10-CM | POA: Diagnosis not present

## 2023-08-29 DIAGNOSIS — I63422 Cerebral infarction due to embolism of left anterior cerebral artery: Secondary | ICD-10-CM | POA: Diagnosis not present

## 2023-08-29 DIAGNOSIS — R2681 Unsteadiness on feet: Secondary | ICD-10-CM | POA: Diagnosis not present

## 2023-08-30 DIAGNOSIS — R2689 Other abnormalities of gait and mobility: Secondary | ICD-10-CM | POA: Diagnosis not present

## 2023-08-30 DIAGNOSIS — R2681 Unsteadiness on feet: Secondary | ICD-10-CM | POA: Diagnosis not present

## 2023-08-30 DIAGNOSIS — R296 Repeated falls: Secondary | ICD-10-CM | POA: Diagnosis not present

## 2023-08-30 DIAGNOSIS — R1310 Dysphagia, unspecified: Secondary | ICD-10-CM | POA: Diagnosis not present

## 2023-08-30 DIAGNOSIS — I63422 Cerebral infarction due to embolism of left anterior cerebral artery: Secondary | ICD-10-CM | POA: Diagnosis not present

## 2023-08-30 DIAGNOSIS — J188 Other pneumonia, unspecified organism: Secondary | ICD-10-CM | POA: Diagnosis not present

## 2023-09-02 ENCOUNTER — Encounter: Payer: Medicare HMO | Admitting: Dermatology

## 2023-09-02 DIAGNOSIS — R296 Repeated falls: Secondary | ICD-10-CM | POA: Diagnosis not present

## 2023-09-02 DIAGNOSIS — R1310 Dysphagia, unspecified: Secondary | ICD-10-CM | POA: Diagnosis not present

## 2023-09-02 DIAGNOSIS — R2681 Unsteadiness on feet: Secondary | ICD-10-CM | POA: Diagnosis not present

## 2023-09-02 DIAGNOSIS — J188 Other pneumonia, unspecified organism: Secondary | ICD-10-CM | POA: Diagnosis not present

## 2023-09-02 DIAGNOSIS — I63422 Cerebral infarction due to embolism of left anterior cerebral artery: Secondary | ICD-10-CM | POA: Diagnosis not present

## 2023-09-02 DIAGNOSIS — R2689 Other abnormalities of gait and mobility: Secondary | ICD-10-CM | POA: Diagnosis not present

## 2023-09-10 DIAGNOSIS — I2510 Atherosclerotic heart disease of native coronary artery without angina pectoris: Secondary | ICD-10-CM | POA: Diagnosis not present

## 2023-09-10 DIAGNOSIS — I5032 Chronic diastolic (congestive) heart failure: Secondary | ICD-10-CM | POA: Diagnosis not present

## 2023-09-10 DIAGNOSIS — N1832 Chronic kidney disease, stage 3b: Secondary | ICD-10-CM | POA: Diagnosis not present

## 2023-10-10 DIAGNOSIS — Z0000 Encounter for general adult medical examination without abnormal findings: Secondary | ICD-10-CM | POA: Diagnosis not present

## 2023-10-10 DIAGNOSIS — I480 Paroxysmal atrial fibrillation: Secondary | ICD-10-CM | POA: Diagnosis not present

## 2023-10-10 DIAGNOSIS — N1832 Chronic kidney disease, stage 3b: Secondary | ICD-10-CM | POA: Diagnosis not present

## 2023-10-10 DIAGNOSIS — I5032 Chronic diastolic (congestive) heart failure: Secondary | ICD-10-CM | POA: Diagnosis not present

## 2023-10-21 DIAGNOSIS — R110 Nausea: Secondary | ICD-10-CM | POA: Diagnosis not present

## 2023-10-25 DIAGNOSIS — M7022 Olecranon bursitis, left elbow: Secondary | ICD-10-CM | POA: Diagnosis not present

## 2023-12-10 ENCOUNTER — Encounter: Payer: Self-pay | Admitting: Dermatology

## 2023-12-12 ENCOUNTER — Ambulatory Visit (INDEPENDENT_AMBULATORY_CARE_PROVIDER_SITE_OTHER): Admitting: Dermatology

## 2023-12-12 ENCOUNTER — Encounter: Payer: Self-pay | Admitting: Dermatology

## 2023-12-12 VITALS — BP 124/79 | HR 116 | Temp 97.3°F

## 2023-12-12 DIAGNOSIS — C4441 Basal cell carcinoma of skin of scalp and neck: Secondary | ICD-10-CM | POA: Diagnosis not present

## 2023-12-12 DIAGNOSIS — L579 Skin changes due to chronic exposure to nonionizing radiation, unspecified: Secondary | ICD-10-CM | POA: Diagnosis not present

## 2023-12-12 DIAGNOSIS — C4491 Basal cell carcinoma of skin, unspecified: Secondary | ICD-10-CM | POA: Insufficient documentation

## 2023-12-12 DIAGNOSIS — L814 Other melanin hyperpigmentation: Secondary | ICD-10-CM | POA: Diagnosis not present

## 2023-12-12 MED ORDER — TRAMADOL HCL 50 MG PO TABS: 50.0000 mg | ORAL_TABLET | Freq: Four times a day (QID) | ORAL | 0 refills | Status: DC | PRN

## 2023-12-12 NOTE — Progress Notes (Signed)
 Follow-Up Visit   Subjective  Ryan Pace is a 89 y.o. male who presents for the following: Mohs of a Nodular Basal Cell Carcinoma on the right inferior postauricular scalp, referred by Verneita Griffes at [facility]. Patient is accompanied by his son.  The following portions of the chart were reviewed this encounter and updated as appropriate: medications, allergies, medical history  Review of Systems:  No other skin or systemic complaints except as noted in HPI or Assessment and Plan.  Objective  Well appearing patient in no apparent distress; mood and affect are within normal limits.  A focused examination was performed of the following areas: Right inferior postauricular scalp Relevant physical exam findings are noted in the Assessment and Plan.     Assessment & Plan   BASAL CELL CARCINOMA (BCC), UNSPECIFIED SITE Right inferior Postauricular Scalp Mohs surgery  Consent obtained: written  Anticoagulation: Is the patient taking prescription anticoagulant and/or aspirin prescribed/recommended by a physician? Yes   Was the anticoagulation regimen changed prior to Mohs? No    Procedure Details: Timeout: pre-procedure verification complete Procedure Prep: patient was prepped and draped in usual sterile fashion Prep type: chlorhexidine Biopsy accession number: [id number] Frozen section biopsy performed: No   Specimen debulked: Yes   Pre-Op diagnosis: basal cell carcinoma BCC subtype: nodular MohsAIQ Surgical site (if tumor spans multiple areas, please select predominant area): ear Surgery side: right Surgical site (from skin exam): Right inferior Postauricular Scalp Pre-operative length (cm): 2.5 Pre-operative width (cm): 2.2 Indications for Mohs surgery: anatomic location where tissue conservation is critical Previously treated? No    Micrographic Surgery Details: Post-operative length (cm): 5.5 Post-operative width (cm): 3.2 Number of Mohs stages:  2  Stage 1    Tumor features identified on Mohs section: basal carcinoma    Depth of defect after stage: subcutaneous fat    Perineural invasion: no perineural invasion  Stage 2    Tumor features identified on Mohs section: no tumor identified    Depth of defect after stage: skeletal muscle    Perineural invasion: no perineural invasion  Patient tolerance of procedure: tolerated well, no immediate complications  Reconstruction: Was the defect reconstructed? Yes   Was reconstruction performed by the same Mohs surgeon? Yes   Setting of reconstruction: outpatient office When was reconstruction performed? same day Type of reconstruction: linear Linear reconstruction: complex Length of linear repair (cm): 9  Opioids: Did the patient receive a prescription for opioid/narcotic related to Mohs surgery? Yes   Indications for opioid/narcotics: patient required additional pain relief despite trial of non-opioid analgesia  Antibiotics: Does patient meet AHA guidelines for endocarditis?: No   Does patient meet AHA guidelines for orthopedic prophylaxis?: No   Were antibiotics given on the day of surgery?: No   Did surgery breach mucosa, expose cartilage/bone, involve an area of lymphedema/inflamed/infected tissue? No    Skin repair Complexity:  Complex Final length (cm):  8.5 Informed consent: discussed and consent obtained   Timeout: patient name, date of birth, surgical site, and procedure verified   Procedure prep:  Patient was prepped and draped in usual sterile fashion Prep type:  Chlorhexidine Anesthesia: the lesion was anesthetized in a standard fashion   Anesthetic:  1% lidocaine w/ epinephrine 1-100,000 buffered w/ 8.4% NaHCO3 Reason for type of repair: reduce tension to allow closure and preserve normal anatomy   Undermining: edges undermined   Subcutaneous layers (deep stitches):  Suture size:  4-0 Suture type: Vicryl (polyglactin 910)   Stitches:  Buried vertical  mattress Fine/surface layer approximation (top stitches):  Suture size:  5-0 Suture type: fast-absorbing plain gut   Stitches: simple running   Hemostasis achieved with: suture, pressure and electrodesiccation Outcome: patient tolerated procedure well with no complications   Post-procedure details: sterile dressing applied and wound care instructions given   Dressing type: bandage, petrolatum and pressure dressing    traMADol (ULTRAM) 50 MG tablet Take 1 tablet (50 mg total) by mouth every 6 (six) hours as needed for up to 8 doses.   Return in about 4 weeks (around 01/09/2024) for mohs follow up.   12/12/2023  HISTORY OF PRESENT ILLNESS  Ryan Pace is seen in consultation at the request of Clementina Cutter ([facility]) for biopsy-proven Nodular Basal Cell Carcinoma of the right postauricular region. They note that the area has been present for about 2 years increasing in size with time.  There is no history of previous treatment.  Reports no other new or changing lesions and has no other complaints today.  Medications and allergies: see patient chart.  Review of systems: Reviewed 8 systems and notable for the above skin cancer.  All other systems reviewed are unremarkable/negative, unless noted in the HPI. Past medical history, surgical history, family history, social history were also reviewed and are noted in the chart/questionnaire.    PHYSICAL EXAMINATION  General: Well-appearing, in no acute distress, alert and oriented x 4. Vitals reviewed in chart (if available).   Skin: Exam reveals a 2.5 x 2.2 cm erythematous papule and biopsy scar on the right postauricular region. There are rhytids, telangiectasias, and lentigines, consistent with photodamage.   Biopsy report(s) reviewed, confirming the diagnosis.   ASSESSMENT  1) Nodular Basal Cell Carcinoma of the right postauricular region 2) photodamage 3) solar lentigines   PLAN   1. Due to location, size,  histology, or recurrence and the likelihood of subclinical extension as well as the need to conserve normal surrounding tissue, the patient was deemed acceptable for Mohs micrographic surgery (MMS).  The nature and purpose of the procedure, associated benefits and risks including recurrence and scarring, possible complications such as pain, infection, and bleeding, and alternative methods of treatment if appropriate were discussed with the patient during consent. The lesion location was verified by the patient, by reviewing previous notes, pathology reports, and by photographs as well as angulation measurements if available.  Informed consent was reviewed and signed by the patient, and timeout was performed at 10:30 AM. See op note below.  2. For the photodamage and solar lentigines, sun protection discussed/information given on OTC sunscreens, and we recommend continued regular follow-up with primary dermatologist every 6 months or sooner for any growing, bleeding, or changing lesions. 3. Prognosis and future surveillance discussed. 4. Letter with treatment outcome sent to referring provider. 5. Pain acetaminophen/tramadol 50 mg   MOHS MICROGRAPHIC SURGERY AND RECONSTRUCTION  Initial size:   2.5 x 2.2 cm Surgical defect/wound size: 5.5 x 3.2 cm Anesthesia:    0.33% lidocaine with 1:200,000 epinephrine EBL:    <5 mL Complications:  None Repair type:   Complex SQ suture:   4-0 Vicryl Cutaneous suture:  5-0 Fast Absorbing Gut Final size of the repair: 8.5 cm  Stages: 2  STAGE I: Anesthesia achieved with 0.5% lidocaine with 1:200,000 epinephrine. ChloraPrep applied. 1 section(s) excised using Mohs technique (this includes total peripheral and deep tissue margin excision and evaluation with frozen sections, excised and interpreted by the same physician). The tumor was first debulked  and then excised with an approx. 2 mm margin.  Hemostasis was achieved with electrocautery as needed.  The specimen  was then oriented, subdivided/relaxed, inked, and processed using Mohs technique.    Frozen section analysis revealed a positive margin for presence of islands of basaloid cells with peripheral palisading alongside areas of squamous differentiation with keratinization and intercellular bridges in the deep margin.    STAGE II: An additional 2 mm margin was excised.  Hemostasis was achieved with electrocautery as needed.  The specimen was then oriented, subdivided/relaxed, inked, and processed using Mohs technique. Evaluation of slides by the Mohs surgeon revealed clear tumor margins.  Reconstruction  The surgical wound was then cleaned, prepped, and re-anesthetized as above. Wound edges were undermined extensively along at least one entire edge and at a distance equal to or greater than the width of the defect (see wound defect size above) in order to achieve closure and decrease wound tension and anatomic distortion. Redundant tissue repair including standing cone removal was performed. Hemostasis was achieved with electrocautery. Subcutaneous and epidermal tissues were approximated with the above sutures. The surgical site was then lightly scrubbed with sterile, saline-soaked gauze. The area was then bandaged using Vaseline ointment, non-adherent gauze, gauze pads, and tape to provide an adequate pressure dressing. The patient tolerated the procedure well, was given detailed written and verbal wound care instructions, and was discharged in good condition.   The patient will follow-up: 4 weeks.    Documentation: I have reviewed the above documentation for accuracy and completeness, and I agree with the above.  Deneise Finlay, MD

## 2023-12-12 NOTE — Patient Instructions (Signed)

## 2023-12-23 DIAGNOSIS — B351 Tinea unguium: Secondary | ICD-10-CM | POA: Diagnosis not present

## 2023-12-23 DIAGNOSIS — I7091 Generalized atherosclerosis: Secondary | ICD-10-CM | POA: Diagnosis not present

## 2023-12-26 DIAGNOSIS — I480 Paroxysmal atrial fibrillation: Secondary | ICD-10-CM | POA: Diagnosis not present

## 2023-12-26 DIAGNOSIS — M545 Low back pain, unspecified: Secondary | ICD-10-CM | POA: Diagnosis not present

## 2023-12-26 DIAGNOSIS — I5032 Chronic diastolic (congestive) heart failure: Secondary | ICD-10-CM | POA: Diagnosis not present

## 2023-12-27 DIAGNOSIS — N390 Urinary tract infection, site not specified: Secondary | ICD-10-CM | POA: Diagnosis not present

## 2023-12-30 ENCOUNTER — Telehealth: Payer: Self-pay | Admitting: Dermatology

## 2023-12-30 DIAGNOSIS — N189 Chronic kidney disease, unspecified: Secondary | ICD-10-CM | POA: Diagnosis not present

## 2023-12-30 DIAGNOSIS — N179 Acute kidney failure, unspecified: Secondary | ICD-10-CM | POA: Diagnosis not present

## 2023-12-30 DIAGNOSIS — R8290 Unspecified abnormal findings in urine: Secondary | ICD-10-CM | POA: Diagnosis not present

## 2023-12-30 DIAGNOSIS — N1832 Chronic kidney disease, stage 3b: Secondary | ICD-10-CM | POA: Diagnosis not present

## 2023-12-30 NOTE — Telephone Encounter (Signed)
 Patient son called in to cancel appointment. He stated that due to him living in [place] he is not able to bring the patient in for the wound check appointment. He stated that his father (patient) gets care at the facility he is currently residing and he feels that if there was a problem/issue that facility or his PCP would have notified him.

## 2023-12-31 ENCOUNTER — Encounter: Payer: Self-pay | Admitting: Dermatology

## 2023-12-31 DIAGNOSIS — M545 Low back pain, unspecified: Secondary | ICD-10-CM | POA: Diagnosis not present

## 2024-01-06 ENCOUNTER — Ambulatory Visit: Admitting: Dermatology

## 2024-01-29 ENCOUNTER — Encounter: Payer: Self-pay | Admitting: Dermatology

## 2024-02-03 DIAGNOSIS — I5032 Chronic diastolic (congestive) heart failure: Secondary | ICD-10-CM | POA: Diagnosis not present

## 2024-02-03 DIAGNOSIS — G4730 Sleep apnea, unspecified: Secondary | ICD-10-CM | POA: Diagnosis not present

## 2024-02-03 DIAGNOSIS — I480 Paroxysmal atrial fibrillation: Secondary | ICD-10-CM | POA: Diagnosis not present

## 2024-02-03 DIAGNOSIS — G4700 Insomnia, unspecified: Secondary | ICD-10-CM | POA: Diagnosis not present

## 2024-02-03 DIAGNOSIS — M549 Dorsalgia, unspecified: Secondary | ICD-10-CM | POA: Diagnosis not present

## 2024-02-04 DIAGNOSIS — Z9181 History of falling: Secondary | ICD-10-CM | POA: Diagnosis not present

## 2024-02-04 DIAGNOSIS — I63422 Cerebral infarction due to embolism of left anterior cerebral artery: Secondary | ICD-10-CM | POA: Diagnosis not present

## 2024-02-04 DIAGNOSIS — M6281 Muscle weakness (generalized): Secondary | ICD-10-CM | POA: Diagnosis not present

## 2024-02-04 DIAGNOSIS — M79604 Pain in right leg: Secondary | ICD-10-CM | POA: Diagnosis not present

## 2024-02-04 DIAGNOSIS — R2681 Unsteadiness on feet: Secondary | ICD-10-CM | POA: Diagnosis not present

## 2024-02-11 DIAGNOSIS — I63422 Cerebral infarction due to embolism of left anterior cerebral artery: Secondary | ICD-10-CM | POA: Diagnosis not present

## 2024-02-11 DIAGNOSIS — M6281 Muscle weakness (generalized): Secondary | ICD-10-CM | POA: Diagnosis not present

## 2024-02-11 DIAGNOSIS — M79604 Pain in right leg: Secondary | ICD-10-CM | POA: Diagnosis not present

## 2024-02-11 DIAGNOSIS — Z9181 History of falling: Secondary | ICD-10-CM | POA: Diagnosis not present

## 2024-02-11 DIAGNOSIS — R2681 Unsteadiness on feet: Secondary | ICD-10-CM | POA: Diagnosis not present

## 2024-02-13 DIAGNOSIS — M6281 Muscle weakness (generalized): Secondary | ICD-10-CM | POA: Diagnosis not present

## 2024-02-13 DIAGNOSIS — I63422 Cerebral infarction due to embolism of left anterior cerebral artery: Secondary | ICD-10-CM | POA: Diagnosis not present

## 2024-02-13 DIAGNOSIS — M79604 Pain in right leg: Secondary | ICD-10-CM | POA: Diagnosis not present

## 2024-02-13 DIAGNOSIS — Z9181 History of falling: Secondary | ICD-10-CM | POA: Diagnosis not present

## 2024-02-13 DIAGNOSIS — R2681 Unsteadiness on feet: Secondary | ICD-10-CM | POA: Diagnosis not present

## 2024-02-19 DIAGNOSIS — I63422 Cerebral infarction due to embolism of left anterior cerebral artery: Secondary | ICD-10-CM | POA: Diagnosis not present

## 2024-02-19 DIAGNOSIS — M6281 Muscle weakness (generalized): Secondary | ICD-10-CM | POA: Diagnosis not present

## 2024-02-19 DIAGNOSIS — Z9181 History of falling: Secondary | ICD-10-CM | POA: Diagnosis not present

## 2024-02-19 DIAGNOSIS — M79604 Pain in right leg: Secondary | ICD-10-CM | POA: Diagnosis not present

## 2024-02-19 DIAGNOSIS — R2681 Unsteadiness on feet: Secondary | ICD-10-CM | POA: Diagnosis not present

## 2024-02-21 DIAGNOSIS — M6281 Muscle weakness (generalized): Secondary | ICD-10-CM | POA: Diagnosis not present

## 2024-02-21 DIAGNOSIS — I63422 Cerebral infarction due to embolism of left anterior cerebral artery: Secondary | ICD-10-CM | POA: Diagnosis not present

## 2024-02-21 DIAGNOSIS — M79604 Pain in right leg: Secondary | ICD-10-CM | POA: Diagnosis not present

## 2024-02-21 DIAGNOSIS — Z9181 History of falling: Secondary | ICD-10-CM | POA: Diagnosis not present

## 2024-02-21 DIAGNOSIS — R2681 Unsteadiness on feet: Secondary | ICD-10-CM | POA: Diagnosis not present

## 2024-02-24 DIAGNOSIS — B351 Tinea unguium: Secondary | ICD-10-CM | POA: Diagnosis not present

## 2024-02-24 DIAGNOSIS — I7091 Generalized atherosclerosis: Secondary | ICD-10-CM | POA: Diagnosis not present

## 2024-02-25 DIAGNOSIS — M79604 Pain in right leg: Secondary | ICD-10-CM | POA: Diagnosis not present

## 2024-02-25 DIAGNOSIS — I63422 Cerebral infarction due to embolism of left anterior cerebral artery: Secondary | ICD-10-CM | POA: Diagnosis not present

## 2024-02-27 DIAGNOSIS — M79604 Pain in right leg: Secondary | ICD-10-CM | POA: Diagnosis not present

## 2024-02-27 DIAGNOSIS — I63422 Cerebral infarction due to embolism of left anterior cerebral artery: Secondary | ICD-10-CM | POA: Diagnosis not present

## 2024-03-02 DIAGNOSIS — I63422 Cerebral infarction due to embolism of left anterior cerebral artery: Secondary | ICD-10-CM | POA: Diagnosis not present

## 2024-03-02 DIAGNOSIS — M79604 Pain in right leg: Secondary | ICD-10-CM | POA: Diagnosis not present

## 2024-03-06 DIAGNOSIS — I480 Paroxysmal atrial fibrillation: Secondary | ICD-10-CM | POA: Diagnosis not present

## 2024-03-06 DIAGNOSIS — I63422 Cerebral infarction due to embolism of left anterior cerebral artery: Secondary | ICD-10-CM | POA: Diagnosis not present

## 2024-03-06 DIAGNOSIS — G8929 Other chronic pain: Secondary | ICD-10-CM | POA: Diagnosis not present

## 2024-03-06 DIAGNOSIS — M545 Low back pain, unspecified: Secondary | ICD-10-CM | POA: Diagnosis not present

## 2024-03-06 DIAGNOSIS — M79604 Pain in right leg: Secondary | ICD-10-CM | POA: Diagnosis not present

## 2024-03-06 DIAGNOSIS — I5032 Chronic diastolic (congestive) heart failure: Secondary | ICD-10-CM | POA: Diagnosis not present

## 2024-03-10 DIAGNOSIS — M79604 Pain in right leg: Secondary | ICD-10-CM | POA: Diagnosis not present

## 2024-03-10 DIAGNOSIS — I63422 Cerebral infarction due to embolism of left anterior cerebral artery: Secondary | ICD-10-CM | POA: Diagnosis not present

## 2024-03-13 DIAGNOSIS — I63422 Cerebral infarction due to embolism of left anterior cerebral artery: Secondary | ICD-10-CM | POA: Diagnosis not present

## 2024-03-13 DIAGNOSIS — M79604 Pain in right leg: Secondary | ICD-10-CM | POA: Diagnosis not present

## 2024-03-15 DIAGNOSIS — M79604 Pain in right leg: Secondary | ICD-10-CM | POA: Diagnosis not present

## 2024-03-15 DIAGNOSIS — I63422 Cerebral infarction due to embolism of left anterior cerebral artery: Secondary | ICD-10-CM | POA: Diagnosis not present

## 2024-03-17 DIAGNOSIS — I63422 Cerebral infarction due to embolism of left anterior cerebral artery: Secondary | ICD-10-CM | POA: Diagnosis not present

## 2024-03-17 DIAGNOSIS — M79604 Pain in right leg: Secondary | ICD-10-CM | POA: Diagnosis not present

## 2024-03-19 DIAGNOSIS — M79604 Pain in right leg: Secondary | ICD-10-CM | POA: Diagnosis not present

## 2024-03-19 DIAGNOSIS — I63422 Cerebral infarction due to embolism of left anterior cerebral artery: Secondary | ICD-10-CM | POA: Diagnosis not present

## 2024-03-23 DIAGNOSIS — I63422 Cerebral infarction due to embolism of left anterior cerebral artery: Secondary | ICD-10-CM | POA: Diagnosis not present

## 2024-03-23 DIAGNOSIS — M79604 Pain in right leg: Secondary | ICD-10-CM | POA: Diagnosis not present

## 2024-03-24 DIAGNOSIS — M79604 Pain in right leg: Secondary | ICD-10-CM | POA: Diagnosis not present

## 2024-03-24 DIAGNOSIS — I63422 Cerebral infarction due to embolism of left anterior cerebral artery: Secondary | ICD-10-CM | POA: Diagnosis not present

## 2024-03-25 DIAGNOSIS — M79604 Pain in right leg: Secondary | ICD-10-CM | POA: Diagnosis not present

## 2024-03-25 DIAGNOSIS — I63422 Cerebral infarction due to embolism of left anterior cerebral artery: Secondary | ICD-10-CM | POA: Diagnosis not present

## 2024-03-27 DIAGNOSIS — I63422 Cerebral infarction due to embolism of left anterior cerebral artery: Secondary | ICD-10-CM | POA: Diagnosis not present

## 2024-03-27 DIAGNOSIS — R2681 Unsteadiness on feet: Secondary | ICD-10-CM | POA: Diagnosis not present

## 2024-03-27 DIAGNOSIS — M6281 Muscle weakness (generalized): Secondary | ICD-10-CM | POA: Diagnosis not present

## 2024-03-27 DIAGNOSIS — Z9181 History of falling: Secondary | ICD-10-CM | POA: Diagnosis not present

## 2024-03-27 DIAGNOSIS — M79604 Pain in right leg: Secondary | ICD-10-CM | POA: Diagnosis not present

## 2024-04-02 DIAGNOSIS — Z9181 History of falling: Secondary | ICD-10-CM | POA: Diagnosis not present

## 2024-04-02 DIAGNOSIS — M6281 Muscle weakness (generalized): Secondary | ICD-10-CM | POA: Diagnosis not present

## 2024-04-02 DIAGNOSIS — I63422 Cerebral infarction due to embolism of left anterior cerebral artery: Secondary | ICD-10-CM | POA: Diagnosis not present

## 2024-04-02 DIAGNOSIS — M79604 Pain in right leg: Secondary | ICD-10-CM | POA: Diagnosis not present

## 2024-04-02 DIAGNOSIS — R2681 Unsteadiness on feet: Secondary | ICD-10-CM | POA: Diagnosis not present

## 2024-04-03 DIAGNOSIS — R2681 Unsteadiness on feet: Secondary | ICD-10-CM | POA: Diagnosis not present

## 2024-04-03 DIAGNOSIS — Z9181 History of falling: Secondary | ICD-10-CM | POA: Diagnosis not present

## 2024-04-03 DIAGNOSIS — M79604 Pain in right leg: Secondary | ICD-10-CM | POA: Diagnosis not present

## 2024-04-03 DIAGNOSIS — M6281 Muscle weakness (generalized): Secondary | ICD-10-CM | POA: Diagnosis not present

## 2024-04-03 DIAGNOSIS — I63422 Cerebral infarction due to embolism of left anterior cerebral artery: Secondary | ICD-10-CM | POA: Diagnosis not present

## 2024-04-08 DIAGNOSIS — R2681 Unsteadiness on feet: Secondary | ICD-10-CM | POA: Diagnosis not present

## 2024-04-08 DIAGNOSIS — I63422 Cerebral infarction due to embolism of left anterior cerebral artery: Secondary | ICD-10-CM | POA: Diagnosis not present

## 2024-04-08 DIAGNOSIS — M6281 Muscle weakness (generalized): Secondary | ICD-10-CM | POA: Diagnosis not present

## 2024-04-08 DIAGNOSIS — Z9181 History of falling: Secondary | ICD-10-CM | POA: Diagnosis not present

## 2024-04-08 DIAGNOSIS — M79604 Pain in right leg: Secondary | ICD-10-CM | POA: Diagnosis not present

## 2024-04-10 DIAGNOSIS — M79604 Pain in right leg: Secondary | ICD-10-CM | POA: Diagnosis not present

## 2024-04-10 DIAGNOSIS — R2681 Unsteadiness on feet: Secondary | ICD-10-CM | POA: Diagnosis not present

## 2024-04-10 DIAGNOSIS — Z9181 History of falling: Secondary | ICD-10-CM | POA: Diagnosis not present

## 2024-04-10 DIAGNOSIS — M6281 Muscle weakness (generalized): Secondary | ICD-10-CM | POA: Diagnosis not present

## 2024-04-10 DIAGNOSIS — I63422 Cerebral infarction due to embolism of left anterior cerebral artery: Secondary | ICD-10-CM | POA: Diagnosis not present

## 2024-04-14 DIAGNOSIS — Z9181 History of falling: Secondary | ICD-10-CM | POA: Diagnosis not present

## 2024-04-14 DIAGNOSIS — I63422 Cerebral infarction due to embolism of left anterior cerebral artery: Secondary | ICD-10-CM | POA: Diagnosis not present

## 2024-04-14 DIAGNOSIS — M6281 Muscle weakness (generalized): Secondary | ICD-10-CM | POA: Diagnosis not present

## 2024-04-14 DIAGNOSIS — M79604 Pain in right leg: Secondary | ICD-10-CM | POA: Diagnosis not present

## 2024-04-14 DIAGNOSIS — R2681 Unsteadiness on feet: Secondary | ICD-10-CM | POA: Diagnosis not present

## 2024-04-16 DIAGNOSIS — M6281 Muscle weakness (generalized): Secondary | ICD-10-CM | POA: Diagnosis not present

## 2024-04-16 DIAGNOSIS — R2681 Unsteadiness on feet: Secondary | ICD-10-CM | POA: Diagnosis not present

## 2024-04-16 DIAGNOSIS — I63422 Cerebral infarction due to embolism of left anterior cerebral artery: Secondary | ICD-10-CM | POA: Diagnosis not present

## 2024-04-16 DIAGNOSIS — M79604 Pain in right leg: Secondary | ICD-10-CM | POA: Diagnosis not present

## 2024-04-16 DIAGNOSIS — Z9181 History of falling: Secondary | ICD-10-CM | POA: Diagnosis not present

## 2024-04-21 DIAGNOSIS — Z9181 History of falling: Secondary | ICD-10-CM | POA: Diagnosis not present

## 2024-04-21 DIAGNOSIS — M79604 Pain in right leg: Secondary | ICD-10-CM | POA: Diagnosis not present

## 2024-04-21 DIAGNOSIS — M6281 Muscle weakness (generalized): Secondary | ICD-10-CM | POA: Diagnosis not present

## 2024-04-21 DIAGNOSIS — I63422 Cerebral infarction due to embolism of left anterior cerebral artery: Secondary | ICD-10-CM | POA: Diagnosis not present

## 2024-04-21 DIAGNOSIS — R2681 Unsteadiness on feet: Secondary | ICD-10-CM | POA: Diagnosis not present

## 2024-04-23 DIAGNOSIS — M6281 Muscle weakness (generalized): Secondary | ICD-10-CM | POA: Diagnosis not present

## 2024-04-23 DIAGNOSIS — I63422 Cerebral infarction due to embolism of left anterior cerebral artery: Secondary | ICD-10-CM | POA: Diagnosis not present

## 2024-04-23 DIAGNOSIS — M79604 Pain in right leg: Secondary | ICD-10-CM | POA: Diagnosis not present

## 2024-04-23 DIAGNOSIS — Z9181 History of falling: Secondary | ICD-10-CM | POA: Diagnosis not present

## 2024-04-23 DIAGNOSIS — R2681 Unsteadiness on feet: Secondary | ICD-10-CM | POA: Diagnosis not present

## 2024-04-29 DIAGNOSIS — M79604 Pain in right leg: Secondary | ICD-10-CM | POA: Diagnosis not present

## 2024-04-29 DIAGNOSIS — R2681 Unsteadiness on feet: Secondary | ICD-10-CM | POA: Diagnosis not present

## 2024-04-29 DIAGNOSIS — Z9181 History of falling: Secondary | ICD-10-CM | POA: Diagnosis not present

## 2024-04-29 DIAGNOSIS — M6281 Muscle weakness (generalized): Secondary | ICD-10-CM | POA: Diagnosis not present

## 2024-04-29 DIAGNOSIS — I63422 Cerebral infarction due to embolism of left anterior cerebral artery: Secondary | ICD-10-CM | POA: Diagnosis not present

## 2024-04-30 DIAGNOSIS — Z9181 History of falling: Secondary | ICD-10-CM | POA: Diagnosis not present

## 2024-04-30 DIAGNOSIS — M6281 Muscle weakness (generalized): Secondary | ICD-10-CM | POA: Diagnosis not present

## 2024-04-30 DIAGNOSIS — R2681 Unsteadiness on feet: Secondary | ICD-10-CM | POA: Diagnosis not present

## 2024-04-30 DIAGNOSIS — I63422 Cerebral infarction due to embolism of left anterior cerebral artery: Secondary | ICD-10-CM | POA: Diagnosis not present

## 2024-04-30 DIAGNOSIS — M79604 Pain in right leg: Secondary | ICD-10-CM | POA: Diagnosis not present

## 2024-05-05 DIAGNOSIS — R2681 Unsteadiness on feet: Secondary | ICD-10-CM | POA: Diagnosis not present

## 2024-05-05 DIAGNOSIS — Z9181 History of falling: Secondary | ICD-10-CM | POA: Diagnosis not present

## 2024-05-05 DIAGNOSIS — M6281 Muscle weakness (generalized): Secondary | ICD-10-CM | POA: Diagnosis not present

## 2024-05-05 DIAGNOSIS — M79604 Pain in right leg: Secondary | ICD-10-CM | POA: Diagnosis not present

## 2024-05-07 DIAGNOSIS — M79604 Pain in right leg: Secondary | ICD-10-CM | POA: Diagnosis not present

## 2024-05-07 DIAGNOSIS — Z9181 History of falling: Secondary | ICD-10-CM | POA: Diagnosis not present

## 2024-05-07 DIAGNOSIS — M6281 Muscle weakness (generalized): Secondary | ICD-10-CM | POA: Diagnosis not present

## 2024-05-07 DIAGNOSIS — I63422 Cerebral infarction due to embolism of left anterior cerebral artery: Secondary | ICD-10-CM | POA: Diagnosis not present

## 2024-05-07 DIAGNOSIS — R2681 Unsteadiness on feet: Secondary | ICD-10-CM | POA: Diagnosis not present

## 2024-05-13 DIAGNOSIS — R2681 Unsteadiness on feet: Secondary | ICD-10-CM | POA: Diagnosis not present

## 2024-05-13 DIAGNOSIS — M79604 Pain in right leg: Secondary | ICD-10-CM | POA: Diagnosis not present

## 2024-05-13 DIAGNOSIS — Z9181 History of falling: Secondary | ICD-10-CM | POA: Diagnosis not present

## 2024-05-13 DIAGNOSIS — M6281 Muscle weakness (generalized): Secondary | ICD-10-CM | POA: Diagnosis not present

## 2024-05-15 DIAGNOSIS — R2681 Unsteadiness on feet: Secondary | ICD-10-CM | POA: Diagnosis not present

## 2024-05-15 DIAGNOSIS — Z9181 History of falling: Secondary | ICD-10-CM | POA: Diagnosis not present

## 2024-05-15 DIAGNOSIS — M79604 Pain in right leg: Secondary | ICD-10-CM | POA: Diagnosis not present

## 2024-05-19 DIAGNOSIS — M6281 Muscle weakness (generalized): Secondary | ICD-10-CM | POA: Diagnosis not present

## 2024-05-19 DIAGNOSIS — M79604 Pain in right leg: Secondary | ICD-10-CM | POA: Diagnosis not present

## 2024-05-19 DIAGNOSIS — R2681 Unsteadiness on feet: Secondary | ICD-10-CM | POA: Diagnosis not present

## 2024-05-19 DIAGNOSIS — Z9181 History of falling: Secondary | ICD-10-CM | POA: Diagnosis not present

## 2024-05-22 DIAGNOSIS — M6281 Muscle weakness (generalized): Secondary | ICD-10-CM | POA: Diagnosis not present

## 2024-05-22 DIAGNOSIS — M79604 Pain in right leg: Secondary | ICD-10-CM | POA: Diagnosis not present

## 2024-05-22 DIAGNOSIS — R2681 Unsteadiness on feet: Secondary | ICD-10-CM | POA: Diagnosis not present

## 2024-05-22 DIAGNOSIS — Z9181 History of falling: Secondary | ICD-10-CM | POA: Diagnosis not present

## 2024-05-25 DIAGNOSIS — I480 Paroxysmal atrial fibrillation: Secondary | ICD-10-CM | POA: Diagnosis not present

## 2024-05-25 DIAGNOSIS — I5032 Chronic diastolic (congestive) heart failure: Secondary | ICD-10-CM | POA: Diagnosis not present

## 2024-05-25 DIAGNOSIS — N1832 Chronic kidney disease, stage 3b: Secondary | ICD-10-CM | POA: Diagnosis not present

## 2024-05-25 DIAGNOSIS — K5900 Constipation, unspecified: Secondary | ICD-10-CM | POA: Diagnosis not present

## 2024-05-25 DIAGNOSIS — I2510 Atherosclerotic heart disease of native coronary artery without angina pectoris: Secondary | ICD-10-CM | POA: Diagnosis not present

## 2024-05-25 DIAGNOSIS — F325 Major depressive disorder, single episode, in full remission: Secondary | ICD-10-CM | POA: Diagnosis not present

## 2024-05-27 DIAGNOSIS — I63422 Cerebral infarction due to embolism of left anterior cerebral artery: Secondary | ICD-10-CM | POA: Diagnosis not present

## 2024-05-27 DIAGNOSIS — M79604 Pain in right leg: Secondary | ICD-10-CM | POA: Diagnosis not present

## 2024-05-29 DIAGNOSIS — M79604 Pain in right leg: Secondary | ICD-10-CM | POA: Diagnosis not present

## 2024-05-29 DIAGNOSIS — I63422 Cerebral infarction due to embolism of left anterior cerebral artery: Secondary | ICD-10-CM | POA: Diagnosis not present

## 2024-06-02 DIAGNOSIS — I5032 Chronic diastolic (congestive) heart failure: Secondary | ICD-10-CM | POA: Diagnosis not present

## 2024-06-02 DIAGNOSIS — I63422 Cerebral infarction due to embolism of left anterior cerebral artery: Secondary | ICD-10-CM | POA: Diagnosis not present

## 2024-06-02 DIAGNOSIS — M79604 Pain in right leg: Secondary | ICD-10-CM | POA: Diagnosis not present

## 2024-06-02 DIAGNOSIS — R059 Cough, unspecified: Secondary | ICD-10-CM | POA: Diagnosis not present

## 2024-06-02 DIAGNOSIS — K5900 Constipation, unspecified: Secondary | ICD-10-CM | POA: Diagnosis not present

## 2024-06-10 DIAGNOSIS — M79604 Pain in right leg: Secondary | ICD-10-CM | POA: Diagnosis not present

## 2024-06-10 DIAGNOSIS — I63422 Cerebral infarction due to embolism of left anterior cerebral artery: Secondary | ICD-10-CM | POA: Diagnosis not present

## 2024-06-11 DIAGNOSIS — M79604 Pain in right leg: Secondary | ICD-10-CM | POA: Diagnosis not present

## 2024-06-11 DIAGNOSIS — I63422 Cerebral infarction due to embolism of left anterior cerebral artery: Secondary | ICD-10-CM | POA: Diagnosis not present

## 2024-06-16 DIAGNOSIS — M79604 Pain in right leg: Secondary | ICD-10-CM | POA: Diagnosis not present

## 2024-06-16 DIAGNOSIS — I63422 Cerebral infarction due to embolism of left anterior cerebral artery: Secondary | ICD-10-CM | POA: Diagnosis not present

## 2024-06-17 DIAGNOSIS — M79604 Pain in right leg: Secondary | ICD-10-CM | POA: Diagnosis not present

## 2024-06-17 DIAGNOSIS — I63422 Cerebral infarction due to embolism of left anterior cerebral artery: Secondary | ICD-10-CM | POA: Diagnosis not present

## 2024-06-22 DIAGNOSIS — M79604 Pain in right leg: Secondary | ICD-10-CM | POA: Diagnosis not present

## 2024-06-22 DIAGNOSIS — I63422 Cerebral infarction due to embolism of left anterior cerebral artery: Secondary | ICD-10-CM | POA: Diagnosis not present

## 2024-06-24 DIAGNOSIS — I63422 Cerebral infarction due to embolism of left anterior cerebral artery: Secondary | ICD-10-CM | POA: Diagnosis not present

## 2024-06-24 DIAGNOSIS — M79604 Pain in right leg: Secondary | ICD-10-CM | POA: Diagnosis not present

## 2024-06-26 DIAGNOSIS — J441 Chronic obstructive pulmonary disease with (acute) exacerbation: Secondary | ICD-10-CM | POA: Diagnosis not present

## 2024-06-26 DIAGNOSIS — J449 Chronic obstructive pulmonary disease, unspecified: Secondary | ICD-10-CM | POA: Diagnosis not present

## 2024-07-01 DIAGNOSIS — I63422 Cerebral infarction due to embolism of left anterior cerebral artery: Secondary | ICD-10-CM | POA: Diagnosis not present

## 2024-07-01 DIAGNOSIS — R2681 Unsteadiness on feet: Secondary | ICD-10-CM | POA: Diagnosis not present

## 2024-07-01 DIAGNOSIS — M79604 Pain in right leg: Secondary | ICD-10-CM | POA: Diagnosis not present

## 2024-07-01 DIAGNOSIS — M6281 Muscle weakness (generalized): Secondary | ICD-10-CM | POA: Diagnosis not present

## 2024-07-01 DIAGNOSIS — Z9181 History of falling: Secondary | ICD-10-CM | POA: Diagnosis not present

## 2024-07-02 DIAGNOSIS — L309 Dermatitis, unspecified: Secondary | ICD-10-CM | POA: Diagnosis not present

## 2024-07-02 DIAGNOSIS — I63422 Cerebral infarction due to embolism of left anterior cerebral artery: Secondary | ICD-10-CM | POA: Diagnosis not present

## 2024-07-02 DIAGNOSIS — M79604 Pain in right leg: Secondary | ICD-10-CM | POA: Diagnosis not present

## 2024-07-02 DIAGNOSIS — J441 Chronic obstructive pulmonary disease with (acute) exacerbation: Secondary | ICD-10-CM | POA: Diagnosis not present

## 2024-07-02 DIAGNOSIS — M6281 Muscle weakness (generalized): Secondary | ICD-10-CM | POA: Diagnosis not present

## 2024-07-02 DIAGNOSIS — Z9181 History of falling: Secondary | ICD-10-CM | POA: Diagnosis not present

## 2024-07-02 DIAGNOSIS — R2681 Unsteadiness on feet: Secondary | ICD-10-CM | POA: Diagnosis not present

## 2024-07-06 DIAGNOSIS — Z9181 History of falling: Secondary | ICD-10-CM | POA: Diagnosis not present

## 2024-07-06 DIAGNOSIS — M6281 Muscle weakness (generalized): Secondary | ICD-10-CM | POA: Diagnosis not present

## 2024-07-06 DIAGNOSIS — I63422 Cerebral infarction due to embolism of left anterior cerebral artery: Secondary | ICD-10-CM | POA: Diagnosis not present

## 2024-07-06 DIAGNOSIS — J441 Chronic obstructive pulmonary disease with (acute) exacerbation: Secondary | ICD-10-CM | POA: Diagnosis not present

## 2024-07-06 DIAGNOSIS — M79604 Pain in right leg: Secondary | ICD-10-CM | POA: Diagnosis not present

## 2024-07-06 DIAGNOSIS — R2681 Unsteadiness on feet: Secondary | ICD-10-CM | POA: Diagnosis not present

## 2024-07-06 DIAGNOSIS — B356 Tinea cruris: Secondary | ICD-10-CM | POA: Diagnosis not present

## 2024-07-09 DIAGNOSIS — M79604 Pain in right leg: Secondary | ICD-10-CM | POA: Diagnosis not present

## 2024-07-09 DIAGNOSIS — M6281 Muscle weakness (generalized): Secondary | ICD-10-CM | POA: Diagnosis not present

## 2024-07-09 DIAGNOSIS — I63422 Cerebral infarction due to embolism of left anterior cerebral artery: Secondary | ICD-10-CM | POA: Diagnosis not present

## 2024-07-09 DIAGNOSIS — R2681 Unsteadiness on feet: Secondary | ICD-10-CM | POA: Diagnosis not present

## 2024-07-09 DIAGNOSIS — Z9181 History of falling: Secondary | ICD-10-CM | POA: Diagnosis not present

## 2024-07-14 DIAGNOSIS — M6281 Muscle weakness (generalized): Secondary | ICD-10-CM | POA: Diagnosis not present

## 2024-07-14 DIAGNOSIS — M79604 Pain in right leg: Secondary | ICD-10-CM | POA: Diagnosis not present

## 2024-07-14 DIAGNOSIS — R2681 Unsteadiness on feet: Secondary | ICD-10-CM | POA: Diagnosis not present

## 2024-07-14 DIAGNOSIS — Z9181 History of falling: Secondary | ICD-10-CM | POA: Diagnosis not present

## 2024-07-15 DIAGNOSIS — R059 Cough, unspecified: Secondary | ICD-10-CM | POA: Diagnosis not present

## 2024-07-15 DIAGNOSIS — R062 Wheezing: Secondary | ICD-10-CM | POA: Diagnosis not present

## 2024-07-15 DIAGNOSIS — J441 Chronic obstructive pulmonary disease with (acute) exacerbation: Secondary | ICD-10-CM | POA: Diagnosis not present

## 2024-07-16 ENCOUNTER — Emergency Department

## 2024-07-16 ENCOUNTER — Other Ambulatory Visit: Payer: Self-pay

## 2024-07-16 ENCOUNTER — Inpatient Hospital Stay

## 2024-07-16 ENCOUNTER — Inpatient Hospital Stay
Admission: EM | Admit: 2024-07-16 | Discharge: 2024-07-22 | DRG: 177 | Disposition: A | Discharge status: Hospice | Source: Skilled Nursing Facility | Attending: Internal Medicine | Admitting: Internal Medicine

## 2024-07-16 DIAGNOSIS — Z87891 Personal history of nicotine dependence: Secondary | ICD-10-CM | POA: Diagnosis not present

## 2024-07-16 DIAGNOSIS — J90 Pleural effusion, not elsewhere classified: Secondary | ICD-10-CM | POA: Diagnosis not present

## 2024-07-16 DIAGNOSIS — C4491 Basal cell carcinoma of skin, unspecified: Secondary | ICD-10-CM | POA: Diagnosis not present

## 2024-07-16 DIAGNOSIS — Z7901 Long term (current) use of anticoagulants: Secondary | ICD-10-CM

## 2024-07-16 DIAGNOSIS — Z880 Allergy status to penicillin: Secondary | ICD-10-CM

## 2024-07-16 DIAGNOSIS — Z85828 Personal history of other malignant neoplasm of skin: Secondary | ICD-10-CM

## 2024-07-16 DIAGNOSIS — Z9842 Cataract extraction status, left eye: Secondary | ICD-10-CM

## 2024-07-16 DIAGNOSIS — G4733 Obstructive sleep apnea (adult) (pediatric): Secondary | ICD-10-CM | POA: Diagnosis present

## 2024-07-16 DIAGNOSIS — R55 Syncope and collapse: Secondary | ICD-10-CM | POA: Diagnosis present

## 2024-07-16 DIAGNOSIS — Z515 Encounter for palliative care: Secondary | ICD-10-CM

## 2024-07-16 DIAGNOSIS — N179 Acute kidney failure, unspecified: Secondary | ICD-10-CM | POA: Diagnosis present

## 2024-07-16 DIAGNOSIS — I10 Essential (primary) hypertension: Secondary | ICD-10-CM | POA: Diagnosis not present

## 2024-07-16 DIAGNOSIS — N178 Other acute kidney failure: Secondary | ICD-10-CM

## 2024-07-16 DIAGNOSIS — E785 Hyperlipidemia, unspecified: Secondary | ICD-10-CM | POA: Diagnosis present

## 2024-07-16 DIAGNOSIS — J9621 Acute and chronic respiratory failure with hypoxia: Secondary | ICD-10-CM | POA: Diagnosis present

## 2024-07-16 DIAGNOSIS — Z8673 Personal history of transient ischemic attack (TIA), and cerebral infarction without residual deficits: Secondary | ICD-10-CM | POA: Diagnosis not present

## 2024-07-16 DIAGNOSIS — Z87442 Personal history of urinary calculi: Secondary | ICD-10-CM

## 2024-07-16 DIAGNOSIS — R1310 Dysphagia, unspecified: Secondary | ICD-10-CM | POA: Diagnosis present

## 2024-07-16 DIAGNOSIS — R2681 Unsteadiness on feet: Secondary | ICD-10-CM | POA: Diagnosis not present

## 2024-07-16 DIAGNOSIS — R000 Tachycardia, unspecified: Secondary | ICD-10-CM | POA: Diagnosis present

## 2024-07-16 DIAGNOSIS — Z79899 Other long term (current) drug therapy: Secondary | ICD-10-CM

## 2024-07-16 DIAGNOSIS — Z66 Do not resuscitate: Secondary | ICD-10-CM | POA: Diagnosis present

## 2024-07-16 DIAGNOSIS — I959 Hypotension, unspecified: Secondary | ICD-10-CM | POA: Diagnosis not present

## 2024-07-16 DIAGNOSIS — I130 Hypertensive heart and chronic kidney disease with heart failure and stage 1 through stage 4 chronic kidney disease, or unspecified chronic kidney disease: Secondary | ICD-10-CM | POA: Diagnosis present

## 2024-07-16 DIAGNOSIS — I63422 Cerebral infarction due to embolism of left anterior cerebral artery: Secondary | ICD-10-CM | POA: Diagnosis not present

## 2024-07-16 DIAGNOSIS — I2510 Atherosclerotic heart disease of native coronary artery without angina pectoris: Secondary | ICD-10-CM | POA: Diagnosis present

## 2024-07-16 DIAGNOSIS — J441 Chronic obstructive pulmonary disease with (acute) exacerbation: Secondary | ICD-10-CM | POA: Diagnosis not present

## 2024-07-16 DIAGNOSIS — M79604 Pain in right leg: Secondary | ICD-10-CM | POA: Diagnosis not present

## 2024-07-16 DIAGNOSIS — M6281 Muscle weakness (generalized): Secondary | ICD-10-CM | POA: Diagnosis not present

## 2024-07-16 DIAGNOSIS — I480 Paroxysmal atrial fibrillation: Secondary | ICD-10-CM | POA: Diagnosis present

## 2024-07-16 DIAGNOSIS — E870 Hyperosmolality and hypernatremia: Secondary | ICD-10-CM | POA: Diagnosis present

## 2024-07-16 DIAGNOSIS — N1832 Chronic kidney disease, stage 3b: Secondary | ICD-10-CM | POA: Diagnosis present

## 2024-07-16 DIAGNOSIS — R5381 Other malaise: Secondary | ICD-10-CM | POA: Diagnosis present

## 2024-07-16 DIAGNOSIS — Z9181 History of falling: Secondary | ICD-10-CM | POA: Diagnosis not present

## 2024-07-16 DIAGNOSIS — Z823 Family history of stroke: Secondary | ICD-10-CM

## 2024-07-16 DIAGNOSIS — I5032 Chronic diastolic (congestive) heart failure: Secondary | ICD-10-CM | POA: Diagnosis present

## 2024-07-16 DIAGNOSIS — J690 Pneumonitis due to inhalation of food and vomit: Principal | ICD-10-CM | POA: Diagnosis present

## 2024-07-16 DIAGNOSIS — R918 Other nonspecific abnormal finding of lung field: Secondary | ICD-10-CM | POA: Diagnosis not present

## 2024-07-16 DIAGNOSIS — J479 Bronchiectasis, uncomplicated: Secondary | ICD-10-CM | POA: Diagnosis not present

## 2024-07-16 DIAGNOSIS — R1312 Dysphagia, oropharyngeal phase: Secondary | ICD-10-CM | POA: Diagnosis not present

## 2024-07-16 DIAGNOSIS — R9389 Abnormal findings on diagnostic imaging of other specified body structures: Secondary | ICD-10-CM | POA: Diagnosis not present

## 2024-07-16 DIAGNOSIS — Z96652 Presence of left artificial knee joint: Secondary | ICD-10-CM | POA: Diagnosis present

## 2024-07-16 DIAGNOSIS — J189 Pneumonia, unspecified organism: Secondary | ICD-10-CM | POA: Diagnosis present

## 2024-07-16 DIAGNOSIS — J9601 Acute respiratory failure with hypoxia: Secondary | ICD-10-CM | POA: Insufficient documentation

## 2024-07-16 DIAGNOSIS — R051 Acute cough: Secondary | ICD-10-CM | POA: Diagnosis not present

## 2024-07-16 DIAGNOSIS — Z9981 Dependence on supplemental oxygen: Secondary | ICD-10-CM

## 2024-07-16 DIAGNOSIS — Z0389 Encounter for observation for other suspected diseases and conditions ruled out: Secondary | ICD-10-CM | POA: Diagnosis not present

## 2024-07-16 DIAGNOSIS — Z7401 Bed confinement status: Secondary | ICD-10-CM | POA: Diagnosis not present

## 2024-07-16 DIAGNOSIS — J9600 Acute respiratory failure, unspecified whether with hypoxia or hypercapnia: Secondary | ICD-10-CM | POA: Diagnosis not present

## 2024-07-16 DIAGNOSIS — R569 Unspecified convulsions: Secondary | ICD-10-CM | POA: Diagnosis not present

## 2024-07-16 DIAGNOSIS — Z9079 Acquired absence of other genital organ(s): Secondary | ICD-10-CM

## 2024-07-16 DIAGNOSIS — Z9841 Cataract extraction status, right eye: Secondary | ICD-10-CM

## 2024-07-16 DIAGNOSIS — I700 Atherosclerosis of aorta: Secondary | ICD-10-CM | POA: Diagnosis not present

## 2024-07-16 DIAGNOSIS — I4891 Unspecified atrial fibrillation: Secondary | ICD-10-CM | POA: Diagnosis not present

## 2024-07-16 DIAGNOSIS — R930 Abnormal findings on diagnostic imaging of skull and head, not elsewhere classified: Secondary | ICD-10-CM | POA: Diagnosis not present

## 2024-07-16 DIAGNOSIS — Z800 Family history of malignant neoplasm of digestive organs: Secondary | ICD-10-CM

## 2024-07-16 LAB — CBC WITH DIFFERENTIAL/PLATELET
Abs Immature Granulocytes: 0.26 K/uL — ABNORMAL HIGH (ref 0.00–0.07)
Basophils Absolute: 0.1 K/uL (ref 0.0–0.1)
Basophils Relative: 0 %
Eosinophils Absolute: 0 K/uL (ref 0.0–0.5)
Eosinophils Relative: 0 %
HCT: 40.8 % (ref 39.0–52.0)
Hemoglobin: 12.8 g/dL — ABNORMAL LOW (ref 13.0–17.0)
Immature Granulocytes: 2 %
Lymphocytes Relative: 4 %
Lymphs Abs: 0.6 K/uL — ABNORMAL LOW (ref 0.7–4.0)
MCH: 28.1 pg (ref 26.0–34.0)
MCHC: 31.4 g/dL (ref 30.0–36.0)
MCV: 89.7 fL (ref 80.0–100.0)
Monocytes Absolute: 1.1 K/uL — ABNORMAL HIGH (ref 0.1–1.0)
Monocytes Relative: 6 %
Neutro Abs: 15.3 K/uL — ABNORMAL HIGH (ref 1.7–7.7)
Neutrophils Relative %: 88 %
Platelets: 223 K/uL (ref 150–400)
RBC: 4.55 MIL/uL (ref 4.22–5.81)
RDW: 13.8 % (ref 11.5–15.5)
WBC: 17.4 K/uL — ABNORMAL HIGH (ref 4.0–10.5)
nRBC: 0 % (ref 0.0–0.2)

## 2024-07-16 LAB — PRO BRAIN NATRIURETIC PEPTIDE: Pro Brain Natriuretic Peptide: 1863 pg/mL — ABNORMAL HIGH (ref <300.0)

## 2024-07-16 LAB — URINALYSIS, ROUTINE W REFLEX MICROSCOPIC
Bilirubin Urine: NEGATIVE
Glucose, UA: NEGATIVE mg/dL
Hgb urine dipstick: NEGATIVE
Ketones, ur: NEGATIVE mg/dL
Leukocytes,Ua: NEGATIVE
Nitrite: NEGATIVE
Protein, ur: NEGATIVE mg/dL
Specific Gravity, Urine: 1.01 (ref 1.005–1.030)
pH: 6 (ref 5.0–8.0)

## 2024-07-16 LAB — COMPREHENSIVE METABOLIC PANEL WITH GFR
ALT: 44 U/L (ref 0–44)
AST: 64 U/L — ABNORMAL HIGH (ref 15–41)
Albumin: 3.2 g/dL — ABNORMAL LOW (ref 3.5–5.0)
Alkaline Phosphatase: 214 U/L — ABNORMAL HIGH (ref 38–126)
Anion gap: 13 (ref 5–15)
BUN: 44 mg/dL — ABNORMAL HIGH (ref 8–23)
CO2: 33 mmol/L — ABNORMAL HIGH (ref 22–32)
Calcium: 9.7 mg/dL (ref 8.9–10.3)
Chloride: 97 mmol/L — ABNORMAL LOW (ref 98–111)
Creatinine, Ser: 2.04 mg/dL — ABNORMAL HIGH (ref 0.61–1.24)
GFR, Estimated: 30 mL/min — ABNORMAL LOW (ref >60)
Glucose, Bld: 107 mg/dL — ABNORMAL HIGH (ref 70–99)
Potassium: 4.1 mmol/L (ref 3.5–5.1)
Sodium: 142 mmol/L (ref 135–145)
Total Bilirubin: 0.5 mg/dL (ref 0.0–1.2)
Total Protein: 8.1 g/dL (ref 6.5–8.1)

## 2024-07-16 LAB — PROTIME-INR
INR: 1.3 — ABNORMAL HIGH (ref 0.8–1.2)
Prothrombin Time: 16.9 s — ABNORMAL HIGH (ref 11.4–15.2)

## 2024-07-16 LAB — LACTIC ACID, PLASMA
Lactic Acid, Venous: 4.2 mmol/L (ref 0.5–1.9)
Lactic Acid, Venous: 3.4 mmol/L (ref 0.5–1.9)

## 2024-07-16 LAB — BLOOD GAS, VENOUS
Acid-Base Excess: 11.9 mmol/L — ABNORMAL HIGH (ref 0.0–2.0)
Bicarbonate: 38.9 mmol/L — ABNORMAL HIGH (ref 20.0–28.0)
O2 Saturation: 48.6 %
Patient temperature: 37
pCO2, Ven: 60 mmHg (ref 44–60)
pH, Ven: 7.42 (ref 7.25–7.43)

## 2024-07-16 LAB — TROPONIN T, HIGH SENSITIVITY: Troponin T High Sensitivity: 61 ng/L — ABNORMAL HIGH (ref 0–19)

## 2024-07-16 LAB — PROCALCITONIN: Procalcitonin: 0.25 ng/mL

## 2024-07-16 MED ORDER — SODIUM CHLORIDE 0.9 % IV SOLN
2.0000 g | Freq: Once | INTRAVENOUS | Status: AC
  Administered 2024-07-16: 2 g via INTRAVENOUS
  Filled 2024-07-16: qty 20

## 2024-07-16 MED ORDER — IPRATROPIUM-ALBUTEROL 0.5-2.5 (3) MG/3ML IN SOLN
3.0000 mL | Freq: Four times a day (QID) | RESPIRATORY_TRACT | Status: DC
  Administered 2024-07-16 – 2024-07-17 (×6): 3 mL via RESPIRATORY_TRACT
  Filled 2024-07-16 (×6): qty 3

## 2024-07-16 MED ORDER — ACETAMINOPHEN 500 MG PO TABS
1000.0000 mg | ORAL_TABLET | Freq: Three times a day (TID) | ORAL | Status: DC | PRN
  Administered 2024-07-19: 1000 mg via ORAL
  Filled 2024-07-16: qty 2

## 2024-07-16 MED ORDER — IPRATROPIUM-ALBUTEROL 0.5-2.5 (3) MG/3ML IN SOLN
6.0000 mL | Freq: Once | RESPIRATORY_TRACT | Status: AC
  Administered 2024-07-16: 6 mL via RESPIRATORY_TRACT
  Filled 2024-07-16: qty 6

## 2024-07-16 MED ORDER — TRAZODONE HCL 50 MG PO TABS
200.0000 mg | ORAL_TABLET | Freq: Every day | ORAL | Status: DC
  Administered 2024-07-16 – 2024-07-21 (×6): 200 mg via ORAL
  Filled 2024-07-16 (×6): qty 4

## 2024-07-16 MED ORDER — ALBUTEROL SULFATE (2.5 MG/3ML) 0.083% IN NEBU
2.5000 mg | INHALATION_SOLUTION | RESPIRATORY_TRACT | Status: DC | PRN
  Administered 2024-07-16: 2.5 mg via RESPIRATORY_TRACT
  Filled 2024-07-16: qty 3

## 2024-07-16 MED ORDER — VERAPAMIL HCL ER 360 MG PO CP24: 360.0000 mg | ORAL_CAPSULE | Freq: Every day | ORAL | Status: DC

## 2024-07-16 MED ORDER — SERTRALINE HCL 25 MG PO TABS
12.5000 mg | ORAL_TABLET | Freq: Every day | ORAL | Status: DC
  Administered 2024-07-16 – 2024-07-21 (×6): 12.5 mg via ORAL
  Filled 2024-07-16 (×9): qty 0.5

## 2024-07-16 MED ORDER — METHYLPREDNISOLONE SODIUM SUCC 125 MG IJ SOLR
125.0000 mg | Freq: Once | INTRAMUSCULAR | Status: AC
  Administered 2024-07-16: 125 mg via INTRAVENOUS
  Filled 2024-07-16: qty 2

## 2024-07-16 MED ORDER — SODIUM CHLORIDE 0.9 % IV BOLUS
1000.0000 mL | Freq: Once | INTRAVENOUS | Status: AC
  Administered 2024-07-16: 1000 mL via INTRAVENOUS

## 2024-07-16 MED ORDER — SODIUM CHLORIDE 0.9 % IV SOLN
500.0000 mg | Freq: Once | INTRAVENOUS | Status: AC
  Administered 2024-07-16: 500 mg via INTRAVENOUS
  Filled 2024-07-16: qty 5

## 2024-07-16 MED ORDER — TRAMADOL HCL 50 MG PO TABS
50.0000 mg | ORAL_TABLET | Freq: Four times a day (QID) | ORAL | Status: DC | PRN
  Administered 2024-07-18 – 2024-07-20 (×5): 50 mg via ORAL
  Filled 2024-07-16 (×5): qty 1

## 2024-07-16 MED ORDER — APIXABAN 2.5 MG PO TABS
2.5000 mg | ORAL_TABLET | Freq: Two times a day (BID) | ORAL | Status: DC
  Administered 2024-07-16 – 2024-07-22 (×12): 2.5 mg via ORAL
  Filled 2024-07-16 (×13): qty 1

## 2024-07-16 MED ORDER — TORSEMIDE 20 MG PO TABS: 100.0000 mg | ORAL_TABLET | Freq: Every day | ORAL | Status: DC

## 2024-07-16 MED ORDER — PREDNISONE 20 MG PO TABS
40.0000 mg | ORAL_TABLET | Freq: Every day | ORAL | Status: DC
  Administered 2024-07-17 – 2024-07-21 (×5): 40 mg via ORAL
  Filled 2024-07-16 (×5): qty 2

## 2024-07-16 NOTE — ED Notes (Signed)
Patient placed on 3L NC  

## 2024-07-16 NOTE — H&P (Signed)
 History and Physical    Patient: Ryan Pace FMW:[id number] DOB: 1929-01-09 DOA: 07/16/2024 DOS: the patient was seen and examined on 07/16/2024 PCP: Lenon Layman ORN, MD  Patient coming from: LTC  Chief Complaint:  Chief Complaint  Patient presents with   Loss of Consciousness   HPI: Ryan Pace is a 89 y.o. male with medical history significant of PAF, history of stroke, COPD, chronic kidney disease stage IIIb, coronary artery disease, essential hypertension, obstructive sleep apnea, severe debility with bedbound status, who presents to the hospital after syncope.  Currently living in [place] long-term care facility, he is bedbound at baseline.  He had 2 episodes of syncope before admission.  Upon arrival in the hospital, patient was found to have significant hypoxemia, with oxygen saturation at 84%, was placed on 3 L oxygen.  He also has significant wheezing, started on IV steroids.  Patient also had recurrent choking episodes.  Has significant cough. He has a poor appetite, but no nausea vomiting. Review of Systems: As mentioned in the history of present illness. All other systems reviewed and are negative. Past Medical History:  Diagnosis Date   Anemia    Basal cell carcinoma    Dysrhythmia    atrial fibrillation   Headache    Hyperlipidemia    Kidney stones    Prostatitis    Stroke [facility])    Past Surgical History:  Procedure Laterality Date   APPENDECTOMY     CARDIAC CATHETERIZATION     CATARACT EXTRACTION, BILATERAL     COLONOSCOPY     1998, 2003, 2011   DUPUYTREN CONTRACTURE RELEASE Bilateral    DUPUYTREN CONTRACTURE RELEASE Right 01/11/2016   Procedure: DUPUYTREN CONTRACTURE RELEASE;  Surgeon: Kayla Pinal, MD;  Location: [facility] ORS;  Service: Orthopedics;  Laterality: Right;   FLEXIBLE SIGMOIDOSCOPY     1993   GREEN LIGHT LASER TURP (TRANSURETHRAL RESECTION OF PROSTATE N/A 02/15/2015   Procedure: GREEN LIGHT LASER TURP (TRANSURETHRAL RESECTION OF  PROSTATE;  Surgeon: Ozell JONELLE Burkes, MD;  Location: [facility] ORS;  Service: Urology;  Laterality: N/A;   TONSILLECTOMY     TOTAL KNEE ARTHROPLASTY Left 10/29/2018   Procedure: TOTAL KNEE ARTHROPLASTY-LEFT;  Surgeon: Leora Lynwood JONELLE, MD;  Location: [facility] ORS;  Service: Orthopedics;  Laterality: Left;   Social History:  reports that he has never smoked. He has quit using smokeless tobacco. He reports that he does not drink alcohol  and does not use drugs.  Allergies  Allergen Reactions   Penicillins Rash    Tolerated ceftriaxone  in Jan and Aug 2024.  Also received cephalexin  in Aug 2024    Family History  Problem Relation Age of Onset   Stroke Mother    Colon cancer Mother    Anuerysm Father     Prior to Admission medications   Medication Sig Start Date End Date Taking? Authorizing Provider  atorvastatin  (LIPITOR) 40 MG tablet Take 40 mg by mouth daily. 06/23/24  Yes [provider]  acetaminophen  (TYLENOL ) 500 MG tablet Take 2 tablets (1,000 mg total) by mouth every 8 (eight) hours as needed for mild pain (pain score 1-3). 07/30/23   Lenon Marien CROME, MD  apixaban  (ELIQUIS ) 2.5 MG TABS tablet Take 2.5 mg by mouth 2 (two) times daily.    [provider]  bisacodyl  (DULCOLAX) 10 MG suppository Place 10 mg rectally daily as needed for moderate constipation.    [provider]  cholecalciferol  (VITAMIN D ) 1000 units tablet Take 1,000 Units by mouth daily.  [provider]  cyclobenzaprine (FLEXERIL) 5 MG tablet Take 5 mg by mouth every 8 (eight) hours as needed for muscle spasms.    [provider]  ferrous sulfate  325 (65 FE) MG EC tablet Take 325 mg by mouth every other day.    [provider]  gabapentin  (NEURONTIN ) 100 MG capsule Take 200 mg by mouth at bedtime.    [provider]  guaiFENesin  (ROBITUSSIN) 100 MG/5ML liquid Take 10 mLs by mouth every 4 (four) hours as needed for cough or to loosen phlegm.    [provider]  Lactulose  20 GM/30ML SOLN Take 15 mLs (10 g total) by mouth daily as needed. 07/30/23   Lenon Marien CROME, MD  loratadine  (CLARITIN ) 10 MG tablet Take 10 mg by mouth daily.    [provider]  magnesium  hydroxide (MILK OF MAGNESIA) 400 MG/5ML suspension Take 30 mLs by mouth every 4 (four) hours as needed for moderate constipation.    [provider]  nystatin  cream (MYCOSTATIN ) Apply 1 Application topically 2 (two) times daily as needed (rash). (Apply to groin folds)    [provider]  polyethylene glycol (MIRALAX  / GLYCOLAX ) 17 g packet Take 17 g by mouth 2 (two) times daily as needed for moderate constipation or severe constipation.    [provider]  potassium chloride (MICRO-K) 10 MEQ CR capsule Take 10 mEq by mouth daily.    [provider]  sertraline  (ZOLOFT ) 25 MG tablet Take 0.5 tablets (12.5 mg total) by mouth at bedtime. 07/30/23   Lenon Marien CROME, MD  torsemide (DEMADEX) 100 MG tablet Take 100 mg by mouth daily.    [provider]  traMADol  (ULTRAM ) 50 MG tablet Take 1 tablet (50 mg total) by mouth every 6 (six) hours as needed for up to 8 doses. 12/12/23   Paci, Karina M, MD  traZODone  (DESYREL ) 100 MG tablet Take 200 mg by mouth at bedtime.    [provider]  verapamil  (VERELAN ) 360 MG 24 hr capsule Take 360 mg by mouth at bedtime.    [provider]  zinc oxide 20 % ointment Apply 1 Application topically as needed for irritation.    [provider]    Physical Exam: Vitals:   07/16/24 1034 07/16/24 1050 07/16/24 1100 07/16/24 1130  BP: 116/67  (!) 98/54 (!) 103/59  Pulse: (!) 103 99 100 (!) 101  Resp: (!) 22 19 19  (!) 24  Temp: (!) 97.4 F (36.3 C)     TempSrc: Oral     SpO2: (!) 86% 90% 97% 93%   Physical Exam Constitutional:      General: He is not in acute distress.    Appearance: He is ill-appearing.  HENT:     Head: Normocephalic and atraumatic.     Nose: Nose  normal. No congestion.     Mouth/Throat:     Mouth: Mucous membranes are moist.     Pharynx: Oropharynx is clear.  Eyes:     Extraocular Movements: Extraocular movements intact.     Conjunctiva/sclera: Conjunctivae normal.     Pupils: Pupils are equal, round, and reactive to light.  Neck:     Vascular: No carotid bruit.  Cardiovascular:     Rate and Rhythm: Normal rate and regular rhythm.     Heart sounds: No murmur heard.    No friction rub. No gallop.  Pulmonary:     Effort: Pulmonary effort is normal.     Breath sounds: Wheezing  and rhonchi present.  Abdominal:     General: Abdomen is flat. Bowel sounds are normal. There is no distension.     Palpations: Abdomen is soft.     Tenderness: There is no abdominal tenderness.  Musculoskeletal:     Cervical back: Normal range of motion and neck supple. No rigidity.     Right lower leg: No edema.     Left lower leg: No edema.  Lymphadenopathy:     Cervical: No cervical adenopathy.  Skin:    General: Skin is warm and dry.     Coloration: Skin is not jaundiced.  Neurological:     General: No focal deficit present.     Mental Status: He is alert and oriented to person, place, and time.     Cranial Nerves: No cranial nerve deficit.  Psychiatric:        Mood and Affect: Mood normal.        Behavior: Behavior normal.     Data Reviewed:  Chest x-ray did not show any acute changes. Lab results reviewed.  Assessment and Plan: Acute hypoxemic respiratory failure secondary to COPD exacerbation. COPD exacerbation. Patient had a significant hypoxemia at the time of admission, this is mostly due to COPD exacerbation.  Patient has been treated with IV steroids, will continue oral prednisone.  Also start scheduled DuoNeb. Patient also has significant choking episode, aspiration may also play a role.  Obtain speech therapy evaluation.  Start a dysphagia 2 diet. Chest x-ray did not show any evidence of pneumonia.  Acute kidney injury on  chronic kidney disease stage IIIb. Reviewed prior labs, patient appeared to have been worsening renal function consistent with acute kidney injury. Continue to follow.  Chronic diastolic congestive heart failure. Echocardiogram performed a year ago showed ejection fraction 60 to 65% with moderate LV hypertrophy, grade 1 diastolic dysfunction. Patient has elevated proBNP, this is probably due to renal disease. Overall, patient does not have volume overload.  Continue to follow.  Paroxysmal atrial fibrillation. History of stroke. Reviewed EKG, currently in sinus rhythm.  Continue anticoagulation.  Syncope and collapse. Most likely secondary to hypoxia, monitor telemetry.  Echocardiogram showed normal ejection fraction last year.     Advance Care Planning:   Code Status: Limited: Do not attempt resuscitation (DNR) -DNR-LIMITED -Do Not Intubate/DNI discussed with POA, patient is to DO NOT RESUSCITATE status.  Consults: None  Family Communication: Son updated at bedside.  Severity of Illness: The appropriate patient status for this patient is INPATIENT. Inpatient status is judged to be reasonable and necessary in order to provide the required intensity of service to ensure the patient's safety. The patient's presenting symptoms, physical exam findings, and initial radiographic and laboratory data in the context of their chronic comorbidities is felt to place them at high risk for further clinical deterioration. Furthermore, it is not anticipated that the patient will be medically stable for discharge from the hospital within 2 midnights of admission.   * I certify that at the point of admission it is my clinical judgment that the patient will require inpatient hospital care spanning beyond 2 midnights from the point of admission due to high intensity of service, high risk for further deterioration and high frequency of surveillance required.*  Author: Murvin Mana, MD 07/16/2024 12:24  PM  For on call review [email or web address].

## 2024-07-16 NOTE — ED Triage Notes (Signed)
 Patient to ED via [facility] from [place] [facility] for syncope x 2; one witnessed with EMS and they report it lasted approximately 5 minutes. EMS reports during episode patient was sitting on stretcher and had right sided deviation and unable to follow commands.

## 2024-07-16 NOTE — ED Notes (Signed)
 Dr Laurita notified that pt has some expiratory wheezing. Dr Laurita gave order for q4 PRN albuterol neb.

## 2024-07-16 NOTE — ED Provider Notes (Signed)
 [facility] Provider Note    Event Date/Time   First MD Initiated Contact with Patient 07/16/24 1039     (approximate)   History   Loss of Consciousness   HPI  Ryan Pace is a 89 y.o. male who presents to the ED for evaluation of Loss of Consciousness   I review a PCP visit from yesterday.  Patient with history of COPD, stroke and paroxysmal A-fib on Eliquis .  Patient reportedly started on doxycycline and azithromycin  for pneumonia and abnormal chest x-ray as an outpatient but I cannot see these results but can see MAR with these new antibiotics provided.  First dose this morning  Patient presents after witnessed episode of syncope.  EMS reports while seated patient had episode of unresponsiveness/syncope.  No seizure activity reported though they do report his eyes were deviated to the right.  This lasted 3 to 5 minutes and self resolved.   EMS reports an additional episode on route with patient under their care, 1 minute of unresponsiveness, eyes deviated to the right, arms/legs flaccid without seizure activity or stiffness.   Physical Exam   Triage Vital Signs: ED Triage Vitals [07/16/24 1034]  Encounter Vitals Group     BP 116/67     Girls Systolic BP Percentile      Girls Diastolic BP Percentile      Boys Systolic BP Percentile      Boys Diastolic BP Percentile      Pulse Rate (!) 103     Resp (!) 22     Temp (!) 97.4 F (36.3 C)     Temp Source Oral     SpO2 (!) 86 %     Weight      Height      Head Circumference      Peak Flow      Pain Score      Pain Loc      Pain Education      Exclude from Growth Chart     Most recent vital signs: Vitals:   07/16/24 1100 07/16/24 1130  BP: (!) 98/54 (!) 103/59  Pulse: 100 (!) 101  Resp: 19 (!) 24  Temp:    SpO2: 97% 93%    General: Awake, no distress.  CV:  Good peripheral perfusion.  Tachycardic and irregular Resp:  Mild tachypnea without distress, wheezing throughout with  slightly decreased airflow Abd:  No distention.  MSK:  No deformity noted.  Neuro:  No focal deficits appreciated. Other:     ED Results / Procedures / Treatments   Labs (all labs ordered are listed, but only abnormal results are displayed) Labs Reviewed  LACTIC ACID, PLASMA - Abnormal; Notable for the following components:      Result Value   Lactic Acid, Venous 4.2 (*)    All other components within normal limits  COMPREHENSIVE METABOLIC PANEL WITH GFR - Abnormal; Notable for the following components:   Chloride 97 (*)    CO2 33 (*)    Glucose, Bld 107 (*)    BUN 44 (*)    Creatinine, Ser 2.04 (*)    Albumin 3.2 (*)    AST 64 (*)    Alkaline Phosphatase 214 (*)    GFR, Estimated 30 (*)    All other components within normal limits  CBC WITH DIFFERENTIAL/PLATELET - Abnormal; Notable for the following components:   WBC 17.4 (*)    Hemoglobin 12.8 (*)    Neutro Abs 15.3 (*)  Lymphs Abs 0.6 (*)    Monocytes Absolute 1.1 (*)    Abs Immature Granulocytes 0.26 (*)    All other components within normal limits  PROTIME-INR - Abnormal; Notable for the following components:   Prothrombin Time 16.9 (*)    INR 1.3 (*)    All other components within normal limits  PRO BRAIN NATRIURETIC PEPTIDE - Abnormal; Notable for the following components:   Pro Brain Natriuretic Peptide 1,863.0 (*)    All other components within normal limits  BLOOD GAS, VENOUS - Abnormal; Notable for the following components:   Bicarbonate 38.9 (*)    Acid-Base Excess 11.9 (*)    All other components within normal limits  CULTURE, BLOOD (ROUTINE X 2)  CULTURE, BLOOD (ROUTINE X 2)  RESP PANEL BY RT-PCR (RSV, FLU A&B, COVID)  RVPGX2  LACTIC ACID, PLASMA  URINALYSIS, ROUTINE W REFLEX MICROSCOPIC  PROCALCITONIN  TROPONIN T, HIGH SENSITIVITY    EKG A-fib with a rate of 98 bpm, no STEMI.  No high-grade block  RADIOLOGY 1 view CXR interpreted by me with multifocal opacities, left base, right midlung.   Seems to be chronic scarring when compared to previous x-ray CT head interpreted by me without evidence of acute intracranial pathology  Official radiology report(s): DG Chest Port 1 View Result Date: 07/16/2024 EXAM: 1 VIEW(S) XRAY OF THE CHEST 07/16/2024 11:11:45 AM COMPARISON: 07/28/2023 CLINICAL HISTORY: 89 year old male. Evaluation for pneumonia. History of COPD and acute cough. FINDINGS: LUNGS AND PLEURA: Mildly low lung volumes, stable. Chronic pulmonary interstitial opacities with bilateral subpleural scarring, as demonstrated on 2024 CT. No acute lung opacity. Stable Elevated right hemidiaphragm. No pleural effusion. No pneumothorax. HEART AND MEDIASTINUM: Aortic arch calcifications noted. No acute abnormality of the cardiac and mediastinal silhouettes. BONES AND SOFT TISSUES: No acute osseous abnormality. IMPRESSION: 1. No acute cardiopulmonary abnormality. Electronically signed by: Helayne Hurst MD 07/16/2024 11:39 AM EST RP Workstation: [id number]   CT HEAD WO CONTRAST ( ) Result Date: 07/16/2024 EXAM: CT HEAD WITHOUT CONTRAST 07/16/2024 11:31:45 AM TECHNIQUE: CT of the head was performed without the administration of intravenous contrast. Automated exposure control, iterative reconstruction, and/or weight based adjustment of the mA/kV was utilized to reduce the radiation dose to as low as reasonably achievable. COMPARISON: Brain MRI 04/11/2023. Head CT 04/13/2023. CLINICAL HISTORY: 89 year old male. Evaluation for intracranial hemorrhage. History of CVA, reported syncope vs seizure at facility. FINDINGS: BRAIN AND VENTRICLES: No acute hemorrhage. No evidence of acute infarct. No hydrocephalus. No extra-axial collection. No mass effect or midline shift. Stable brain volume. Chronic periventricular white matter hypodensity, more pronounced in the left hemisphere and at the left corona radiata. Similar chronic asymmetry in the basal ganglia, greater on the left. Stable gray-white differentiation.  Calcified atherosclerosis at the skull base. No suspicious intracranial vascular hyperdensity. ORBITS: No acute abnormality. SINUSES: Visible paranasal sinuses, middle ears, and mastoids remain well aerated. SOFT TISSUES AND SKULL: No acute soft tissue abnormality. No skull fracture. IMPRESSION: 1. No acute intracranial abnormality. 2. Stable non-contrast CT appearance of chronic small vessel disease, more pronounced in the left hemisphere. Electronically signed by: Helayne Hurst MD 07/16/2024 11:37 AM EST RP Workstation: [id number]    PROCEDURES and INTERVENTIONS:  .Critical Care  Performed by: Claudene Rover, MD Authorized by: Claudene Rover, MD   Critical care provider statement:    Critical care time (minutes):  30   Critical care time was exclusive of:  Separately billable procedures and treating other patients   Critical care was necessary to  treat or prevent imminent or life-threatening deterioration of the following conditions:  Respiratory failure and sepsis   Critical care was time spent personally by me on the following activities:  Development of treatment plan with patient or surrogate, discussions with consultants, evaluation of patient's response to treatment, examination of patient, ordering and review of laboratory studies, ordering and review of radiographic studies, ordering and performing treatments and interventions, pulse oximetry, re-evaluation of patient's condition and review of old charts .1-3 Lead EKG Interpretation  Performed by: Claudene Rover, MD Authorized by: Claudene Rover, MD     Interpretation: normal     ECG rate:  96   ECG rate assessment: normal     Rhythm: atrial fibrillation     Ectopy: none     Conduction: normal     Medications  azithromycin  (ZITHROMAX ) 500 mg in sodium chloride  0.9 % 250 mL IVPB (500 mg Intravenous New Bag/Given 07/16/24 1155)  ipratropium-albuterol (DUONEB) 0.5-2.5 (3) MG/3ML nebulizer solution 6 mL (6 mLs Nebulization Given 07/16/24  1047)  methylPREDNISolone sodium succinate (SOLU-MEDROL) 125 mg/2 mL injection 125 mg (125 mg Intravenous Given 07/16/24 1047)  cefTRIAXone  (ROCEPHIN ) 2 g in sodium chloride  0.9 % 100 mL IVPB (0 g Intravenous Stopped 07/16/24 1149)  sodium chloride  0.9 % bolus 1,000 mL (1,000 mLs Intravenous New Bag/Given 07/16/24 1157)     IMPRESSION / MDM / ASSESSMENT AND PLAN / ED COURSE  I reviewed the triage vital signs and the nursing notes.  Differential diagnosis includes, but is not limited to, ACS, PTX, PNA, muscle strain/spasm, PE, dissection, anxiety, pleural effusion  {Patient presents with symptoms of an acute illness or injury that is potentially life-threatening.  Patient presents to the ED with witnessed syncopal episodes and hypoxic respiratory failure.  Seems to be at behavioral baseline here without seizure activity or noted syncope in the ED, no evidence of cardiac dysrhythmias.  He is hypoxic requiring nasal cannula, tachycardic and tachypneic.  No shock or indication for pressors at this point.  Our x-ray does not demonstrate pneumonia but he does have leukocytosis and lactic acidosis.  Cultures are drawn and he is started on CAP coverage antibiotics.   CT head reassuring considering his anticoagulated status.  No ICH.  Will return to CT for CT chest to further elucidate underlying pneumonia.    He does not appear grossly volume overloaded on exam.  His elevated BNP is noted.  Provide 1 L of IV fluids related to sepsis protocols  Clinical Course as of 07/16/24 1214  Thu Jul 16, 2024  1112 Reassessed and discussed plan of care [DS]  1204 Reassessed.  Son at the bedside.  Discussed workup thus far and plan of care [DS]  1211 I consult with medicine who agrees to admit.  We discussed pending studies [DS]    Clinical Course User Index [DS] Claudene Rover, MD     FINAL CLINICAL IMPRESSION(S) / ED DIAGNOSES   Final diagnoses:  COPD exacerbation (HCC)  Syncope and collapse      Rx / DC Orders   ED Discharge Orders     None        Note:  This document was prepared using Dragon voice recognition software and may include unintentional dictation errors.   Claudene Rover, MD 07/16/24 [phone]

## 2024-07-16 NOTE — ED Notes (Signed)
 Pt provided peri care. New brief and bed pad applied

## 2024-07-17 DIAGNOSIS — R55 Syncope and collapse: Secondary | ICD-10-CM | POA: Diagnosis not present

## 2024-07-17 LAB — BASIC METABOLIC PANEL WITH GFR
Anion gap: 11 (ref 5–15)
BUN: 47 mg/dL — ABNORMAL HIGH (ref 8–23)
CO2: 33 mmol/L — ABNORMAL HIGH (ref 22–32)
Calcium: 9.1 mg/dL (ref 8.9–10.3)
Chloride: 101 mmol/L (ref 98–111)
Creatinine, Ser: 2.04 mg/dL — ABNORMAL HIGH (ref 0.61–1.24)
GFR, Estimated: 30 mL/min — ABNORMAL LOW (ref >60)
Glucose, Bld: 125 mg/dL — ABNORMAL HIGH (ref 70–99)
Potassium: 4.5 mmol/L (ref 3.5–5.1)
Sodium: 145 mmol/L (ref 135–145)

## 2024-07-17 LAB — MRSA NEXT GEN BY PCR, NASAL: MRSA by PCR Next Gen: DETECTED — AB

## 2024-07-17 MED ORDER — NEPRO/CARBSTEADY PO LIQD
237.0000 mL | Freq: Two times a day (BID) | ORAL | Status: DC
  Administered 2024-07-17 – 2024-07-22 (×8): 237 mL via ORAL

## 2024-07-17 MED ORDER — SERTRALINE HCL 50 MG PO TABS
50.0000 mg | ORAL_TABLET | Freq: Every day | ORAL | Status: DC
  Administered 2024-07-18 – 2024-07-22 (×5): 50 mg via ORAL
  Filled 2024-07-17 (×5): qty 1

## 2024-07-17 MED ORDER — SODIUM CHLORIDE 0.9 % IV SOLN
500.0000 mg | Freq: Every day | INTRAVENOUS | Status: AC
  Administered 2024-07-17 – 2024-07-18 (×2): 500 mg via INTRAVENOUS
  Filled 2024-07-17 (×2): qty 5

## 2024-07-17 NOTE — Plan of Care (Signed)
  Problem: Clinical Measurements: Goal: Ability to maintain clinical measurements within normal limits will improve Outcome: Progressing   Problem: Elimination: Goal: Will not experience complications related to bowel motility Outcome: Progressing   Problem: Pain Managment: Goal: General experience of comfort will improve and/or be controlled Outcome: Progressing   Problem: Safety: Goal: Ability to remain free from injury will improve Outcome: Progressing   Problem: Skin Integrity: Goal: Risk for impaired skin integrity will decrease Outcome: Progressing   Problem: Coping: Goal: Level of anxiety will decrease Outcome: Progressing

## 2024-07-17 NOTE — Progress Notes (Signed)
 Progress Note    Ryan Pace  FMW:[id number] DOB: 07/20/1929  DOA: 07/16/2024 PCP: Lenon Layman ORN, MD      Brief Narrative:    Medical records reviewed and are as summarized below:  Ryan Pace is a 89 y.o. male  with medical history significant of PAF, history of stroke, COPD, chronic kidney disease stage IIIb, coronary artery disease, essential hypertension, obstructive sleep apnea, severe debility with bedbound status, who was brought to the hospital from [facility] facility because of a syncopal episode.  He is bedbound at baseline.  Apparently, he had 2 syncopal episodes prior to admission.  He was found to be hypoxic with oxygen saturation of 84%.  He was admitted to the hospital for COPD exacerbation and pneumonia complicated by acute on chronic hypoxic respiratory failure.  CT chest with contrast IMPRESSION: 1. Significantly progressive diffuse peribronchovascular nodularity with more confluent airspace opacities dependently in both lower lobes compared with previous CT of 11 months ago. Findings are most consistent with progressive atypical infection, including fungal and atypical mycobacterial infection. Differential includes organizing pneumonia. Consider bronchoscopy. 2. Multiple enlarging, predominately right-sided solid pulmonary nodules, likely part of the same process. However, given the size of the nodules, malignancy not excluded. 3. New complex dependent small right greater than left pleural effusions. 4. New subpleural air cyst anteriorly in the right upper lobe with slightly thickened walls, but no significant solid components or pneumothorax. 5. Stable central enlargement of the pulmonary arteries consistent with pulmonary arterial hypertension. 6.  Aortic Atherosclerosis (ICD10-I70.0).   Assessment/Plan:   Principal Problem:   Syncope and collapse Active Problems:   Acute renal failure superimposed on stage 3b  chronic kidney disease (HCC)   Paroxysmal A-fib (HCC)   Essential hypertension   CAD (coronary artery disease), native coronary artery   Acute hypoxemic respiratory failure (HCC)   COPD with acute exacerbation (HCC)   COPD exacerbation (HCC)    Body mass index is 30.05 kg/m.   S/p syncope: Probably due to hypoxia   COPD exacerbation: Continue prednisone  and bronchodilators   Community-acquired pneumonia, atypical infection: Significantly progressive diffuse peribronchovascular nodularity with more confluent airspace opacities in both lower lobes on CT chest.  Continue azithromycin . Consulted Dr. Malka, pulmonologist, to assist with management.   Acute hypoxic respiratory failure: Oxygen tapered down from 4 L/min to 2 L/min via nasal cannula.   Probable AKI versus CKD stage IIIb: Monitor creatinine.   Paroxysmal atrial fibrillation, history of stroke: Continue Eliquis    Chronic diastolic CHF: Compensated 2D echo in August 2024 showed EF estimated at 60 to 65%, grade 1 diastolic dysfunction.   Diet Order             DIET DYS 2 Room service appropriate? Yes with Assist; Fluid consistency: Nectar Thick  Diet effective now                                  Consultants: Pulmonologist  Procedures: None    Medications:    apixaban   2.5 mg Oral BID   feeding supplement (NEPRO CARB STEADY)  237 mL Oral BID BM   ipratropium-albuterol   3 mL Nebulization Q6H   predniSONE   40 mg Oral Q breakfast   sertraline   12.5 mg Oral QHS   traZODone   200 mg Oral QHS   Continuous Infusions:  azithromycin  (ZITHROMAX ) 500 mg in sodium chloride  0.9 % 250 mL IVPB  Anti-infectives (From admission, onward)    Start     Dose/Rate Route Frequency Ordered Stop   07/17/24 1000  azithromycin  (ZITHROMAX ) 500 mg in sodium chloride  0.9 % 250 mL IVPB        500 mg 250 mL/hr over 60 Minutes Intravenous Daily 07/17/24 0759 07/19/24 0959   07/16/24 1115   cefTRIAXone  (ROCEPHIN ) 2 g in sodium chloride  0.9 % 100 mL IVPB        2 g 200 mL/hr over 30 Minutes Intravenous  Once 07/16/24 1113 07/16/24 1149   07/16/24 1115  azithromycin  (ZITHROMAX ) 500 mg in sodium chloride  0.9 % 250 mL IVPB        500 mg 250 mL/hr over 60 Minutes Intravenous  Once 07/16/24 1113 07/16/24 1318              Family Communication/Anticipated D/C date and plan/Code Status   DVT prophylaxis: apixaban  (ELIQUIS ) tablet 2.5 mg Start: 07/16/24 2200 SCDs Start: 07/16/24 1224 apixaban  (ELIQUIS ) tablet 2.5 mg     Code Status: Limited: Do not attempt resuscitation (DNR) -DNR-LIMITED -Do Not Intubate/DNI   Family Communication: Plan discussed with Acupuncturist, son, at the bedside Disposition Plan: Plan to discharge to long-term care facility   Status is: Inpatient Remains inpatient appropriate because: COPD exacerbation, pneumonia       Subjective:   Interval events noted.  He complains of dry cough.  No shortness of breath or chest pain.  Ryan Pace, son, was at the bedside.  Objective:    Vitals:   07/17/24 0406 07/17/24 0407 07/17/24 0804 07/17/24 0841  BP: 135/85  139/81   Pulse: 100  (!) 106   Resp: 16  18   Temp: 97.6 F (36.4 C)  97.8 F (36.6 C)   TempSrc: Oral     SpO2: (!) 85% 97% 94%   Height:    5' 9 (1.753 m)   No data found.   Intake/Output Summary (Last 24 hours) at 07/17/2024 1159 Last data filed at 07/17/2024 0900 Gross per 24 hour  Intake 1370 ml  Output --  Net 1370 ml   There were no vitals filed for this visit.  Exam:  GEN: NAD SKIN: Warm and dry EYES: No pallor or icterus ENT: MMM CV: RRR PULM: Bilateral wheezing and bilateral rales ABD: soft, ND, NT, +BS CNS: AAO x 3, non focal EXT: No edema or tenderness        Data Reviewed:   I have personally reviewed following labs and imaging studies:  Labs: Labs show the following:   Basic Metabolic Panel: Recent Labs  Lab 07/16/24 1041 07/17/24 0348  NA 142  145  K 4.1 4.5  CL 97* 101  CO2 33* 33*  GLUCOSE 107* 125*  BUN 44* 47*  CREATININE 2.04* 2.04*  CALCIUM  9.7 9.1   GFR CrCl cannot be calculated (Unknown ideal weight.). Liver Function Tests: Recent Labs  Lab 07/16/24 1041  AST 64*  ALT 44  ALKPHOS 214*  BILITOT 0.5  PROT 8.1  ALBUMIN 3.2*   No results for input(s): LIPASE, AMYLASE in the last 168 hours. No results for input(s): AMMONIA in the last 168 hours. Coagulation profile Recent Labs  Lab 07/16/24 1041  INR 1.3*    CBC: Recent Labs  Lab 07/16/24 1041  WBC 17.4*  NEUTROABS 15.3*  HGB 12.8*  HCT 40.8  MCV 89.7  PLT 223   Cardiac Enzymes: No results for input(s): CKTOTAL, CKMB, CKMBINDEX, TROPONINI in the last 168 hours. BNP (last 3  results) Recent Labs    07/16/24 1041  PROBNP 1,863.0*   CBG: No results for input(s): GLUCAP in the last 168 hours. D-Dimer: No results for input(s): DDIMER in the last 72 hours. Hgb A1c: No results for input(s): HGBA1C in the last 72 hours. Lipid Profile: No results for input(s): CHOL, HDL, LDLCALC, TRIG, CHOLHDL, LDLDIRECT in the last 72 hours. Thyroid  function studies: No results for input(s): TSH, T4TOTAL, T3FREE, THYROIDAB in the last 72 hours.  Invalid input(s): FREET3 Anemia work up: No results for input(s): VITAMINB12, FOLATE, FERRITIN, TIBC, IRON, RETICCTPCT in the last 72 hours. Sepsis Labs: Recent Labs  Lab 07/16/24 1041 07/16/24 1216 07/16/24 1932  PROCALCITON  --   --  0.25  WBC 17.4*  --   --   LATICACIDVEN 4.2* 3.4*  --     Microbiology Recent Results (from the past 240 hours)  Blood Culture (routine x 2)     Status: None (Preliminary result)   Collection Time: 07/16/24 10:41 AM   Specimen: BLOOD  Result Value Ref Range Status   Specimen Description BLOOD RIGHT ANTECUBITAL  Final   Special Requests   Final    BOTTLES DRAWN AEROBIC AND ANAEROBIC Blood Culture adequate volume    Culture   Final    NO GROWTH < 24 HOURS Performed at [facility], [address], [place], [place] [address]    Report Status PENDING  Incomplete  Blood Culture (routine x 2)     Status: None (Preliminary result)   Collection Time: 07/16/24 11:00 AM   Specimen: BLOOD  Result Value Ref Range Status   Specimen Description BLOOD LEFT ANTECUBITAL  Final   Special Requests   Final    BOTTLES DRAWN AEROBIC AND ANAEROBIC Blood Culture results may not be optimal due to an inadequate volume of blood received in culture bottles   Culture   Final    NO GROWTH < 24 HOURS Performed at [facility], [address] [place], [place] [address]    Report Status PENDING  Incomplete    Procedures and diagnostic studies:  CT Chest Wo Contrast Result Date: 07/16/2024 CLINICAL DATA:  Evaluate infiltrate/pneumonia. EXAM: CT CHEST WITHOUT CONTRAST TECHNIQUE: Multidetector CT imaging of the chest was performed following the standard protocol without IV contrast. RADIATION DOSE REDUCTION: This exam was performed according to the departmental dose-optimization program which includes automated exposure control, adjustment of the mA and/or kV according to patient size and/or use of iterative reconstruction technique. COMPARISON:  Radiographs 07/16/2024 and 07/28/2023. Chest CT 07/28/2023. FINDINGS: Cardiovascular: Atherosclerosis of the aorta, great vessels and coronary arteries. There is stable central enlargement of the pulmonary arteries. The heart size is normal. There is no pericardial effusion. Mediastinum/Nodes: There are no enlarged mediastinal, hilar or axillary lymph nodes.Hilar assessment is limited by the lack of intravenous contrast, although the hilar contours appear unchanged. The thyroid  gland, trachea and esophagus demonstrate no significant findings. Lungs/Pleura: New complex dependent small right greater than left pleural effusions. No pneumothorax. Chronic but progressive diffuse  central airway thickening and bronchiectasis with increased patchy ground-glass opacities at both lung apices. Significantly progressive diffuse peribronchovascular nodularity with more confluent airspace opacities dependently in both lower lobes. In addition, there are multiple enlarging, predominately right-sided solid pulmonary nodules, including a right middle lobe nodule measuring 1.8 cm on image 101/4 and right lower lobe nodules measuring 1.5 cm on image 86/4 and 4.9 x 2.0 cm on image 103/4. There is a new subpleural air cyst anteriorly in the  right upper lobe which measures up to 4.2 x 2.1 cm on image 66/4. This has slightly thickened walls, but no significant solid components. Upper abdomen: No acute findings are seen in the visualized upper abdomen. The gallbladder is mildly distended without wall thickening or surrounding inflammation. Nonobstructing right renal calculus and grossly stable bilateral renal cysts for which no specific follow-up imaging is recommended. The adrenal glands appear unchanged. Musculoskeletal/Chest wall: There is no chest wall mass or suspicious osseous finding. Minimal spondylosis for age. Healed posterior rib fractures on the right. IMPRESSION: 1. Significantly progressive diffuse peribronchovascular nodularity with more confluent airspace opacities dependently in both lower lobes compared with previous CT of 11 months ago. Findings are most consistent with progressive atypical infection, including fungal and atypical mycobacterial infection. Differential includes organizing pneumonia. Consider bronchoscopy. 2. Multiple enlarging, predominately right-sided solid pulmonary nodules, likely part of the same process. However, given the size of the nodules, malignancy not excluded. 3. New complex dependent small right greater than left pleural effusions. 4. New subpleural air cyst anteriorly in the right upper lobe with slightly thickened walls, but no significant solid components  or pneumothorax. 5. Stable central enlargement of the pulmonary arteries consistent with pulmonary arterial hypertension. 6.  Aortic Atherosclerosis (ICD10-I70.0). Electronically Signed   By: Elsie Perone M.D.   On: 07/16/2024 13:42   DG Chest Port 1 View Result Date: 07/16/2024 EXAM: 1 VIEW(S) XRAY OF THE CHEST 07/16/2024 11:11:45 AM COMPARISON: 07/28/2023 CLINICAL HISTORY: 89 year old male. Evaluation for pneumonia. History of COPD and acute cough. FINDINGS: LUNGS AND PLEURA: Mildly low lung volumes, stable. Chronic pulmonary interstitial opacities with bilateral subpleural scarring, as demonstrated on 2024 CT. No acute lung opacity. Stable Elevated right hemidiaphragm. No pleural effusion. No pneumothorax. HEART AND MEDIASTINUM: Aortic arch calcifications noted. No acute abnormality of the cardiac and mediastinal silhouettes. BONES AND SOFT TISSUES: No acute osseous abnormality. IMPRESSION: 1. No acute cardiopulmonary abnormality. Electronically signed by: Helayne Hurst MD 07/16/2024 11:39 AM EST RP Workstation: [id number]   CT HEAD WO CONTRAST ( ) Result Date: 07/16/2024 EXAM: CT HEAD WITHOUT CONTRAST 07/16/2024 11:31:45 AM TECHNIQUE: CT of the head was performed without the administration of intravenous contrast. Automated exposure control, iterative reconstruction, and/or weight based adjustment of the mA/kV was utilized to reduce the radiation dose to as low as reasonably achievable. COMPARISON: Brain MRI 04/11/2023. Head CT 04/13/2023. CLINICAL HISTORY: 89 year old male. Evaluation for intracranial hemorrhage. History of CVA, reported syncope vs seizure at facility. FINDINGS: BRAIN AND VENTRICLES: No acute hemorrhage. No evidence of acute infarct. No hydrocephalus. No extra-axial collection. No mass effect or midline shift. Stable brain volume. Chronic periventricular white matter hypodensity, more pronounced in the left hemisphere and at the left corona radiata. Similar chronic asymmetry in the  basal ganglia, greater on the left. Stable gray-white differentiation. Calcified atherosclerosis at the skull base. No suspicious intracranial vascular hyperdensity. ORBITS: No acute abnormality. SINUSES: Visible paranasal sinuses, middle ears, and mastoids remain well aerated. SOFT TISSUES AND SKULL: No acute soft tissue abnormality. No skull fracture. IMPRESSION: 1. No acute intracranial abnormality. 2. Stable non-contrast CT appearance of chronic small vessel disease, more pronounced in the left hemisphere. Electronically signed by: Helayne Hurst MD 07/16/2024 11:37 AM EST RP Workstation: [id number]               LOS: 1 day   Sarinity Dicicco  [facility]   Pager on [email or web address]. If 7PM-7AM, please contact night-coverage at [email or web address]     07/17/2024, 11:59 AM

## 2024-07-17 NOTE — Plan of Care (Signed)

## 2024-07-17 NOTE — Evaluation (Signed)
 Clinical/Bedside Swallow Evaluation Patient Details  Name: Ryan Pace MRN: [id number] Date of Birth: 02-16-1929  Today's Date: 07/17/2024 Time: SLP Start Time (ACUTE ONLY): 1115 SLP Stop Time (ACUTE ONLY): 1215 SLP Time Calculation (min) (ACUTE ONLY): 60 min  Past Medical History:  Past Medical History:  Diagnosis Date   Anemia    Basal cell carcinoma    Dysrhythmia    atrial fibrillation   Headache    Hyperlipidemia    Kidney stones    Prostatitis    Stroke [facility])    Past Surgical History:  Past Surgical History:  Procedure Laterality Date   APPENDECTOMY     CARDIAC CATHETERIZATION     CATARACT EXTRACTION, BILATERAL     COLONOSCOPY     1998, 2003, 2011   DUPUYTREN CONTRACTURE RELEASE Bilateral    DUPUYTREN CONTRACTURE RELEASE Right 01/11/2016   Procedure: DUPUYTREN CONTRACTURE RELEASE;  Surgeon: Kayla Pinal, MD;  Location: [facility] ORS;  Service: Orthopedics;  Laterality: Right;   FLEXIBLE SIGMOIDOSCOPY     1993   GREEN LIGHT LASER TURP (TRANSURETHRAL RESECTION OF PROSTATE N/A 02/15/2015   Procedure: GREEN LIGHT LASER TURP (TRANSURETHRAL RESECTION OF PROSTATE;  Surgeon: Ozell JONELLE Burkes, MD;  Location: [facility] ORS;  Service: Urology;  Laterality: N/A;   TONSILLECTOMY     TOTAL KNEE ARTHROPLASTY Left 10/29/2018   Procedure: TOTAL KNEE ARTHROPLASTY-LEFT;  Surgeon: Leora Lynwood JONELLE, MD;  Location: [facility] ORS;  Service: Orthopedics;  Laterality: Left;   HPI:  Pt is a 89 y.o. male  with medical history significant of PAF, history of stroke, COPD, chronic kidney disease stage IIIb, coronary artery disease, essential hypertension, obstructive sleep apnea, severe debility with bedbound status, who was brought to the hospital from [place] [facility] facility because of a syncopal episode.  He is bedbound at baseline.  He was found to be hypoxic with oxygen saturation of 84%.  He was admitted to the hospital for COPD exacerbation and pneumonia complicated by acute on chronic hypoxic  respiratory failure.    Chest CT: 1. Significantly progressive diffuse peribronchovascular nodularity  with more confluent airspace opacities dependently in both lower  lobes compared with previous CT of 11 months ago. Findings are most  consistent with progressive atypical infection, including fungal and  atypical mycobacterial infection. Differential includes organizing  pneumonia. Consider bronchoscopy.  2. Multiple enlarging, predominately right-sided solid pulmonary  nodules, likely part of the same process. However, given the size of  the nodules, malignancy not excluded.  3. New complex dependent small right greater than left pleural  effusions.  4. New subpleural air cyst anteriorly in the right upper lobe with  slightly thickened walls, but no significant solid components or  pneumothorax.  5. Stable central enlargement of the pulmonary arteries consistent  with pulmonary arterial hypertension.   OF NOTE: Pt has a Baseline of oropharyngeal phase dysphagia w/ aspiration of thin liquids in 07/2023 per MBSS.  Pt endorsed he is NOT using the Chin Tuck strategy recommended to lessen risk for aspiration of thin liquids.    Assessment / Plan / Recommendation  Clinical Impression   Pt seen for BSE today. Pt awake, verbally responded but required min verbal cues. Pt was A/O x2; NSG addressing his IVs. Noted mild UE tremors. Cues given for follow through w/ tasks.   On [place] O2 2L; afebrile. WBC elevated.  OF NOTE: pt had a MBSS in 07/2023: Pt presents with a mild oropharyngeal dysphagia. Pt with mildly prolonged mastication of solids likely due to  dental status. Pt with before the swallow Inconsistently, audible Aspiration with thin liquids which was reduced to before/during the swallow laryngeal penetration when a chin tuck was utilized. A cued cough with inconsitently effective in clearing penetration. Trace pharyngeal stasis appreciated. Pharyngeal deficits due to decreased base of tongue retration, reduced  hyolaryngeal elevation/excursion, mistimed laryngeal vestibule closure, and reduced pharyngeal and laryngeal sensation.  Pt appears to present w/ oropharyngeal phase Dysphagia as evidenced by results of this BSE and his previous MBSS (07/2023). Suspect sensorimotor deficits (noted) which resulted in subtle and overt s/s of aspiration as well as delay in oral clearing; this necessitated further swallowing strategies(chin tuck during swallowing of Nectar liquids).  Pt appears at risk for aspiration/aspiration pneumonia in setting of his oropharyngeal phase Dysphagia. The risk can be reduced somewhat by following aspiration precautions, following swallowing strategy of chin tuck(Baseline), and using a modified diet consistency; 100% Supervision is indicated for monitoring oral intake and follow through w/ swallowing strategies/precautions. Pt has challenging factors that could impact oropharyngeal swallowing to include: baseline Dysphagia w/ h/o aspiration of thin liquids; Advanced Age; Deconditioning and bedbound at baseline; support at meals for sitting up and tray setup. These factors can increase risk for aspiration, dysphagia as well as decreased oral intake overall.    During po trials, pt consumed consistencies w/ overt coughing w/ trials of thin liquids despite strategies/precautions. W/ trials of Nectar liquids USING A CHIN TUCK STRATEGY, no immediate/overt s/s of aspiration noted- no coughing, no decline in vocal quality, nor change in respiratory presentation during/post trials. O2 sats remained in 97% during. Oral phase c/b grossly functional bolus management w/ Nectar liquids and purees; min increased oral phase time and Time for oral clearing noted w/ increased texture of solids but w/ moistening Small bites, oral phase and mastication were grossly WFL. Control of bolus propulsion for A-P transfer for swallowing functional; trace lingual residue noted but Alternating a moist puree w/ solid aided in  oral clearing.  OM Exam appeared to reveal generalized weakness but no unilateral weakness.   Pt helped to feed self by holding Cup to drink w/ setup.    Recommend a Dysphagia level 2 (minced consistency) diet w/ well-moistened foods; Nectar liquids VIA CUP -- recommend using a Chin Tuck when swallowing Nectar liquids. Consider using a Dysphagia Drink Cup for improved bolus (volume) control. Pt should always help Hold Cup when drinking. Recommend aspiration precautions, including reducing talking/distractions at meals and small/single bites moistened well and/or alternated w/ a puree. MUST sit fully upright for po intake. Pills CRUSHED in Puree for safer, easier swallowing -- recommended now and for D/C. Tray setup. Education given on Pills in Puree; food consistencies and easy to eat options; aspiration precautions; Dysphagia Drink Cup to pt. NSG updated, agreed. MD updated. Recommend Dietician and Palliative Care f/u for support. Precautions posted in room, chart.  MBSS TBD while admitted. ST services will f/u w/ pt's status and GOC/POC while admitted.  SLP Visit Diagnosis: Dysphagia, oropharyngeal phase (R13.12) (baseline Dysphagia w/ h/o aspiration of thin liquids; Advanced Age; Deconditioning and bedbound at baseline; support at meals for sitting up and tray setup)    Aspiration Risk  Moderate aspiration risk;Risk for inadequate nutrition/hydration    Diet Recommendation   Nectar;Dysphagia 2 (chopped) (gravies to moisten) = a Dysphagia level 2 (minced consistency) diet w/ well-moistened foods; Nectar liquids VIA CUP -- recommend using a Chin Tuck when swallowing Nectar liquids. Consider using a Dysphagia Drink Cup for improved bolus (volume) control. Pt  should always help Hold Cup when drinking. Recommend aspiration precautions, including reducing talking/distractions at meals and small/single bites moistened well and/or alternated w/ a puree. MUST sit fully upright for po intake. Tray  setup.  Medication Administration: Whole meds with puree (vs need to Crush in puree)    Other  Recommendations Recommended Consults:  (Palliative Care consult for GOC; Dietician) Oral Care Recommendations: Oral care BID;Oral care before and after PO;Patient independent with oral care;Staff/trained caregiver to provide oral care Caregiver Recommendations: Avoid jello, ice cream, thin soups, popsicles;Remove water pitcher;Have oral suction available     Assistance Recommended at Discharge  FULL at meals for support  Functional Status Assessment Patient has had a recent decline in their functional status and/or demonstrates limited ability to make significant improvements in function in a reasonable and predictable amount of time  Frequency and Duration min 1 x/week  1 week       Prognosis Prognosis for improved oropharyngeal function: Guarded Barriers to Reach Goals: Cognitive deficits;Time post onset;Severity of deficits Barriers/Prognosis Comment: baseline Dysphagia w/ h/o aspiration of thin liquids; Advanced Age; Deconditioning and bedbound at baseline; support at meals for sitting up and tray setup      Swallow Study   General Date of Onset: 07/16/24 HPI: Pt is a 89 y.o. male  with medical history significant of PAF, history of stroke, COPD, chronic kidney disease stage IIIb, coronary artery disease, essential hypertension, obstructive sleep apnea, severe debility with bedbound status, who was brought to the hospital from [facility] facility because of a syncopal episode.  He is bedbound at baseline.  He was found to be hypoxic with oxygen saturation of 84%.  He was admitted to the hospital for COPD exacerbation and pneumonia complicated by acute on chronic hypoxic respiratory failure.    Chest CT: 1. Significantly progressive diffuse peribronchovascular nodularity  with more confluent airspace opacities dependently in both lower  lobes compared with previous CT of 11 months  ago. Findings are most  consistent with progressive atypical infection, including fungal and  atypical mycobacterial infection. Differential includes organizing  pneumonia. Consider bronchoscopy.  2. Multiple enlarging, predominately right-sided solid pulmonary  nodules, likely part of the same process. However, given the size of  the nodules, malignancy not excluded.  3. New complex dependent small right greater than left pleural  effusions.  4. New subpleural air cyst anteriorly in the right upper lobe with  slightly thickened walls, but no significant solid components or  pneumothorax.  5. Stable central enlargement of the pulmonary arteries consistent  with pulmonary arterial hypertension.   OF NOTE: Pt has a Baseline of oropharyngeal phase dysphagia w/ aspiration of thin liquids in 07/2023 per MBSS.  Pt endorsed he is NOT using the Chin Tuck strategy recommended to lessen risk for aspiration of thin liquids. Type of Study: Bedside Swallow Evaluation Previous Swallow Assessment: Pt seen for MBSS 07/2023: Pt presents with a mild oropharyngeal dysphagia. Pt with mildly prolonged mastication of solids likely due to dental status. Pt with before the swallow Inconsistently, audible Aspiration with thin liquids which was reduced to before/during the swallow laryngeal penetration when a chin tuck was utilized. A cued cough with inconsitently effective in clearing penetration. Trace pharyngeal stasis appreciated. Pharyngeal deficits due to decreased base of tongue retration, reduced hyolaryngeal elevation/excursion, mistimed laryngeal vestibule closure, and reduced pharyngeal and laryngeal sensation. Diet Prior to this Study: NPO Temperature Spikes Noted: No (wbc 17.4) Respiratory Status: Nasal cannula (2L) History of Recent Intubation: No Behavior/Cognition: Alert;Cooperative;Pleasant  mood;Distractible;Requires cueing Oral Cavity Assessment: Dry Oral Care Completed by SLP: Yes Oral Cavity - Dentition:  Adequate natural dentition;Missing dentition (few) Vision: Functional for self-feeding Self-Feeding Abilities: Able to feed self;Needs assist;Needs set up Patient Positioning: Upright in bed (full assist) Baseline Vocal Quality: Normal (gravely) Volitional Cough: Strong;Congested Volitional Swallow: Able to elicit    Oral/Motor/Sensory Function Overall Oral Motor/Sensory Function: Within functional limits   Ice Chips Ice chips: Within functional limits Presentation: Spoon (fed; 2 trials)   Thin Liquid Thin Liquid: Impaired Presentation: Cup;Self Fed (3 trials) Oral Phase Impairments: Poor awareness of bolus (min) Pharyngeal  Phase Impairments: Suspected delayed Swallow;Cough - Immediate;Cough - Delayed (2/3 trials)    Nectar Thick Nectar Thick Liquid: Impaired Presentation: Cup;Self Fed (~3 ozs) Oral Phase Impairments: Poor awareness of bolus (min) Pharyngeal Phase Impairments: Suspected delayed Swallow;Cough - Immediate (x2 w/out Chin tuck position) Other Comments: implemented the chin tuck   Honey Thick Honey Thick Liquid: Not tested   Puree Puree: Within functional limits Presentation: Spoon (fed; 10 trials)   Solid     Solid: Impaired (min) Presentation: Spoon (fed; 6 trials) Oral Phase Impairments: Poor awareness of bolus (min) Oral Phase Functional Implications: Oral residue (slight+) Pharyngeal Phase Impairments:  (no overt) Other Comments: alternated foods to aid clearing        Comer Portugal, MS, CCC-SLP Speech Language Pathologist Rehab Services; [facility] [phone] (ascom) Adolf Ormiston 07/17/2024,1:18 PM

## 2024-07-18 DIAGNOSIS — R918 Other nonspecific abnormal finding of lung field: Secondary | ICD-10-CM | POA: Diagnosis not present

## 2024-07-18 DIAGNOSIS — R55 Syncope and collapse: Secondary | ICD-10-CM | POA: Diagnosis not present

## 2024-07-18 DIAGNOSIS — J9601 Acute respiratory failure with hypoxia: Secondary | ICD-10-CM | POA: Diagnosis not present

## 2024-07-18 LAB — BASIC METABOLIC PANEL WITH GFR
Anion gap: 8 (ref 5–15)
BUN: 43 mg/dL — ABNORMAL HIGH (ref 8–23)
CO2: 33 mmol/L — ABNORMAL HIGH (ref 22–32)
Calcium: 8.9 mg/dL (ref 8.9–10.3)
Chloride: 104 mmol/L (ref 98–111)
Creatinine, Ser: 1.6 mg/dL — ABNORMAL HIGH (ref 0.61–1.24)
GFR, Estimated: 40 mL/min — ABNORMAL LOW (ref >60)
Glucose, Bld: 77 mg/dL (ref 70–99)
Potassium: 3.8 mmol/L (ref 3.5–5.1)
Sodium: 145 mmol/L (ref 135–145)

## 2024-07-18 LAB — LACTIC ACID, PLASMA: Lactic Acid, Venous: 1.2 mmol/L (ref 0.5–1.9)

## 2024-07-18 LAB — CBC
HCT: 34.1 % — ABNORMAL LOW (ref 39.0–52.0)
Hemoglobin: 11.1 g/dL — ABNORMAL LOW (ref 13.0–17.0)
MCH: 28.5 pg (ref 26.0–34.0)
MCHC: 32.6 g/dL (ref 30.0–36.0)
MCV: 87.7 fL (ref 80.0–100.0)
Platelets: 200 K/uL (ref 150–400)
RBC: 3.89 MIL/uL — ABNORMAL LOW (ref 4.22–5.81)
RDW: 13.8 % (ref 11.5–15.5)
WBC: 15.4 K/uL — ABNORMAL HIGH (ref 4.0–10.5)
nRBC: 0 % (ref 0.0–0.2)

## 2024-07-18 LAB — GLUCOSE, CAPILLARY: Glucose-Capillary: 84 mg/dL (ref 70–99)

## 2024-07-18 MED ORDER — IPRATROPIUM-ALBUTEROL 0.5-2.5 (3) MG/3ML IN SOLN
3.0000 mL | Freq: Three times a day (TID) | RESPIRATORY_TRACT | Status: DC
  Administered 2024-07-18 – 2024-07-19 (×4): 3 mL via RESPIRATORY_TRACT
  Filled 2024-07-18 (×4): qty 3

## 2024-07-18 MED ORDER — SODIUM CHLORIDE 0.9 % IV SOLN
2.0000 g | INTRAVENOUS | Status: DC
  Administered 2024-07-18 – 2024-07-20 (×3): 2 g via INTRAVENOUS
  Filled 2024-07-18 (×4): qty 20

## 2024-07-18 NOTE — Plan of Care (Signed)
  Problem: Education: Goal: Knowledge of General Education information will improve Description: Including pain rating scale, medication(s)/side effects and non-pharmacologic comfort measures Outcome: Progressing   Problem: Clinical Measurements: Goal: Diagnostic test results will improve Outcome: Progressing Goal: Cardiovascular complication will be avoided Outcome: Progressing   Problem: Activity: Goal: Risk for activity intolerance will decrease Outcome: Progressing   Problem: Elimination: Goal: Will not experience complications related to bowel motility Outcome: Progressing Goal: Will not experience complications related to urinary retention Outcome: Progressing

## 2024-07-18 NOTE — Plan of Care (Signed)
  Problem: Clinical Measurements: Goal: Ability to maintain clinical measurements within normal limits will improve Outcome: Progressing   Problem: Activity: Goal: Risk for activity intolerance will decrease Outcome: Progressing   Problem: Safety: Goal: Ability to remain free from injury will improve Outcome: Progressing   Problem: Pain Managment: Goal: General experience of comfort will improve and/or be controlled Outcome: Progressing   Problem: Skin Integrity: Goal: Risk for impaired skin integrity will decrease Outcome: Progressing

## 2024-07-18 NOTE — Progress Notes (Signed)
 Progress Note    Ryan Pace  FMW:[id number] DOB: 1929/06/22  DOA: 07/16/2024 PCP: Ryan Pace Ryan Pace      Brief Narrative:    Medical records reviewed and are as summarized below:  Ryan Pace is a 89 y.o. male  with medical history significant of PAF, history of stroke, COPD, chronic kidney disease stage IIIb, coronary artery disease, essential hypertension, obstructive sleep apnea, severe debility with bedbound status, who was brought to the hospital from [place] long-term [facility] facility because of a syncopal episode.  He is bedbound at baseline.  Apparently, he had 2 syncopal episodes prior to admission.  He was found to be hypoxic with oxygen saturation of 84%.  He was admitted to the hospital for COPD exacerbation and pneumonia complicated by acute on chronic hypoxic respiratory failure.  CT chest with contrast IMPRESSION: 1. Significantly progressive diffuse peribronchovascular nodularity with more confluent airspace opacities dependently in both lower lobes compared with previous CT of 11 months ago. Findings are most consistent with progressive atypical infection, including fungal and atypical mycobacterial infection. Differential includes organizing pneumonia. Consider bronchoscopy. 2. Multiple enlarging, predominately right-sided solid pulmonary nodules, likely part of the same process. However, given the size of the nodules, malignancy not excluded. 3. New complex dependent small right greater than left pleural effusions. 4. New subpleural air cyst anteriorly in the right upper lobe with slightly thickened walls, but no significant solid components or pneumothorax. 5. Stable central enlargement of the pulmonary arteries consistent with pulmonary arterial hypertension. 6.  Aortic Atherosclerosis (ICD10-I70.0).   Assessment/Plan:   Principal Problem:   Syncope and collapse Active Problems:   Acute renal failure superimposed on stage 3b  chronic kidney disease (HCC)   Paroxysmal A-fib (HCC)   Essential hypertension   CAD (coronary artery disease), native coronary artery   Acute hypoxemic respiratory failure (HCC)   COPD with acute exacerbation (HCC)   COPD exacerbation (HCC)    Body mass index is 30.05 kg/m.   S/p syncope: Probably due to hypoxia.   Sinus tachycardia: Heart rate was elevated on standing.  Check orthostatic vital signs.   COPD exacerbation: Continue antibiotics, prednisone  and bronchodilators.   Community-acquired pneumonia, atypical infection: Leukocytosis improving.  Significantly progressive diffuse peribronchovascular nodularity with more confluent airspace opacities in both lower lobes on CT chest.  Continue azithromycin . Consulted Ryan Pace, pulmonologist, to assist with management.   Chronic hypoxic respiratory failure: Patient reports that he uses 4 L of oxygen at home.  He will be maintained on 4 L oxygen at this time.   AKI on CKD stage IIIb: Creatinine is better.   Dysphagia: Speech therapist recommended dysphagia 2 diet and nectar thick liquids   Paroxysmal atrial fibrillation, history of stroke: Continue Eliquis    Chronic diastolic CHF: Compensated 2D echo in August 2024 showed EF estimated at 60 to 65%, grade 1 diastolic dysfunction.   Diet Order             DIET DYS 2 Room service appropriate? Yes with Assist; Fluid consistency: Nectar Thick  Diet effective now                                  Consultants: Pulmonologist  Procedures: None    Medications:    apixaban   2.5 mg Oral BID   feeding supplement (NEPRO CARB STEADY)  237 mL Oral BID BM   ipratropium-albuterol   3 mL Nebulization TID  predniSONE   40 mg Oral Q breakfast   sertraline   12.5 mg Oral QHS   sertraline   50 mg Oral Daily   traZODone   200 mg Oral QHS   Continuous Infusions:     Anti-infectives (From admission, onward)    Start     Dose/Rate Route Frequency  Ordered Stop   07/17/24 1000  azithromycin  (ZITHROMAX ) 500 mg in sodium chloride  0.9 % 250 mL IVPB        500 mg 250 mL/hr over 60 Minutes Intravenous Daily 07/17/24 0759 07/18/24 1145   07/16/24 1115  cefTRIAXone  (ROCEPHIN ) 2 g in sodium chloride  0.9 % 100 mL IVPB        2 g 200 mL/hr over 30 Minutes Intravenous  Once 07/16/24 1113 07/16/24 1149   07/16/24 1115  azithromycin  (ZITHROMAX ) 500 mg in sodium chloride  0.9 % 250 mL IVPB        500 mg 250 mL/hr over 60 Minutes Intravenous  Once 07/16/24 1113 07/16/24 1318              Family Communication/Anticipated D/C date and plan/Code Status   DVT prophylaxis: apixaban  (ELIQUIS ) tablet 2.5 mg Start: 07/16/24 2200 SCDs Start: 07/16/24 1224 apixaban  (ELIQUIS ) tablet 2.5 mg     Code Status: Limited: Do not attempt resuscitation (DNR) -DNR-LIMITED -Do Not Intubate/DNI   Family Communication: Plan discussed with Acupuncturist, son, at the bedside Disposition Plan: Plan to discharge to long-term care facility   Status is: Inpatient Remains inpatient appropriate because: COPD exacerbation, pneumonia       Subjective:   Interval events noted.  He complains of cough.  He feels a little better.  Nursing staff reported patient became tachycardic when he stood up.  Reportedly, heart rate went up to 200 and sats were down to about 125.  Oxygen saturation was initially 86% on 2 L and oxygen was increased to 4 L with saturation going up to 92 to 93%.  Ryan Pace, son, was at the bedside  Objective:    Vitals:   07/17/24 1938 07/17/24 2113 07/18/24 0433 07/18/24 0803  BP: 125/79  (!) 157/89 (!) 147/95  Pulse: (!) 103  96 96  Resp: 16   20  Temp: (!) 97.4 F (36.3 C) 97.6 F (36.4 C) (!) 97.5 F (36.4 C) 98 F (36.7 C)  TempSrc: Oral Oral    SpO2: 95% 94% 97% (!) 87%  Height:       No data found.   Intake/Output Summary (Last 24 hours) at 07/18/2024 1326 Last data filed at 07/18/2024 1145 Gross per 24 hour  Intake 620 ml  Output  --  Net 620 ml   There were no vitals filed for this visit.  Exam:  GEN: NAD SKIN: Warm and dry EYES: No pallor or icterus ENT: MMM CV: RRR PULM: Bilateral rales.  No wheezing heard ABD: soft, ND, NT, +BS CNS: AAO x 3, non focal EXT: No edema or tenderness       Data Reviewed:   I have personally reviewed following labs and imaging studies:  Labs: Labs show the following:   Basic Metabolic Panel: Recent Labs  Lab 07/16/24 1041 07/17/24 0348 07/18/24 0605  NA 142 145 145  K 4.1 4.5 3.8  CL 97* 101 104  CO2 33* 33* 33*  GLUCOSE 107* 125* 77  BUN 44* 47* 43*  CREATININE 2.04* 2.04* 1.60*  CALCIUM  9.7 9.1 8.9   GFR CrCl cannot be calculated (Unknown ideal weight.). Liver Function Tests: Recent Labs  Lab 07/16/24 1041  AST 64*  ALT 44  ALKPHOS 214*  BILITOT 0.5  PROT 8.1  ALBUMIN 3.2*   No results for input(s): LIPASE, AMYLASE in the last 168 hours. No results for input(s): AMMONIA in the last 168 hours. Coagulation profile Recent Labs  Lab 07/16/24 1041  INR 1.3*    CBC: Recent Labs  Lab 07/16/24 1041 07/18/24 0605  WBC 17.4* 15.4*  NEUTROABS 15.3*  --   HGB 12.8* 11.1*  HCT 40.8 34.1*  MCV 89.7 87.7  PLT 223 200   Cardiac Enzymes: No results for input(s): CKTOTAL, CKMB, CKMBINDEX, TROPONINI in the last 168 hours. BNP (last 3 results) Recent Labs    07/16/24 1041  PROBNP 1,863.0*   CBG: Recent Labs  Lab 07/18/24 0845  GLUCAP 84   D-Dimer: No results for input(s): DDIMER in the last 72 hours. Hgb A1c: No results for input(s): HGBA1C in the last 72 hours. Lipid Profile: No results for input(s): CHOL, HDL, LDLCALC, TRIG, CHOLHDL, LDLDIRECT in the last 72 hours. Thyroid  function studies: No results for input(s): TSH, T4TOTAL, T3FREE, THYROIDAB in the last 72 hours.  Invalid input(s): FREET3 Anemia work up: No results for input(s): VITAMINB12, FOLATE, FERRITIN, TIBC, IRON,  RETICCTPCT in the last 72 hours. Sepsis Labs: Recent Labs  Lab 07/16/24 1041 07/16/24 1216 07/16/24 1932 07/18/24 0605  PROCALCITON  --   --  0.25  --   WBC 17.4*  --   --  15.4*  LATICACIDVEN 4.2* 3.4*  --  1.2    Microbiology Recent Results (from the past 240 hours)  Blood Culture (routine x 2)     Status: None (Preliminary result)   Collection Time: 07/16/24 10:41 AM   Specimen: BLOOD  Result Value Ref Range Status   Specimen Description BLOOD RIGHT ANTECUBITAL  Final   Special Requests   Final    BOTTLES DRAWN AEROBIC AND ANAEROBIC Blood Culture adequate volume   Culture   Final    NO GROWTH 2 DAYS Performed at [facility], [address], [place], [place] [address]    Report Status PENDING  Incomplete  Blood Culture (routine x 2)     Status: None (Preliminary result)   Collection Time: 07/16/24 11:00 AM   Specimen: BLOOD  Result Value Ref Range Status   Specimen Description BLOOD LEFT ANTECUBITAL  Final   Special Requests   Final    BOTTLES DRAWN AEROBIC AND ANAEROBIC Blood Culture results may not be optimal due to an inadequate volume of blood received in culture bottles   Culture   Final    NO GROWTH 2 DAYS Performed at [facility], [address] [place], [place] [address]    Report Status PENDING  Incomplete  MRSA Next Gen by PCR, Nasal     Status: Abnormal   Collection Time: 07/17/24  8:43 AM   Specimen: Nasal Mucosa; Nasal Swab  Result Value Ref Range Status   MRSA by PCR Next Gen DETECTED (A) NOT DETECTED Final    Comment: RESULT CALLED TO, READ BACK BY AND VERIFIED WITH: ODILIA FLOWERS, RN @2028  07/17/2024 COP (NOTE) The GeneXpert MRSA Assay (FDA approved for NASAL specimens only), is one component of a comprehensive MRSA colonization surveillance program. It is not intended to diagnose MRSA infection nor to guide or monitor treatment for MRSA infections. Test performance is not FDA approved in patients less than 7  years old. Performed at [facility], [address], [place], [place] [address]  Procedures and diagnostic studies:  No results found.              LOS: 2 days   Celestine Bougie  [facility]   Pager on [email or web address]. If 7PM-7AM, please contact night-coverage at [email or web address]     07/18/2024, 1:26 PM

## 2024-07-18 NOTE — Progress Notes (Signed)
 Mobility Specialist Progress Note:    07/18/24 1359  Mobility  Activity Stood at bedside;Pivoted/transferred from chair to bed  Level of Assistance Moderate assist, patient does 50-74%  Assistive Device  (2 HHA)  Activity Response Tolerated well  Mobility visit 1 Mobility  Mobility Specialist Start Time (ACUTE ONLY) 1345  Mobility Specialist Stop Time (ACUTE ONLY) 1352  Mobility Specialist Time Calculation (min) (ACUTE ONLY) 7 min   Assisted NT with transfer back to bed. Required Mod/MaxA to stand and transfer with 2 person hand held assist. Tolerated well, left pt with NT. All needs met.  Sherrilee Ditty Mobility Specialist Please contact via Special Educational Needs Teacher or  Rehab office at [phone]

## 2024-07-18 NOTE — Progress Notes (Signed)
 Mobility Specialist - Progress Note    07/18/24 1300  Mobility  Activity Stood at bedside;Pivoted/transferred from bed to chair  Level of Assistance Moderate assist, patient does 50-74%  Assistive Device Front wheel walker  Distance Ambulated (ft) 4 ft  Range of Motion/Exercises Active  Activity Response Tolerated well  Mobility Referral Yes  Mobility visit 1 Mobility  Mobility Specialist Start Time (ACUTE ONLY) 1116   Pt resting in bed on 2L upon entry. Patient agreeable to participate in mobility. Pt's initial O2 sats were initially 86% in bed once head raised. Pt was elevated, O2 was increased to 4 LPM. Pt returned to 92 -93%. Pt STS ModA heavy (+2 for safety recommended). Pt's HR went above 200. After standing for a min, HR was 197 then dropped to 125. Pt left in recliner with needs in reach and chair alarm activated; HR 150. O2 Sats are now 92 -93% on 2LPM O2.   Guido Rumble Mobility Specialist 07/18/24, 1:08 PM

## 2024-07-18 NOTE — Consult Note (Signed)
   NAME:  Ryan Pace, MRN:  [id number], DOB:  07-Apr-1929, LOS: 2 ADMISSION DATE:  07/16/2024, CONSULTATION DATE:  07/18/2024 REFERRING MD:  Aida Cho, CHIEF COMPLAINT:  Aspiration Pneumonia   History of Present Illness:  Case of a 89 year old male patient with a past medical history of CVA complicated by recurrent aspiration events and dysphagia presenting to [facility] on 11/20 with 2 episodes of syncope.  Was found to be in acute hypoxic respiratory failure requiring 3 to 4 L nasal cannula.  History was taken mainly per patient's son.  It seems like after his stroke he started choking frequently on food and developing multiple episodes of pneumonia.  Labs on arrival with an elevated white count at 17.4 with a left shift.  Lactic acid of 4.2.  AKI with creatinine 2.04 mg/dL from a baseline of 1.6 mg/dL.  He was admitted and started on antibiotics.  He did improve white count is trending down.  However CT chest was obtained shows bibasilar consolidative opacity mostly pronounced in the right lower lobe.  Along with multiple enlarging right sided solid pulmonary nodules.  CT chest obtained a year ago showed subpleural reticulations with smaller solid pulmonary nodules around the same area.  Pulmonary team has been consulted for help with further management. Pertinent  Medical History  As above  Interim History / Subjective:  Patient appears comfortable sitting in chair.  Without any distress.  On 2 L nasal cannula.  Objective    Blood pressure (!) 147/95, pulse 96, temperature 98 F (36.7 C), resp. rate 20, height 5' 9 (1.753 m), SpO2 (!) 87%.        Intake/Output Summary (Last 24 hours) at 07/18/2024 1523 Last data filed at 07/18/2024 1145 Gross per 24 hour  Intake 370 ml  Output --  Net 370 ml   There were no vitals filed for this visit.  Examination: General: Elderly, frail HENT: Supple neck, reactive pupils Lungs: Rhonchi appreciated bilaterally Cardiovascular:  Normal S1, normal S2, regular rate and rhythm Abdomen: Soft nontender nondistended positive bowel sounds Extremities: Warm well-perfused no edema  Labs and imaging were reviewed  Assessment and Plan  Case of a 89 year old male patient with a past medical history of CVA complicated by recurrent aspiration events and dysphagia presenting to [facility] on 11/20 with 2 episodes of syncope.  Was found to be in acute hypoxic respiratory failure requiring 3 to 4 L nasal cannula.  Reviewing his CT scans there is certainly concern for chronic interstitial lung disease on the differential include MAC or atypical fungal infection with also underlying subpleural reticulation concerning for fibrotic changes.  However there is also confounding factor of recurrent aspiration in the setting of recent stroke.  Given his improvement in white count I would recommend continuing treating for aspiration pneumonia.  I added ceftriaxone  to his regimen and he should finish 7 days.  Continue working with doctor, general practice.  He will need a repeat CAT scan in 4 to 8 weeks.  I discussed the plan with his son who is in agreement.  Pulmonary team will continue to follow.  I personally spent a total of 80 minutes in the care of the patient today including preparing to see the patient, getting/reviewing separately obtained history, performing a medically appropriate exam/evaluation, counseling and educating, placing orders, documenting clinical information in the EHR, independently interpreting results, and communicating results.   Darrin Barn, MD [facility] 07/18/2024 3:32 PM

## 2024-07-19 DIAGNOSIS — R55 Syncope and collapse: Secondary | ICD-10-CM | POA: Diagnosis not present

## 2024-07-19 LAB — CBC
HCT: 37.1 % — ABNORMAL LOW (ref 39.0–52.0)
Hemoglobin: 11.8 g/dL — ABNORMAL LOW (ref 13.0–17.0)
MCH: 28.3 pg (ref 26.0–34.0)
MCHC: 31.8 g/dL (ref 30.0–36.0)
MCV: 89 fL (ref 80.0–100.0)
Platelets: 239 K/uL (ref 150–400)
RBC: 4.17 MIL/uL — ABNORMAL LOW (ref 4.22–5.81)
RDW: 14.1 % (ref 11.5–15.5)
WBC: 14.6 K/uL — ABNORMAL HIGH (ref 4.0–10.5)
nRBC: 0 % (ref 0.0–0.2)

## 2024-07-19 MED ORDER — IPRATROPIUM-ALBUTEROL 0.5-2.5 (3) MG/3ML IN SOLN
3.0000 mL | Freq: Two times a day (BID) | RESPIRATORY_TRACT | Status: DC
  Administered 2024-07-19 – 2024-07-22 (×6): 3 mL via RESPIRATORY_TRACT
  Filled 2024-07-19 (×6): qty 3

## 2024-07-19 NOTE — Progress Notes (Signed)
 Progress Note    Ryan Pace  FMW:[id number] DOB: 05-17-1929  DOA: 07/16/2024 PCP: Lenon Layman ORN, MD      Brief Narrative:    Medical records reviewed and are as summarized below:  Ryan Pace is a 89 y.o. male  with medical history significant of PAF, history of stroke, COPD, chronic kidney disease stage IIIb, coronary artery disease, essential hypertension, obstructive sleep apnea, severe debility with bedbound status, who was brought to the hospital from [facility] because of a syncopal episode.  He is bedbound at baseline.  Apparently, he had 2 syncopal episodes prior to admission.  He was found to be hypoxic with oxygen saturation of 84%.  He was admitted to the hospital for COPD exacerbation and pneumonia complicated by acute on chronic hypoxic respiratory failure.  CT chest with contrast IMPRESSION: 1. Significantly progressive diffuse peribronchovascular nodularity with more confluent airspace opacities dependently in both lower lobes compared with previous CT of 11 months ago. Findings are most consistent with progressive atypical infection, including fungal and atypical mycobacterial infection. Differential includes organizing pneumonia. Consider bronchoscopy. 2. Multiple enlarging, predominately right-sided solid pulmonary nodules, likely part of the same process. However, given the size of the nodules, malignancy not excluded. 3. New complex dependent small right greater than left pleural effusions. 4. New subpleural air cyst anteriorly in the right upper lobe with slightly thickened walls, but no significant solid components or pneumothorax. 5. Stable central enlargement of the pulmonary arteries consistent with pulmonary arterial hypertension. 6.  Aortic Atherosclerosis (ICD10-I70.0).   Assessment/Plan:   Principal Problem:   Syncope and collapse Active Problems:   Acute renal failure superimposed on stage 3b  chronic kidney disease (HCC)   Paroxysmal A-fib (HCC)   Essential hypertension   CAD (coronary artery disease), native coronary artery   Acute hypoxemic respiratory failure (HCC)   COPD with acute exacerbation (HCC)   COPD exacerbation (HCC)    Body mass index is 30.05 kg/m.   S/p syncope: Probably due to hypoxia.  Obtain 2D echo for further evaluation. Sinus tachycardia: Heart rate still goes up significantly with standing.  Unable to stand long enough for orthostatics per Ellouise, LPN.   COPD exacerbation: Continue antibiotics, prednisone  and bronchodilators.   Community-acquired pneumonia, atypical infection: Leukocytosis improving.  Significantly progressive diffuse peribronchovascular nodularity with more confluent airspace opacities in both lower lobes on CT chest. Appreciate recommendations from pulmonologist. Continue IV ceftriaxone  and azithromycin . Plan for repeat CT chest imaging in 4 to 8 weeks as an outpatient.   Chronic hypoxic respiratory failure: Patient reports that he uses 4 L of oxygen at home.  He will be maintained on 4 L oxygen at this time. [name], son, said that patient had been using oxygen since he was diagnosed with pneumonia about 3 weeks prior to admission.  However before that, he was not on oxygen.   AKI on CKD stage IIIb: Creatinine is better.   Dysphagia: Speech therapist downgraded diet from dysphagia 2 with nectar thick liquids to dysphagia 2 with honey thick liquids.     Paroxysmal atrial fibrillation, history of stroke: Continue Eliquis    Chronic diastolic CHF: Compensated 2D echo in August 2024 showed EF estimated at 60 to 65%, grade 1 diastolic dysfunction.   Plan of care was discussed with [name], son, over the phone.  Diagnoses, prognosis and goals of care were discussed.  He said that his dad has told him multiple times that he is ready to move on.  He said his quality of life is terrible and he does not want to continue living like  this.  We discussed the way forward.  We discussed hospice.  He is open to the idea of hospice and wants to discuss this with his brothers.  He would like to hear what hospice has to say.  Hospice consult has been ordered.   Diet Order             DIET DYS 2 Room service appropriate? Yes with Assist; Fluid consistency: Honey Thick  Diet effective now                                  Consultants: Pulmonologist  Procedures: None    Medications:    apixaban   2.5 mg Oral BID   feeding supplement (NEPRO CARB STEADY)  237 mL Oral BID BM   ipratropium-albuterol   3 mL Nebulization BID   predniSONE   40 mg Oral Q breakfast   sertraline   12.5 mg Oral QHS   sertraline   50 mg Oral Daily   traZODone   200 mg Oral QHS   Continuous Infusions:  cefTRIAXone  (ROCEPHIN )  IV 2 g (07/18/24 1620)      Anti-infectives (From admission, onward)    Start     Dose/Rate Route Frequency Ordered Stop   07/18/24 1615  cefTRIAXone  (ROCEPHIN ) 2 g in sodium chloride  0.9 % 100 mL IVPB        2 g 200 mL/hr over 30 Minutes Intravenous Every 24 hours 07/18/24 1528     07/17/24 1000  azithromycin  (ZITHROMAX ) 500 mg in sodium chloride  0.9 % 250 mL IVPB        500 mg 250 mL/hr over 60 Minutes Intravenous Daily 07/17/24 0759 07/18/24 1145   07/16/24 1115  cefTRIAXone  (ROCEPHIN ) 2 g in sodium chloride  0.9 % 100 mL IVPB        2 g 200 mL/hr over 30 Minutes Intravenous  Once 07/16/24 1113 07/16/24 1149   07/16/24 1115  azithromycin  (ZITHROMAX ) 500 mg in sodium chloride  0.9 % 250 mL IVPB        500 mg 250 mL/hr over 60 Minutes Intravenous  Once 07/16/24 1113 07/16/24 1318              Family Communication/Anticipated D/C date and plan/Code Status   DVT prophylaxis: apixaban  (ELIQUIS ) tablet 2.5 mg Start: 07/16/24 2200 SCDs Start: 07/16/24 1224 apixaban  (ELIQUIS ) tablet 2.5 mg     Code Status: Limited: Do not attempt resuscitation (DNR) -DNR-LIMITED -Do Not Intubate/DNI    Family Communication: Plan discussed with Acupuncturist, son, at the bedside Disposition Plan: Plan to discharge to long-term care facility   Status is: Inpatient Remains inpatient appropriate because: COPD exacerbation, pneumonia       Subjective:   Interval events noted.  He feels weak.  He still has a cough.  No shortness of breath, dizziness or chest pain.  He said that I am 89 and I am ready to go on .  He said he had a conversation with [name], his son, who visited today.  Objective:    Vitals:   07/19/24 0333 07/19/24 0830 07/19/24 0954 07/19/24 1605  BP:  127/83 127/72 (!) 153/91  Pulse:  (!) 110 72 (!) 112  Resp:  16  19  Temp: 97.6 F (36.4 C) 97.8 F (36.6 C)  97.6 F (36.4 C)  TempSrc: Oral Oral  Oral  SpO2: 94%  93% 94% 94%  Height:       Orthostatic VS for the past 24 hrs:  BP- Lying Pulse- Lying BP- Sitting Pulse- Sitting  07/19/24 0333 (!) 147/101 118 (!) 147/98 123     Intake/Output Summary (Last 24 hours) at 07/19/2024 1608 Last data filed at 07/19/2024 1300 Gross per 24 hour  Intake 480 ml  Output --  Net 480 ml   There were no vitals filed for this visit.  Exam:  GEN: NAD SKIN: Warm and dry EYES: No pallor or icterus ENT: MMM CV: RRR PULM: Bilateral rales, bilateral rhonchi ABD: soft, ND, NT, +BS CNS: AAO x 4, non focal EXT: No edema or tenderness        Data Reviewed:   I have personally reviewed following labs and imaging studies:  Labs: Labs show the following:   Basic Metabolic Panel: Recent Labs  Lab 07/16/24 1041 07/17/24 0348 07/18/24 0605  NA 142 145 145  K 4.1 4.5 3.8  CL 97* 101 104  CO2 33* 33* 33*  GLUCOSE 107* 125* 77  BUN 44* 47* 43*  CREATININE 2.04* 2.04* 1.60*  CALCIUM  9.7 9.1 8.9   GFR CrCl cannot be calculated (Unknown ideal weight.). Liver Function Tests: Recent Labs  Lab 07/16/24 1041  AST 64*  ALT 44  ALKPHOS 214*  BILITOT 0.5  PROT 8.1  ALBUMIN 3.2*   No results for input(s):  LIPASE, AMYLASE in the last 168 hours. No results for input(s): AMMONIA in the last 168 hours. Coagulation profile Recent Labs  Lab 07/16/24 1041  INR 1.3*    CBC: Recent Labs  Lab 07/16/24 1041 07/18/24 0605 07/19/24 0409  WBC 17.4* 15.4* 14.6*  NEUTROABS 15.3*  --   --   HGB 12.8* 11.1* 11.8*  HCT 40.8 34.1* 37.1*  MCV 89.7 87.7 89.0  PLT 223 200 239   Cardiac Enzymes: No results for input(s): CKTOTAL, CKMB, CKMBINDEX, TROPONINI in the last 168 hours. BNP (last 3 results) Recent Labs    07/16/24 1041  PROBNP 1,863.0*   CBG: Recent Labs  Lab 07/18/24 0845  GLUCAP 84   D-Dimer: No results for input(s): DDIMER in the last 72 hours. Hgb A1c: No results for input(s): HGBA1C in the last 72 hours. Lipid Profile: No results for input(s): CHOL, HDL, LDLCALC, TRIG, CHOLHDL, LDLDIRECT in the last 72 hours. Thyroid  function studies: No results for input(s): TSH, T4TOTAL, T3FREE, THYROIDAB in the last 72 hours.  Invalid input(s): FREET3 Anemia work up: No results for input(s): VITAMINB12, FOLATE, FERRITIN, TIBC, IRON, RETICCTPCT in the last 72 hours. Sepsis Labs: Recent Labs  Lab 07/16/24 1041 07/16/24 1216 07/16/24 1932 07/18/24 0605 07/19/24 0409  PROCALCITON  --   --  0.25  --   --   WBC 17.4*  --   --  15.4* 14.6*  LATICACIDVEN 4.2* 3.4*  --  1.2  --     Microbiology Recent Results (from the past 240 hours)  Blood Culture (routine x 2)     Status: None (Preliminary result)   Collection Time: 07/16/24 10:41 AM   Specimen: BLOOD  Result Value Ref Range Status   Specimen Description BLOOD RIGHT ANTECUBITAL  Final   Special Requests   Final    BOTTLES DRAWN AEROBIC AND ANAEROBIC Blood Culture adequate volume   Culture   Final    NO GROWTH 3 DAYS Performed at [facility], [address], [place], [place] [address]    Report Status PENDING  Incomplete  Blood  Culture (routine x 2)     Status:  None (Preliminary result)   Collection Time: 07/16/24 11:00 AM   Specimen: BLOOD  Result Value Ref Range Status   Specimen Description BLOOD LEFT ANTECUBITAL  Final   Special Requests   Final    BOTTLES DRAWN AEROBIC AND ANAEROBIC Blood Culture results may not be optimal due to an inadequate volume of blood received in culture bottles   Culture   Final    NO GROWTH 3 DAYS Performed at [facility], [address] [place], [place] [address]    Report Status PENDING  Incomplete  MRSA Next Gen by PCR, Nasal     Status: Abnormal   Collection Time: 07/17/24  8:43 AM   Specimen: Nasal Mucosa; Nasal Swab  Result Value Ref Range Status   MRSA by PCR Next Gen DETECTED (A) NOT DETECTED Final    Comment: RESULT CALLED TO, READ BACK BY AND VERIFIED WITH: OSASOGIE ODEH, RN @2028  07/17/2024 COP (NOTE) The GeneXpert MRSA Assay (FDA approved for NASAL specimens only), is one component of a comprehensive MRSA colonization surveillance program. It is not intended to diagnose MRSA infection nor to guide or monitor treatment for MRSA infections. Test performance is not FDA approved in patients less than 75 years old. Performed at [facility], [address] [place], [place] [address]     Procedures and diagnostic studies:  No results found.              LOS: 3 days   Tenise Stetler  [facility]   Pager on [email or web address]. If 7PM-7AM, please contact night-coverage at [email or web address]     07/19/2024, 4:08 PM

## 2024-07-19 NOTE — Evaluation (Signed)
 Physical Therapy Evaluation Patient Details Name: Ryan Pace MRN: [id number] DOB: [birth year] Today's Date: 07/19/2024  History of Present Illness  89 year old male patient with a past medical history of CVA complicated by recurrent aspiration events and dysphagia presenting to [facility] on 11/20 with 2 episodes of syncope.  Was found to be in acute hypoxic respiratory failure requiring 3 to 4 L nasal cannula.  Clinical Impression  Patient noted to be in supine position at PT and OT arrival in room, for an initial PT evaluation due to a decline in functional status, with baseline mobility reported as wheelchair level, and currently requiring MaxA x 2 for transfers. The patient is A&O x 4, presenting with good willingness to work with PT and goals of return to [employer] and continuing PT. The patient resides in long term care at [facility] with family/friend support. HR was monitored and elevated with variable reliability from reading due to heart beat irregularity with an SpO? of ~90% on 2 L/min. The overall clinical impression is that the patient presents with moderate mobility limitations secondary to acute medical complication and wheelchair level at baseline. Recommended skilled PT will address safety, mobility, and discharge planning. PT recommendation to d/c patient to SNF upon medical clearance.        If plan is discharge home, recommend the following: A lot of help with walking and/or transfers;A lot of help with bathing/dressing/bathroom;Assist for transportation;Help with stairs or ramp for entrance   Can travel by private vehicle   No    Equipment Recommendations None recommended by PT  Recommendations for Other Services       Functional Status Assessment Patient has had a recent decline in their functional status and/or demonstrates limited ability to make significant improvements in function in a reasonable and predictable amount of time     Precautions / Restrictions  Precautions Precautions: Fall Restrictions Weight Bearing Restrictions Per Provider Order: No      Mobility  Bed Mobility Overal bed mobility: Needs Assistance Bed Mobility: Supine to Sit     Supine to sit: Mod assist, +2 for physical assistance, HOB elevated, Used rails, Max assist     General bed mobility comments: physical assistance provide to get to EOB; light assistance to legs and trunk; additional assistance provided to get feet to floor    Transfers Overall transfer level: Needs assistance Equipment used: Rolling walker (2 wheels) Transfers: Sit to/from Stand, Bed to chair/wheelchair/BSC Sit to Stand: Max assist, +2 physical assistance, From elevated surface, Mod assist Stand pivot transfers: Max assist, From elevated surface, +2 physical assistance, Mod assist         General transfer comment: required vc and reassurance to reduce anxiety and lower heart rate; pt able to sit to stand multiple times for orthostatics and hygiene dressing; Orthostatic measured and where negative    Ambulation/Gait                  Stairs            Wheelchair Mobility     Tilt Bed    Modified Rankin (Stroke Patients Only)       Balance Overall balance assessment: Needs assistance Sitting-balance support: Feet supported Sitting balance-Leahy Scale: Fair   Postural control: Left lateral lean Standing balance support: During functional activity, Reliant on assistive device for balance Standing balance-Leahy Scale: Fair  Pertinent Vitals/Pain Pain Assessment Pain Assessment: No/denies pain    Home Living Family/patient expects to be discharged to:: Skilled nursing facility                   Additional Comments: From [facility] assisted [facility]    Prior Function Prior Level of Function : Needs assist  Cognitive Assist : Mobility (cognitive) Mobility (Cognitive): Step by step cues   Physical Assist :  Mobility (physical) Mobility (physical): Bed mobility;Transfers;Gait;Stairs   Mobility Comments: modI with transfers to [facility] and can sometimes stand with assistance; son in room to confirm ADLs Comments: generally dependent for ADLs; assistance with bathing, dressing and showers     Extremity/Trunk Assessment   Upper Extremity Assessment Upper Extremity Assessment: Generalized weakness    Lower Extremity Assessment Lower Extremity Assessment: Generalized weakness    Cervical / Trunk Assessment Cervical / Trunk Assessment: Kyphotic  Communication   Communication Communication: No apparent difficulties Factors Affecting Communication: Hearing impaired    Cognition Arousal: Alert Behavior During Therapy: WFL for tasks assessed/performed, Anxious   PT - Cognitive impairments: No apparent impairments                         Following commands: Impaired Following commands impaired: Follows one step commands with increased time     Cueing Cueing Techniques: Verbal cues, Tactile cues     General Comments General comments (skin integrity, edema, etc.): RN in room to change skin barrier pad, pt on 2.5L via [place] >90% throughout    Exercises     Assessment/Plan    PT Assessment Patient needs continued PT services  PT Problem List Decreased strength;Decreased activity tolerance;Decreased mobility;Decreased balance;Decreased safety awareness       PT Treatment Interventions DME instruction;Gait training;Stair training;Functional mobility training;Therapeutic activities;Therapeutic exercise;Patient/family education;Neuromuscular re-education;Balance training    PT Goals (Current goals can be found in the Care Plan section)  Acute Rehab PT Goals Patient Stated Goal: Son wants pt.to retun to [facility] SNF for continued therapy PT Goal Formulation: With family Time For Goal Achievement: 08/09/24 Potential to Achieve Goals: Good    Frequency Min 2X/week      Co-evaluation PT/OT/SLP Co-Evaluation/Treatment: Yes Reason for Co-Treatment: Complexity of the patient's impairments (multi-system involvement);Necessary to address cognition/behavior during functional activity;For patient/therapist safety;To address functional/ADL transfers PT goals addressed during session: Mobility/safety with mobility;Strengthening/ROM;Balance OT goals addressed during session: ADL's and self-care       AM-PAC PT 6 Clicks Mobility  Outcome Measure Help needed turning from your back to your side while in a flat bed without using bedrails?: A Little Help needed moving from lying on your back to sitting on the side of a flat bed without using bedrails?: A Little Help needed moving to and from a bed to a chair (including a wheelchair)?: A Lot Help needed standing up from a chair using your arms (e.g., wheelchair or bedside chair)?: A Lot Help needed to walk in hospital room?: A Lot Help needed climbing 3-5 steps with a railing? : Total 6 Click Score: 13    End of Session Equipment Utilized During Treatment: Gait belt Activity Tolerance: Patient tolerated treatment well;Patient limited by fatigue;Other (comment) (limited by elevated HR) Patient left: in chair;with call bell/phone within reach;with chair alarm set;with family/visitor present Nurse Communication: Mobility status PT Visit Diagnosis: Other abnormalities of gait and mobility (R26.89);Muscle weakness (generalized) (M62.81);Difficulty in walking, not elsewhere classified (R26.2)    Time: 9058-8986 PT Time Calculation (min) (ACUTE ONLY):  32 min   Charges:   PT Evaluation $PT Eval Low Complexity: 1 Low   PT General Charges $$ ACUTE PT VISIT: 1 Visit         Sherlean Lesches DPT, PT    Sherlean A Yari Szeliga 07/19/2024, 11:34 AM

## 2024-07-19 NOTE — Progress Notes (Signed)
 Speech Language Pathology Treatment: Dysphagia  Patient Details Name: Ryan Pace MRN: [id number] DOB: 01/10/1929 Today's Date: 07/19/2024 Time: 8854-8774 SLP Time Calculation (min) (ACUTE ONLY): 40 min  Assessment / Plan / Recommendation Clinical Impression  Pt seen for ongoing assessment of swallowing today; pt and Staff endorse coughing when drinking the Nectar consistency liquids. Pt awake, verbally responded but required min verbal cues. Noted mild UE tremors- baseline per pt. Pt also endorsed coughing when drinking liquids at home for a long time. Cues given for follow through w/ tasks.   On [place] O2 2L; afebrile. WBC elevated but trending down.   OF NOTE: pt had a MBSS in 07/2023: Pt presents with a mild oropharyngeal dysphagia. Pt with mildly prolonged mastication of solids likely due to dental status. Pt with before the swallow Inconsistently, audible Aspiration with thin liquids which was reduced to before/during the swallow laryngeal penetration when a chin tuck was utilized. A cued cough with inconsitently effective in clearing penetration. Trace pharyngeal stasis appreciated. Pharyngeal deficits due to decreased base of tongue retration, reduced hyolaryngeal elevation/excursion, mistimed laryngeal vestibule closure, and reduced pharyngeal and laryngeal sensation.   Pt appears to present w/ oropharyngeal phase Dysphagia as evidenced by results of the BSE, this tx session, and his previous MBSS (07/2023) -- as well as pt report that he has coughing at home PTA. Suspect sensorimotor deficits (noted) which resulted in subtle and overt s/s of aspiration as well as delay in oral clearing. The overt s/s of aspiration (coughing) were noted w/ Nectar liquids using the chin tuck strategy(baseline for pt per MBSS); this necessitated further downgrade of diet to HONEY consistency liquids today, w/ the chin tuck strategy.  Pt appears at risk for aspiration/aspiration pneumonia in setting of  his oropharyngeal phase Dysphagia. The risk can be reduced somewhat by following aspiration precautions and using a modified diet consistency; 100% Supervision is indicated for monitoring oral intake and follow through w/ swallowing strategies/precautions. Pt has challenging factors that could impact oropharyngeal swallowing to include: baseline Dysphagia w/ h/o aspiration of thin liquids per MBSS 07/2023; Advanced Age; Deconditioning and bedbound at baseline; support at meals for sitting up and tray setup. These factors can increase risk for aspiration, dysphagia as well as decreased oral intake overall.    During po trials of Nectar liquids w/ chin tuck using Single, small sips Slowly, pt exhibited overt coughing w/ the trials. Pt fed self w/ setup and support. W/ trials of HONEY liquids USING A CHIN TUCK STRATEGY, no immediate/overt s/s of aspiration noted- no coughing, no decline in vocal quality, nor change in respiratory presentation during/post trials. Oral phase c/b grossly functional bolus management and oral clearing of the HONEY liquids. Control of bolus propulsion for A-P transfer for swallowing appeared functional.    Recommend a Dysphagia level 2 (minced consistency) diet w/ well-moistened foods; HONEY consistency liquids VIA CUP -- recommend using a Chin Tuck when swallowing HONEY liquids. Pt should always help Hold Cup when drinking. Recommend aspiration precautions, including reducing talking/distractions at meals and small/single bites moistened well and/or alternated w/ a puree. MUST sit fully upright for po intake. Pills CRUSHED in Puree for safer, easier swallowing -- recommended now and for D/C. Tray setup.  Education given on: swallowing and Dysphagia; risk for pneumonia(pt currently has); liquid consistency- HONEY; aspiration precautions and QOL/POC discussion w/ Family and MD- a Palliative Care consult could be helpful/supporting; Pills in Puree; food consistencies and easy to eat  options; aspiration precautions to pt and Son  present. NSG updated, agreed. MD updated. Recommend Dietician and Palliative Care f/u for support. Precautions posted in room, chart. MBSS TBD while admitted. ST services will f/u w/ pt's status and GOC/POC while admitted.      HPI HPI: Pt is a 89 y.o. male  with medical history significant of Dysphagia, PAF, history of stroke, COPD, chronic kidney disease stage IIIb, coronary artery disease, essential hypertension, obstructive sleep apnea, severe debility with bedbound status, who was brought to the hospital from [facility] facility because of a syncopal episode.  He is bedbound at baseline.  He was found to be hypoxic with oxygen saturation of 84%.  He was admitted to the hospital for COPD exacerbation and pneumonia complicated by acute on chronic hypoxic respiratory failure.    Chest CT: 1. Significantly progressive diffuse peribronchovascular nodularity  with more confluent airspace opacities dependently in both lower  lobes compared with previous CT of 11 months ago. Findings are most  consistent with progressive atypical infection, including fungal and  atypical mycobacterial infection. Differential includes organizing  pneumonia. Consider bronchoscopy.  2. Multiple enlarging, predominately right-sided solid pulmonary  nodules, likely part of the same process. However, given the size of  the nodules, malignancy not excluded.  3. New complex dependent small right greater than left pleural  effusions.  4. New subpleural air cyst anteriorly in the right upper lobe with  slightly thickened walls, but no significant solid components or  pneumothorax.  5. Stable central enlargement of the pulmonary arteries consistent  with pulmonary arterial hypertension.   OF NOTE: Pt has a Baseline of oropharyngeal phase dysphagia w/ aspiration of thin liquids in 07/2023 per MBSS.  Pt endorsed he is NOT using the Chin Tuck strategy recommended to lessen risk for  aspiration of thin liquids.      SLP Plan  Continue with current plan of care          Recommendations  Diet recommendations: Dysphagia 2 (fine chop);Honey-thick liquid Liquids provided via: Cup;No straw Medication Administration: Whole meds with puree Supervision: Patient able to self feed;Intermittent supervision to cue for compensatory strategies (setup and positioning support too) Compensations: Minimize environmental distractions;Slow rate;Small sips/bites;Lingual sweep for clearance of pocketing;Follow solids with liquid Postural Changes and/or Swallow Maneuvers: Out of bed for meals;Seated upright 90 degrees;Upright 30-60 min after meal;Chin tuck                 (Palliative Care consult for GOC and support; Dietician) Oral care BID;Oral care before and after PO;Patient independent with oral care;Staff/trained caregiver to provide oral care   Intermittent Supervision/Assistance Dysphagia, oropharyngeal phase (R13.12) (Baseline Dysphagia w/ h/o aspiration of thin liquids; Advanced Age; Deconditioning; support at meals for sitting up and tray setup)     Continue with current plan of care       Comer Portugal, MS, CCC-SLP Speech Language Pathologist Rehab Services; [facility] [phone] (ascom) Faven Watterson  07/19/2024, 3:33 PM

## 2024-07-19 NOTE — Evaluation (Signed)
 Occupational Therapy Evaluation Patient Details Name: Ryan Pace MRN: [id number] DOB: Sep 29, 1928 Today's Date: 07/19/2024   History of Present Illness   89 year old male patient with a past medical history of CVA complicated by recurrent aspiration events and dysphagia presenting to [facility] on 11/20 with 2 episodes of syncope.  Was found to be in acute hypoxic respiratory failure requiring 3 to 4 L nasal cannula.     Clinical Impressions Pt was seen for OT evaluation this date. Prior to hospital admission, pt was living at [facility], receiving therapy services making progress towards ambulating goals. Prior to pneumonia son/pt reports modi with transfers to and from [facility], brief standing without assistance. Pt presents with deficits in decreased Ind in self care, balance, functional mobility/transfers, activity tolerance, and safety awareness affecting safe and optimal ADL completion. Pt required consistent comforting due to anxiety during OOB activity, educated on pursed lip breathing. Pt HR fluctuating, ?pleth throughout, ranging from 189-100bpm (RN in room aware). Pt on 2.5L oxygen, spo2 levels >90% throughout. Pt currently requires MOD-MAXA for bed mobility, MAXA+2 for STS/transfers with use of RW, MODA for seated ADLs on the EOB. Pt would benefit from skilled OT services to address noted impairments and functional limitations (see below for any additional details) in order to maximize safety and independence while minimizing future risk of falls, injury, and readmission. OT will follow acutely.    If plan is discharge home, recommend the following:   A little help with walking and/or transfers;A little help with bathing/dressing/bathroom;Assistance with cooking/housework;Assist for transportation;Help with stairs or ramp for entrance     Functional Status Assessment   Patient has had a recent decline in their functional status and demonstrates the ability to make significant  improvements in function in a reasonable and predictable amount of time.     Equipment Recommendations   Other (comment) (Defer to next venue of care)     Recommendations for Other Services         Precautions/Restrictions   Precautions Precautions: Fall Recall of Precautions/Restrictions: Impaired Restrictions Weight Bearing Restrictions Per Provider Order: No     Mobility Bed Mobility Overal bed mobility: Needs Assistance Bed Mobility: Sit to Supine       Sit to supine: Mod assist, Max assist        Transfers Overall transfer level: Needs assistance Equipment used: Rolling walker (2 wheels) Transfers: Sit to/from Stand, Bed to chair/wheelchair/BSC Sit to Stand: +2 safety/equipment, Max assist, +2 physical assistance, From elevated surface     Step pivot transfers: Max assist, +2 safety/equipment, +2 physical assistance, From elevated surface     General transfer comment: Heavy multimodal cues for trasnfer technique and sequening as pt anxious throughout mobility (shaking and requires encouragement to participate).      Balance Overall balance assessment: Needs assistance Sitting-balance support: Feet supported, Single extremity supported Sitting balance-Leahy Scale: Fair Sitting balance - Comments: Steady static sitting on the EOB with CGA   Standing balance support: During functional activity, Reliant on assistive device for balance, Bilateral upper extremity supported Standing balance-Leahy Scale: Poor Standing balance comment: Requires MAXA+2 standing with bilateral UE support                           ADL either performed or assessed with clinical judgement   ADL Overall ADL's : Needs assistance/impaired Eating/Feeding: Set up;Sitting   Grooming: Wash/dry face;Wash/dry hands;Sitting;Moderate assistance       Lower Body Bathing: Maximal assistance;Sit to/from  stand;+2 for safety/equipment       Lower Body Dressing: Maximal  assistance;Sit to/from stand;+2 for safety/equipment   Toilet Transfer: Rolling walker (2 wheels);BSC/3in1;Maximal assistance;+2 for safety/equipment;+2 for physical assistance Toilet Transfer Details (indicate cue type and reason): Simulated toilet t/f Toileting- Clothing Manipulation and Hygiene: Maximal assistance;Sit to/from stand       Functional mobility during ADLs: Maximal assistance;+2 for safety/equipment;+2 for physical assistance;Rolling walker (2 wheels) General ADL Comments: MAXA+2 step pivot into recliner for simulated toilet t/f     Vision Baseline Vision/History: 1 Wears glasses                         Pertinent Vitals/Pain Pain Assessment Pain Assessment: No/denies pain     Extremity/Trunk Assessment Upper Extremity Assessment Upper Extremity Assessment: Generalized weakness   Lower Extremity Assessment Lower Extremity Assessment: Generalized weakness   Cervical / Trunk Assessment Cervical / Trunk Assessment: Kyphotic   Communication Communication Communication: No apparent difficulties Factors Affecting Communication: Hearing impaired   Cognition Arousal: Alert Behavior During Therapy: Anxious Cognition: No apparent impairments             OT - Cognition Comments: A/Ox4                 Following commands: Impaired Following commands impaired: Follows multi-step commands with increased time     Cueing  General Comments   Cueing Techniques: Verbal cues;Tactile cues;Gestural cues  RN in room to change skin barrier pad, pt on 2.5L via [place] >90% throughout   Exercises Exercises: Other exercises Other Exercises Other Exercises: Edu: Role of OT eval, safe ADL completion, beneifts of sitting upright, pursed lip breathing   Shoulder Instructions      Home Living Family/patient expects to be discharged to:: Skilled nursing facility                                 Additional Comments: From [facility] assisted [facility]       Prior Functioning/Environment Prior Level of Function : Needs assist  Cognitive Assist : Mobility (cognitive) Mobility (Cognitive): Step by step cues   Physical Assist : Mobility (physical) Mobility (physical): Bed mobility;Transfers;Gait;Stairs   Mobility Comments: modI with transfers to [facility] and can sometimes stand with assistance; son in room to confirm ADLs Comments: generally dependent for ADLs; assistance with bathing, dressing and showers    OT Problem List: Decreased strength;Decreased activity tolerance;Impaired balance (sitting and/or standing);Decreased coordination;Decreased safety awareness;Decreased knowledge of use of DME or AE;Decreased knowledge of precautions   OT Treatment/Interventions: Self-care/ADL training;Therapeutic exercise;Energy conservation;DME and/or AE instruction;Therapeutic activities;Patient/family education;Balance training      OT Goals(Current goals can be found in the care plan section)   Acute Rehab OT Goals Patient Stated Goal: Return to rehab OT Goal Formulation: With patient/family Time For Goal Achievement: 08/02/24 Potential to Achieve Goals: Good ADL Goals Pt Will Perform Grooming: sitting;with set-up Pt Will Perform Lower Body Dressing: sit to/from stand;with mod assist Pt Will Transfer to Toilet: ambulating;with mod assist Pt Will Perform Toileting - Clothing Manipulation and hygiene: sitting/lateral leans;with mod assist   OT Frequency:  Min 2X/week    Co-evaluation PT/OT/SLP Co-Evaluation/Treatment: Yes Reason for Co-Treatment: Complexity of the patient's impairments (multi-system involvement);Necessary to address cognition/behavior during functional activity;For patient/therapist safety;To address functional/ADL transfers PT goals addressed during session: Mobility/safety with mobility;Strengthening/ROM;Balance OT goals addressed during session: ADL's and self-care      AM-PAC OT  6 Clicks Daily Activity     Outcome  Measure Help from another person eating meals?: A Little Help from another person taking care of personal grooming?: A Little Help from another person toileting, which includes using toliet, bedpan, or urinal?: A Lot Help from another person bathing (including washing, rinsing, drying)?: A Lot Help from another person to put on and taking off regular upper body clothing?: A Little Help from another person to put on and taking off regular lower body clothing?: A Lot 6 Click Score: 15   End of Session Equipment Utilized During Treatment: Gait belt;Rolling walker (2 wheels);Oxygen Nurse Communication: Mobility status  Activity Tolerance: Patient tolerated treatment well Patient left: in chair;with call bell/phone within reach;with chair alarm set;with family/visitor present  OT Visit Diagnosis: Unsteadiness on feet (R26.81);Other abnormalities of gait and mobility (R26.89);Repeated falls (R29.6);Muscle weakness (generalized) (M62.81)                Time: 9058-8986 OT Time Calculation (min): 32 min Charges:  OT General Charges $OT Visit: 1 Visit OT Evaluation $OT Eval Moderate Complexity: 1 Mod  Hawk Mones M.S. OTR/L  07/19/24, 11:38 AM

## 2024-07-19 NOTE — Care Management Important Message (Signed)
 Important Message  Patient Details  Name: Ryan Pace MRN: [id number] Date of Birth: [birth year]   Important Message Given:  Yes - Medicare IM     Tobechukwu Emmick W, CMA 07/19/2024, 1:35 PM

## 2024-07-19 NOTE — Plan of Care (Signed)
  Problem: Clinical Measurements: Goal: Ability to maintain clinical measurements within normal limits will improve Outcome: Progressing   Problem: Pain Managment: Goal: General experience of comfort will improve and/or be controlled Outcome: Progressing   Problem: Elimination: Goal: Will not experience complications related to bowel motility Outcome: Progressing   Problem: Coping: Goal: Level of anxiety will decrease Outcome: Progressing   Problem: Skin Integrity: Goal: Risk for impaired skin integrity will decrease Outcome: Progressing   Problem: Safety: Goal: Ability to remain free from injury will improve Outcome: Progressing

## 2024-07-20 DIAGNOSIS — R55 Syncope and collapse: Secondary | ICD-10-CM | POA: Diagnosis not present

## 2024-07-20 LAB — CBC
HCT: 34.4 % — ABNORMAL LOW (ref 39.0–52.0)
Hemoglobin: 10.9 g/dL — ABNORMAL LOW (ref 13.0–17.0)
MCH: 28.7 pg (ref 26.0–34.0)
MCHC: 31.7 g/dL (ref 30.0–36.0)
MCV: 90.5 fL (ref 80.0–100.0)
Platelets: 214 K/uL (ref 150–400)
RBC: 3.8 MIL/uL — ABNORMAL LOW (ref 4.22–5.81)
RDW: 14.2 % (ref 11.5–15.5)
WBC: 11.5 K/uL — ABNORMAL HIGH (ref 4.0–10.5)
nRBC: 0 % (ref 0.0–0.2)

## 2024-07-20 LAB — BASIC METABOLIC PANEL WITH GFR
Anion gap: 7 (ref 5–15)
BUN: 35 mg/dL — ABNORMAL HIGH (ref 8–23)
CO2: 34 mmol/L — ABNORMAL HIGH (ref 22–32)
Calcium: 8.9 mg/dL (ref 8.9–10.3)
Chloride: 109 mmol/L (ref 98–111)
Creatinine, Ser: 1.34 mg/dL — ABNORMAL HIGH (ref 0.61–1.24)
GFR, Estimated: 49 mL/min — ABNORMAL LOW
Glucose, Bld: 87 mg/dL (ref 70–99)
Potassium: 4.1 mmol/L (ref 3.5–5.1)
Sodium: 150 mmol/L — ABNORMAL HIGH (ref 135–145)

## 2024-07-20 MED ORDER — DEXTROSE 5 % IV SOLN: INTRAVENOUS | Status: DC

## 2024-07-20 NOTE — Progress Notes (Signed)
 Occupational Therapy Treatment Patient Details Name: Ryan Pace MRN: [id number] DOB: 03-Sep-1928 Today's Date: 07/20/2024   History of present illness 89 year old male patient with a past medical history of CVA complicated by recurrent aspiration events and dysphagia presenting to [facility] on 11/20 with 2 episodes of syncope.  Was found to be in acute hypoxic respiratory failure requiring 3 to 4 L nasal cannula.   OT comments  Chart reviewed to date, pt greeted semi supine in bed, agreeable to OT tx session targeting improving functional activity tolerance in prep for ADL tasks. Pt does have noted memory deficits and increased time required for processing. Pt is making progress towards goals,with improvments noted in bed mobiilty attempts on this date requiring MIN-MOD A. MAX A required for oral care/washing face and LB dressing. MIN A-supervision for drinking thickened liquids with pt encouraged to chin tuck during drinking, one episode where a cough is noted after drinking. Nurse notified. Pt is left semi supine in bed,all needs met. OT will follow.      If plan is discharge home, recommend the following:  A little help with walking and/or transfers;A little help with bathing/dressing/bathroom;Assistance with cooking/housework;Assist for transportation;Help with stairs or ramp for entrance   Equipment Recommendations  Other (comment) (defer to next venue of care)    Recommendations for Other Services      Precautions / Restrictions Precautions Precautions: Fall Recall of Precautions/Restrictions: Impaired Restrictions Weight Bearing Restrictions Per Provider Order: No       Mobility Bed Mobility Overal bed mobility: Needs Assistance Bed Mobility: Supine to Sit, Sit to Supine     Supine to sit: Min assist, Mod assist, +2 for physical assistance, HOB elevated, Used rails Sit to supine: Min assist, +2 for physical assistance        Transfers Overall transfer level: Needs  assistance Equipment used: Rolling walker (2 wheels) Transfers: Sit to/from Stand Sit to Stand: Min assist, Mod assist, +2 physical assistance, From elevated surface                 Balance Overall balance assessment: Needs assistance Sitting-balance support: Feet supported Sitting balance-Leahy Scale: Fair     Standing balance support: During functional activity, Reliant on assistive device for balance Standing balance-Leahy Scale: Fair                             ADL either performed or assessed with clinical judgement   ADL Overall ADL's : Needs assistance/impaired Eating/Feeding: Supervision/ safety-MIN A;bed level, aspiration precautions    Grooming: Wash/dry face;Oral care;Maximal assistance               Lower Body Dressing: Maximal assistance;Sitting/lateral leans                      Extremity/Trunk Assessment              Vision       Perception     Praxis     Communication Communication Communication: Impaired Factors Affecting Communication: Hearing impaired   Cognition Arousal: Alert Behavior During Therapy: WFL for tasks assessed/performed, Anxious Cognition: No family/caregiver present to determine baseline, Cognition impaired       Memory impairment (select all impairments): Declarative long-term memory   Executive functioning impairment (select all impairments): Problem solving, Reasoning                   Following commands: Impaired Following commands impaired:  Follows one step commands with increased time      Cueing   Cueing Techniques: Verbal cues, Tactile cues  Exercises Other Exercises Other Exercises: edu re role of OT, role of rehab    Shoulder Instructions       General Comments      Pertinent Vitals/ Pain       Pain Assessment Pain Assessment: No/denies pain  Home Living                                          Prior Functioning/Environment               Frequency  Min 2X/week        Progress Toward Goals  OT Goals(current goals can now be found in the care plan section)  Progress towards OT goals: Progressing toward goals  Acute Rehab OT Goals Time For Goal Achievement: 08/02/24  Plan      Co-evaluation    PT/OT/SLP Co-Evaluation/Treatment: Yes Reason for Co-Treatment: Complexity of the patient's impairments (multi-system involvement);Necessary to address cognition/behavior during functional activity;For patient/therapist safety;To address functional/ADL transfers PT goals addressed during session: Mobility/safety with mobility;Strengthening/ROM OT goals addressed during session: ADL's and self-care      AM-PAC OT 6 Clicks Daily Activity     Outcome Measure   Help from another person eating meals?: A Little Help from another person taking care of personal grooming?: A Little Help from another person toileting, which includes using toliet, bedpan, or urinal?: A Lot Help from another person bathing (including washing, rinsing, drying)?: A Lot Help from another person to put on and taking off regular upper body clothing?: A Little Help from another person to put on and taking off regular lower body clothing?: A Lot 6 Click Score: 15    End of Session Equipment Utilized During Treatment: Gait belt;Rolling walker (2 wheels);Oxygen  OT Visit Diagnosis: Unsteadiness on feet (R26.81);Other abnormalities of gait and mobility (R26.89);Repeated falls (R29.6);Muscle weakness (generalized) (M62.81)   Activity Tolerance Patient tolerated treatment well   Patient Left in bed;with call bell/phone within reach;with bed alarm set   Nurse Communication Mobility status        Time: 8474-8451 OT Time Calculation (min): 23 min  Charges: OT General Charges $OT Visit: 1 Visit OT Treatments $Self Care/Home Management : 8-22 mins Therisa Sheffield, OTD OTR/L  07/20/24, 3:58 PM

## 2024-07-20 NOTE — Plan of Care (Signed)
 Vital signs reviewed  Will sign off at this time. No further recommendations at this time.  Recommend palliative care consultation  Nickolas Alm Cellar, M.D.  [facility]  Medical Director [facility] Medical Director [facility] Cardio-Pulmonary Department

## 2024-07-20 NOTE — Progress Notes (Signed)
 Speech Language Pathology Treatment: Dysphagia  Patient Details Name: Ryan Pace MRN: [id number] DOB: 11/14/1928 Today's Date: 07/20/2024 Time: 0950-1030 SLP Time Calculation (min) (ACUTE ONLY): 40 min  Assessment / Plan / Recommendation Clinical Impression  Pt seen for ongoing assessment of swallowing today. Pt was downgraded to HONEY consistency liquids yesterday s/p overt clinical s/s of aspiration when drinking Nectar liquids. Pt awake, verbally responded; required min verbal cues during follow through w/ tasks. Noted mild UE tremors- baseline per pt.  Pt also endorsed coughing when drinking liquids at home for a long time.    On [place] O2 4L; afebrile. WBC elevated but trending down.   OF NOTE: pt had a MBSS in 07/2023: Pt presents with a mild oropharyngeal dysphagia. Pt with mildly prolonged mastication of solids likely due to dental status. Pt with before the swallow Inconsistently, audible Aspiration with thin liquids which was reduced to before/during the swallow laryngeal penetration when a chin tuck was utilized. A cued cough with inconsitently effective in clearing penetration. Trace pharyngeal stasis appreciated. Pharyngeal deficits due to decreased base of tongue retration, reduced hyolaryngeal elevation/excursion, mistimed laryngeal vestibule closure, and reduced pharyngeal and laryngeal sensation.   Pt appears to present w/ oropharyngeal phase Dysphagia as evidenced by results of the BSE, tx sessions, and his previous MBSS (07/2023) -- as well as pt's own report that he has coughing at home PTA. Son endorsed the same at yesterday's tx session. Suspect sensorimotor deficits (noted) which resulted in subtle and overt s/s of aspiration as well as delay in oral clearing. The overt s/s of aspiration (coughing) were noted w/ Nectar liquids using the chin tuck strategy(baseline for pt per MBSS); this necessitated further downgrade of diet to HONEY consistency liquids today, w/ the  chin tuck strategy.  Pt appears at risk for aspiration/aspiration pneumonia in setting of his oropharyngeal phase Dysphagia. The risk can be reduced somewhat by following aspiration precautions, swallowing strategy of chin tuck(baseline), and using a modified diet consistency; 100% Supervision is indicated for monitoring oral intake and follow through w/ swallowing strategies/precautions. Pt has challenging factors that could impact oropharyngeal swallowing to include: baseline Dysphagia w/ h/o aspiration of thin liquids per MBSS 07/2023; Advanced Age; Deconditioning and bedbound at baseline; support at meals for sitting up and tray setup. These factors can increase risk for aspiration, dysphagia as well as decreased oral intake overall.    During po trials of HONEY liquids w/ chin tuck using Single, small sips Slowly, pt exhibited no immediate, overt coughing w/ the trials. Pt fed self w/ setup and support. No decline in vocal quality, nor change in respiratory presentation during/post trials. Mild throat clearing noted b/t trials 1x. W/ trials of mech soft foods(diet upgrade for pleasure also), oral phase c/b grossly functional bolus management, mastication, and oral clearing of the foods/residue. Control of bolus propulsion for A-P transfer for swallowing appeared functional.    Recommend a more Dysphagia level 3 (mech soft/cut consistency) diet w/ well-moistened foods; HONEY consistency liquids VIA CUP -- recommend using a Chin Tuck when swallowing HONEY liquids. Pt should always help Hold Cup when drinking. Recommend aspiration precautions, including reducing talking/distractions at meals and small/single bites moistened well and/or alternated w/ a puree. MUST sit fully upright for po intake. Pills WHOLE vs CRUSHED in Puree for safer, easier swallowing -- recommended now and for D/C. Tray setup.   Education given to pt on: swallowing and Dysphagia; risk for pneumonia(pt currently has); liquid  consistency- HONEY; aspiration precautions and QOL/POC discussion  w/ pt and MD- a Palliative Care has been placed in chart; Pills in Puree; food consistencies and easy to eat options; aspiration precautions to pt. Pt stated he wanted a Frosty and that he had NO appetite.   NSG updated, agreed. MD updated. Recommend Dietician and Palliative Care f/u for support. Precautions posted in room, chart. MBSS TBD while admitted. ST services will f/u w/ pt's status and GOC/POC while admitted.       HPI HPI: Pt is a 89 y.o. male  with medical history significant of Dysphagia, PAF, history of stroke, COPD, chronic kidney disease stage IIIb, coronary artery disease, essential hypertension, obstructive sleep apnea, severe debility with bedbound status, who was brought to the hospital from [place] long-term care facility because of a syncopal episode.  He is bedbound at baseline.  He was found to be hypoxic with oxygen saturation of 84%.  He was admitted to the hospital for COPD exacerbation and pneumonia complicated by acute on chronic hypoxic respiratory failure.    Chest CT: 1. Significantly progressive diffuse peribronchovascular nodularity  with more confluent airspace opacities dependently in both lower  lobes compared with previous CT of 11 months ago. Findings are most  consistent with progressive atypical infection, including fungal and  atypical mycobacterial infection. Differential includes organizing  pneumonia. Consider bronchoscopy.  2. Multiple enlarging, predominately right-sided solid pulmonary  nodules, likely part of the same process. However, given the size of  the nodules, malignancy not excluded.  3. New complex dependent small right greater than left pleural  effusions.  4. New subpleural air cyst anteriorly in the right upper lobe with  slightly thickened walls, but no significant solid components or  pneumothorax.  5. Stable central enlargement of the pulmonary arteries consistent  with  pulmonary arterial hypertension.   OF NOTE: Pt has a Baseline of oropharyngeal phase dysphagia w/ aspiration of thin liquids in 07/2023 per MBSS.  Pt endorsed he is NOT using the Chin Tuck strategy recommended to lessen risk for aspiration of thin liquids.      SLP Plan  Continue with current plan of care;New goals to be determined pending instrumental study (pending MD and Family GOC discussion and POC)          Recommendations  Diet recommendations: Dysphagia 3 (mechanical soft);Honey-thick liquid Liquids provided via: Cup;No straw Medication Administration: Whole meds with puree Supervision: Patient able to self feed;Intermittent supervision to cue for compensatory strategies (setup and positioning support) Compensations: Minimize environmental distractions;Slow rate;Small sips/bites;Lingual sweep for clearance of pocketing;Follow solids with liquid Postural Changes and/or Swallow Maneuvers: Out of bed for meals;Seated upright 90 degrees;Upright 30-60 min after meal;Chin tuck                 (Palliative Care f/u for GOC in setting of Chronic Dysphagia; Dietician f/u) Oral care BID;Oral care before and after PO;Patient independent with oral care;Staff/trained caregiver to provide oral care   Intermittent Supervision/Assistance Dysphagia, oropharyngeal phase (R13.12) (Baseline Dysphagia w/ h/o aspiration of thin liquids; Advanced Age; Deconditioning; support at meals for sitting up and tray setup)     Continue with current plan of care;New goals to be determined pending instrumental study (pending MD and Family GOC discussion and POC)       Comer Portugal, MS, CCC-SLP Speech Language Pathologist Rehab Services; [facility] [place] [phone] (ascom) Ledford Goodson  07/20/2024, 5:21 PM

## 2024-07-20 NOTE — Progress Notes (Signed)
 Physical Therapy Treatment Patient Details Name: Ryan Pace MRN: [id number] DOB: 1928/10/26 Today's Date: 07/20/2024   History of Present Illness 89 year old male patient with a past medical history of CVA complicated by recurrent aspiration events and dysphagia presenting to [facility] on 11/20 with 2 episodes of syncope.  Was found to be in acute hypoxic respiratory failure requiring 3 to 4 L nasal cannula.    PT Comments  Pt ready for session.  Co-tx with OT for improved outcomes. He is able to get to EOB with min a x 2 initially progressing to mod a x 2 upon fully transitioning to sitting.  He is generally steady in sitting but does lean forward on knees for support.  He is able to stand from elevated bed height to RW with min/mod a x 2 then is able to take several small sidesteps along EOB with min a x 2 limited by fatigue.  He is unable to tolerate further upright activity and asks to lay back down.  Assisted with min a x 2.  OT remains in room to finish session as pt would like to do some light ADL task.  Discharge recommendations remain appropriate.    If plan is discharge home, recommend the following: A lot of help with bathing/dressing/bathroom;Assist for transportation;Help with stairs or ramp for entrance;Two people to help with walking and/or transfers;Assistance with cooking/housework   Can travel by private vehicle        Equipment Recommendations  None recommended by PT    Recommendations for Other Services       Precautions / Restrictions Precautions Precautions: Fall Recall of Precautions/Restrictions: Impaired Restrictions Weight Bearing Restrictions Per Provider Order: No     Mobility  Bed Mobility Overal bed mobility: Needs Assistance Bed Mobility: Supine to Sit, Sit to Supine     Supine to sit: Mod assist, +2 for physical assistance, HOB elevated, Used rails, Min assist Sit to supine: Min assist, +2 for physical assistance     Patient Response:  Cooperative  Transfers Overall transfer level: Needs assistance Equipment used: Rolling walker (2 wheels)   Sit to Stand: Min assist, Mod assist, +2 physical assistance, From elevated surface                Ambulation/Gait Ambulation/Gait assistance: Min assist, +2 physical assistance Gait Distance (Feet): 3 Feet Assistive device: Rolling walker (2 wheels) Gait Pattern/deviations: Step-to pattern Gait velocity: dec     General Gait Details: sidesteps along EOB well but limited by fatigue   Stairs             Wheelchair Mobility     Tilt Bed Tilt Bed Patient Response: Cooperative  Modified Rankin (Stroke Patients Only)       Balance   Sitting-balance support: Feet supported Sitting balance-Leahy Scale: Fair Sitting balance - Comments: Steady static sitting on the EOB with CGA   Standing balance support: During functional activity, Reliant on assistive device for balance Standing balance-Leahy Scale: Fair Standing balance comment: +2 for general safety                            Communication Communication Communication: Impaired Factors Affecting Communication: Hearing impaired  Cognition Arousal: Alert Behavior During Therapy: WFL for tasks assessed/performed, Anxious   PT - Cognitive impairments: No apparent impairments                       PT - Cognition Comments:  intact and orientated Following commands: Intact Following commands impaired: Follows one step commands with increased time    Cueing Cueing Techniques: Verbal cues, Tactile cues  Exercises      General Comments        Pertinent Vitals/Pain Pain Assessment Pain Assessment: No/denies pain    Home Living                          Prior Function            PT Goals (current goals can now be found in the care plan section) Progress towards PT goals: Progressing toward goals    Frequency    Min 2X/week      PT Plan       Co-evaluation PT/OT/SLP Co-Evaluation/Treatment: Yes Reason for Co-Treatment: Complexity of the patient's impairments (multi-system involvement);Necessary to address cognition/behavior during functional activity;For patient/therapist safety;To address functional/ADL transfers PT goals addressed during session: Mobility/safety with mobility;Strengthening/ROM OT goals addressed during session: ADL's and self-care      AM-PAC PT 6 Clicks Mobility   Outcome Measure  Help needed turning from your back to your side while in a flat bed without using bedrails?: A Little Help needed moving from lying on your back to sitting on the side of a flat bed without using bedrails?: A Lot Help needed moving to and from a bed to a chair (including a wheelchair)?: A Lot Help needed standing up from a chair using your arms (e.g., wheelchair or bedside chair)?: A Lot Help needed to walk in hospital room?: A Lot Help needed climbing 3-5 steps with a railing? : Total 6 Click Score: 12    End of Session Equipment Utilized During Treatment: Gait belt Activity Tolerance: Patient tolerated treatment well;Patient limited by fatigue Patient left: in bed;with call bell/phone within reach;with bed alarm set Nurse Communication: Mobility status PT Visit Diagnosis: Other abnormalities of gait and mobility (R26.89);Muscle weakness (generalized) (M62.81);Difficulty in walking, not elsewhere classified (R26.2)     Time: 8473-8462 PT Time Calculation (min) (ACUTE ONLY): 11 min  Charges:    $Therapeutic Activity: 8-22 mins PT General Charges $$ ACUTE PT VISIT: 1 Visit                   Lauraine Gills, PTA 07/20/24, 3:46 PM

## 2024-07-20 NOTE — Progress Notes (Addendum)
 Per the H&P the patient is from LTC at [facility]. TOC placed call to [facility] and spoke with Landry, they don't have a resident by that name. The patient presented to [facility] following syncopal episode. He was also noted to desat to 84% on 4 LNC, he is on 4 L at baseline. He is being treated with IV abx and duo nebs for COPD/PNA. TOC spoke with Suzen Oman at [facility] and the patient is from SNF there. They will accept him back when he is medically ready. They will need an updated FL2 prior to 1:30 PM on the day of discharge. TOC will continue to follow for dc disposition.

## 2024-07-20 NOTE — Progress Notes (Addendum)
 Progress Note    Ryan Pace  FMW:[id number] DOB: [birth year]  DOA: 07/16/2024 PCP: Lenon Layman ORN, MD      Brief Narrative:    Medical records reviewed and are as summarized below:  Ryan Pace is a 89 y.o. male  with medical history significant of PAF, history of stroke, COPD, chronic kidney disease stage IIIb, coronary artery disease, essential hypertension, obstructive sleep apnea, severe debility, who was brought to the hospital from [facility]-term [facility] facility because of a syncopal episode.  He is bedbound at baseline.  Apparently, he had 2 syncopal episodes prior to admission.  He was found to be hypoxic with oxygen saturation of 84%.  He was admitted to the hospital for COPD exacerbation and pneumonia complicated by acute on chronic hypoxic respiratory failure.  CT chest with contrast IMPRESSION: 1. Significantly progressive diffuse peribronchovascular nodularity with more confluent airspace opacities dependently in both lower lobes compared with previous CT of 11 months ago. Findings are most consistent with progressive atypical infection, including fungal and atypical mycobacterial infection. Differential includes organizing pneumonia. Consider bronchoscopy. 2. Multiple enlarging, predominately right-sided solid pulmonary nodules, likely part of the same process. However, given the size of the nodules, malignancy not excluded. 3. New complex dependent small right greater than left pleural effusions. 4. New subpleural air cyst anteriorly in the right upper lobe with slightly thickened walls, but no significant solid components or pneumothorax. 5. Stable central enlargement of the pulmonary arteries consistent with pulmonary arterial hypertension. 6.  Aortic Atherosclerosis (ICD10-I70.0).   Assessment/Plan:   Principal Problem:   Syncope and collapse Active Problems:   Acute renal failure superimposed on stage 3b chronic kidney disease  (HCC)   Paroxysmal A-fib (HCC)   Essential hypertension   CAD (coronary artery disease), native coronary artery   Acute hypoxemic respiratory failure (HCC)   COPD with acute exacerbation (HCC)   COPD exacerbation (HCC)    Body mass index is 30.05 kg/m.   S/p syncope: Probably due to hypoxia. 2D echo has been canceled since patient is contemplating hospice. Sinus tachycardia: Heart rate is okay at rest but usually goes up with standing.  He has not worked with PT again.  Orthostatics could not be obtained because he could not tolerate standing   COPD exacerbation: Continue antibiotics and bronchodilators.  Plan to complete 5 days of prednisone  tomorrow   Community-acquired pneumonia, atypical infection: Leukocytosis improving.  Significantly progressive diffuse peribronchovascular nodularity with more confluent airspace opacities in both lower lobes on CT chest. Appreciate recommendations from pulmonologist. Continue azithromycin  and IV ceftriaxone . Plan for repeat CT chest imaging in 4 to 8 weeks as an outpatient if desired..   Chronic hypoxic respiratory failure: He has been using 4 L of oxygen for since he was diagnosed with pneumonia about 3 to 4 weeks prior to admission.  According to [name], son, he was not using oxygen prior to this.   AKI on CKD stage IIIb: Creatinine is better.   Hypernatremia: Sodium up from 145-150.  Patient is not eating much.  Start IV 5% dextrose  infusion.  Monitor BMP.   Dysphagia: Speech therapist changed diet to dysphagia 3 diet with honey thick liquids.   Unfortunately, he is at risk for recurrent aspiration pneumonia because of his significant dysphagia.     Paroxysmal atrial fibrillation, history of stroke: Continue Eliquis    Chronic diastolic CHF: Compensated 2D echo in August 2024 showed EF estimated at 60 to 65%, grade 1 diastolic dysfunction.  Follow-up with hospice team and palliative care team.  Patient and family want to  discuss the way forward regarding goals of care possibly transitioning to hospice   Diet Order             DIET DYS 3 Room service appropriate? Yes with Assist; Fluid consistency: Honey Thick  Diet effective now                                  Consultants: Pulmonologist  Procedures: None    Medications:    apixaban   2.5 mg Oral BID   feeding supplement (NEPRO CARB STEADY)  237 mL Oral BID BM   ipratropium-albuterol   3 mL Nebulization BID   predniSONE   40 mg Oral Q breakfast   sertraline   12.5 mg Oral QHS   sertraline   50 mg Oral Daily   traZODone   200 mg Oral QHS   Continuous Infusions:  cefTRIAXone  (ROCEPHIN )  IV 2 g (07/19/24 1630)   dextrose  75 mL/hr at 07/20/24 0931      Anti-infectives (From admission, onward)    Start     Dose/Rate Route Frequency Ordered Stop   07/18/24 1615  cefTRIAXone  (ROCEPHIN ) 2 g in sodium chloride  0.9 % 100 mL IVPB        2 g 200 mL/hr over 30 Minutes Intravenous Every 24 hours 07/18/24 1528     07/17/24 1000  azithromycin  (ZITHROMAX ) 500 mg in sodium chloride  0.9 % 250 mL IVPB        500 mg 250 mL/hr over 60 Minutes Intravenous Daily 07/17/24 0759 07/18/24 1145   07/16/24 1115  cefTRIAXone  (ROCEPHIN ) 2 g in sodium chloride  0.9 % 100 mL IVPB        2 g 200 mL/hr over 30 Minutes Intravenous  Once 07/16/24 1113 07/16/24 1149   07/16/24 1115  azithromycin  (ZITHROMAX ) 500 mg in sodium chloride  0.9 % 250 mL IVPB        500 mg 250 mL/hr over 60 Minutes Intravenous  Once 07/16/24 1113 07/16/24 1318              Family Communication/Anticipated D/C date and plan/Code Status   DVT prophylaxis: apixaban  (ELIQUIS ) tablet 2.5 mg Start: 07/16/24 2200 SCDs Start: 07/16/24 1224 apixaban  (ELIQUIS ) tablet 2.5 mg     Code Status: Limited: Do not attempt resuscitation (DNR) -DNR-LIMITED -Do Not Intubate/DNI   Family Communication: None Disposition Plan: Plan to discharge to long-term care facility   Status is:  Inpatient Remains inpatient appropriate because: COPD exacerbation, pneumonia       Subjective:   Interval events noted.  He has no complaints.  Joesph, RN, was at the bedside.  Objective:    Vitals:   07/19/24 1944 07/20/24 0411 07/20/24 0812 07/20/24 0816  BP: 134/85 (!) 144/77  (!) 135/99  Pulse: (!) 108 75  90  Resp: 18 15    Temp: (!) 97.5 F (36.4 C) 98.1 F (36.7 C)  98 F (36.7 C)  TempSrc: Oral Oral  Oral  SpO2: 98% 99% 98% 93%  Height:       No data found.    Intake/Output Summary (Last 24 hours) at 07/20/2024 1428 Last data filed at 07/19/2024 1900 Gross per 24 hour  Intake 360 ml  Output --  Net 360 ml   There were no vitals filed for this visit.  Exam:  GEN: NAD SKIN: Warm and dry EYES: Anicteric ENT: MMM CV:  RRR PULM: Bilateral rales and bilateral rhonchi ABD: soft, ND, NT, +BS CNS: AAO x 3, non focal EXT: No edema or tenderness       Data Reviewed:   I have personally reviewed following labs and imaging studies:  Labs: Labs show the following:   Basic Metabolic Panel: Recent Labs  Lab 07/16/24 1041 07/17/24 0348 07/18/24 0605 07/20/24 0531  NA 142 145 145 150*  K 4.1 4.5 3.8 4.1  CL 97* 101 104 109  CO2 33* 33* 33* 34*  GLUCOSE 107* 125* 77 87  BUN 44* 47* 43* 35*  CREATININE 2.04* 2.04* 1.60* 1.34*  CALCIUM  9.7 9.1 8.9 8.9   GFR CrCl cannot be calculated (Unknown ideal weight.). Liver Function Tests: Recent Labs  Lab 07/16/24 1041  AST 64*  ALT 44  ALKPHOS 214*  BILITOT 0.5  PROT 8.1  ALBUMIN 3.2*   No results for input(s): LIPASE, AMYLASE in the last 168 hours. No results for input(s): AMMONIA in the last 168 hours. Coagulation profile Recent Labs  Lab 07/16/24 1041  INR 1.3*    CBC: Recent Labs  Lab 07/16/24 1041 07/18/24 0605 07/19/24 0409 07/20/24 0531  WBC 17.4* 15.4* 14.6* 11.5*  NEUTROABS 15.3*  --   --   --   HGB 12.8* 11.1* 11.8* 10.9*  HCT 40.8 34.1* 37.1* 34.4*  MCV 89.7  87.7 89.0 90.5  PLT 223 200 239 214   Cardiac Enzymes: No results for input(s): CKTOTAL, CKMB, CKMBINDEX, TROPONINI in the last 168 hours. BNP (last 3 results) Recent Labs    07/16/24 1041  PROBNP 1,863.0*   CBG: Recent Labs  Lab 07/18/24 0845  GLUCAP 84   D-Dimer: No results for input(s): DDIMER in the last 72 hours. Hgb A1c: No results for input(s): HGBA1C in the last 72 hours. Lipid Profile: No results for input(s): CHOL, HDL, LDLCALC, TRIG, CHOLHDL, LDLDIRECT in the last 72 hours. Thyroid  function studies: No results for input(s): TSH, T4TOTAL, T3FREE, THYROIDAB in the last 72 hours.  Invalid input(s): FREET3 Anemia work up: No results for input(s): VITAMINB12, FOLATE, FERRITIN, TIBC, IRON, RETICCTPCT in the last 72 hours. Sepsis Labs: Recent Labs  Lab 07/16/24 1041 07/16/24 1216 07/16/24 1932 07/18/24 0605 07/19/24 0409 07/20/24 0531  PROCALCITON  --   --  0.25  --   --   --   WBC 17.4*  --   --  15.4* 14.6* 11.5*  LATICACIDVEN 4.2* 3.4*  --  1.2  --   --     Microbiology Recent Results (from the past 240 hours)  Blood Culture (routine x 2)     Status: None (Preliminary result)   Collection Time: 07/16/24 10:41 AM   Specimen: BLOOD  Result Value Ref Range Status   Specimen Description BLOOD RIGHT ANTECUBITAL  Final   Special Requests   Final    BOTTLES DRAWN AEROBIC AND ANAEROBIC Blood Culture adequate volume   Culture   Final    NO GROWTH 4 DAYS Performed at [facility], [place], [address] [place], [place] [address]    Report Status PENDING  Incomplete  Blood Culture (routine x 2)     Status: None (Preliminary result)   Collection Time: 07/16/24 11:00 AM   Specimen: BLOOD  Result Value Ref Range Status   Specimen Description BLOOD LEFT ANTECUBITAL  Final   Special Requests   Final    BOTTLES DRAWN AEROBIC AND ANAEROBIC Blood Culture results may not be optimal due to an inadequate volume of blood  received in culture bottles   Culture   Final    NO GROWTH 4 DAYS Performed at [facility], [address] [place], [place] [address]    Report Status PENDING  Incomplete  MRSA Next Gen by PCR, Nasal     Status: Abnormal   Collection Time: 07/17/24  8:43 AM   Specimen: Nasal Mucosa; Nasal Swab  Result Value Ref Range Status   MRSA by PCR Next Gen DETECTED (A) NOT DETECTED Final    Comment: RESULT CALLED TO, READ BACK BY AND VERIFIED WITH: ODILIA FLOWERS, RN @2028  07/17/2024 COP (NOTE) The GeneXpert MRSA Assay (FDA approved for NASAL specimens only), is one component of a comprehensive MRSA colonization surveillance program. It is not intended to diagnose MRSA infection nor to guide or monitor treatment for MRSA infections. Test performance is not FDA approved in patients less than 20 years old. Performed at [facility], [address] [place], [place] [address]     Procedures and diagnostic studies:  No results found.              LOS: 4 days   Cavin Longman  [facility]   Pager on [email or web address]. If 7PM-7AM, please contact night-coverage at [email or web address]     07/20/2024, 2:28 PM

## 2024-07-20 NOTE — Plan of Care (Signed)

## 2024-07-21 DIAGNOSIS — R55 Syncope and collapse: Secondary | ICD-10-CM | POA: Diagnosis not present

## 2024-07-21 LAB — CULTURE, BLOOD (ROUTINE X 2)
Culture: NO GROWTH
Culture: NO GROWTH
Special Requests: ADEQUATE

## 2024-07-21 LAB — CBC
HCT: 37.9 % — ABNORMAL LOW (ref 39.0–52.0)
Hemoglobin: 11.8 g/dL — ABNORMAL LOW (ref 13.0–17.0)
MCH: 28.2 pg (ref 26.0–34.0)
MCHC: 31.1 g/dL (ref 30.0–36.0)
MCV: 90.7 fL (ref 80.0–100.0)
Platelets: 244 10*3/uL (ref 150–400)
RBC: 4.18 MIL/uL — ABNORMAL LOW (ref 4.22–5.81)
RDW: 14.5 % (ref 11.5–15.5)
WBC: 12.3 10*3/uL — ABNORMAL HIGH (ref 4.0–10.5)
nRBC: 0 % (ref 0.0–0.2)

## 2024-07-21 LAB — BASIC METABOLIC PANEL WITH GFR
Anion gap: 9 (ref 5–15)
BUN: 24 mg/dL — ABNORMAL HIGH (ref 8–23)
CO2: 32 mmol/L (ref 22–32)
Calcium: 8.8 mg/dL — ABNORMAL LOW (ref 8.9–10.3)
Chloride: 102 mmol/L (ref 98–111)
Creatinine, Ser: 1.14 mg/dL (ref 0.61–1.24)
GFR, Estimated: 60 mL/min — ABNORMAL LOW
Glucose, Bld: 91 mg/dL (ref 70–99)
Potassium: 3.5 mmol/L (ref 3.5–5.1)
Sodium: 143 mmol/L (ref 135–145)

## 2024-07-21 MED ORDER — ONDANSETRON HCL 4 MG/2ML IJ SOLN: 4.0000 mg | Freq: Four times a day (QID) | INTRAMUSCULAR | Status: DC | PRN

## 2024-07-21 MED ORDER — HYDROMORPHONE HCL 1 MG/ML IJ SOLN: 0.2500 mg | INTRAMUSCULAR | Status: DC | PRN

## 2024-07-21 MED ORDER — OXYCODONE HCL 20 MG/ML PO CONC
5.0000 mg | Freq: Four times a day (QID) | ORAL | Status: DC
  Administered 2024-07-21 – 2024-07-22 (×3): 5 mg via ORAL
  Filled 2024-07-21 (×3): qty 0.5

## 2024-07-21 MED ORDER — HALOPERIDOL LACTATE 2 MG/ML PO CONC: 0.5000 mg | ORAL | Status: DC | PRN

## 2024-07-21 MED ORDER — LORAZEPAM 2 MG/ML IJ SOLN: 1.0000 mg | INTRAMUSCULAR | Status: DC | PRN

## 2024-07-21 MED ORDER — GLYCOPYRROLATE 0.2 MG/ML IJ SOLN: 0.2000 mg | INTRAMUSCULAR | Status: DC | PRN

## 2024-07-21 MED ORDER — GLYCOPYRROLATE 0.2 MG/ML IJ SOLN
0.2000 mg | INTRAMUSCULAR | Status: DC | PRN
Start: 2024-07-21 — End: 2024-07-22

## 2024-07-21 MED ORDER — ONDANSETRON 4 MG PO TBDP: 4.0000 mg | ORAL_TABLET | Freq: Four times a day (QID) | ORAL | Status: DC | PRN

## 2024-07-21 MED ORDER — HALOPERIDOL LACTATE 5 MG/ML IJ SOLN: 0.5000 mg | INTRAMUSCULAR | Status: DC | PRN

## 2024-07-21 MED ORDER — HALOPERIDOL 0.5 MG PO TABS: 0.5000 mg | ORAL_TABLET | ORAL | Status: DC | PRN

## 2024-07-21 MED ORDER — GLYCOPYRROLATE 1 MG PO TABS: 1.0000 mg | ORAL_TABLET | ORAL | Status: DC | PRN

## 2024-07-21 MED ORDER — MAGIC MOUTHWASH W/LIDOCAINE
5.0000 mL | Freq: Three times a day (TID) | ORAL | Status: DC | PRN
Start: 2024-07-21 — End: 2024-07-22
  Administered 2024-07-22: 5 mL via ORAL
  Filled 2024-07-21 (×2): qty 5

## 2024-07-21 MED ORDER — MIRTAZAPINE 15 MG PO TBDP
7.5000 mg | ORAL_TABLET | Freq: Every day | ORAL | Status: DC
  Administered 2024-07-21: 7.5 mg via ORAL
  Filled 2024-07-21: qty 0.5

## 2024-07-21 MED ORDER — POLYVINYL ALCOHOL 1.4 % OP SOLN: 1.0000 [drp] | Freq: Four times a day (QID) | OPHTHALMIC | Status: DC | PRN

## 2024-07-21 MED ORDER — LORAZEPAM 1 MG PO TABS: 1.0000 mg | ORAL_TABLET | ORAL | Status: DC | PRN

## 2024-07-21 MED ORDER — LORAZEPAM 2 MG/ML PO CONC: 1.0000 mg | ORAL | Status: DC | PRN

## 2024-07-21 NOTE — Consult Note (Signed)
 Consultation Note Date: 07/21/2024   Patient Name: Ryan Pace  DOB: 07/31/1929  MRN: [id number]  Age / Sex: 89 y.o., male  PCP: Lenon Layman ORN, MD Referring Physician: Jens Durand, MD  Reason for Consultation: assess for hospice  HPI/Patient Profile: 89 y.o. male  with past medical history of PAF, stroke, CKD 3b, CAD, HTN, obstructive sleep apnea, severe debility admitted on 07/16/2024 after a syncopal episode at his nursing facility- [facility]. Admitted for treatment of pneumonia and COPD exacerbation. Workup revealed chronic aspiration.  Palliative consulted for discussion of hospice.   Primary Decision Maker PATIENT  Discussion: Chart reviewed including labs, progress notes, imaging from this and previous encounters.  SLP note reviewed. Patient with significant dysphagia and aspiration- placed on nectar thick liquids.  Labs reviewed- WBC elevated- 12.3 today- was 17.4 on admission.  CT scan reviewed- diffuse airspace disease.  Case discusse dwith speech therapist and attending MD.  Met at bedside with patient and son.  Introduced Palliative medicine. Palliative medicine is specialized medical care for people living with serious illness. It focuses on providing relief from the symptoms and stress of a serious illness. The goal is to improve quality of life for both the patient and the family. We discussed patient's current acute issues as well as his chronic co-morbidities.  Patient immediately shared that he was tired of living and was ready to die. He was adamant that he never, ever wants to return to the hospital.  We discussed options for continuing medical interventions with goal of treating what is treatable, vs transition to full comfort measures only.  Discussed transition to comfort measures only which includes stopping IV fluids, antibiotics, labs and providing symptom management  for SOB, anxiety, nausea, vomiting, and other symptoms of dying.  Patient was clear that he wants comfort measures only. His son was supportive of his decision.  He has ongoing shortness of breath that keeps him awake at night. He does not sleep. He has not appetite. He does not enjoy the thickened liquids at all and he just wants a cold coke.  I discussed symptom management utilizing oxycodone  solution for shortness of breath, mirtazipine for sleep and appetite- patient is in agreement with this. He shared that sometimes it is difficult for him to get nurses to bring him medication when he asks for it. I recommended scheduling the oxycodone  and he can decline it if he feels he doesn't need it- he agrees with this plan.   We discussed utilizing hospice services at [facility] to ensure he has comfortable end of life. Hospice services and philosophy of care were discussed. Patient and son both agreed they would like patient to return to [facility] with hospice.   SUMMARY OF RECOMMENDATIONS -Aspiration pnumonia, chronic dysphagia and aspiration- patient would like to transition to comfort measures only. He wishes to stop antibiotics and focus on symptom management.  -Start oxycodone  concentrated liquid 5mg  q 6 hr- ok to hold if patient does not feel like he needs it -start hydromorphone  0.25mg  IV q2hr prn  for severe SOB -Start mirtazipine 7.5mg  at bedtime for sleep and appetitie -Lorazepam  1mg  IV or po for anxiety or sleep - TOC order placed for referral to hospice -Allow thin liquids per patient request for comfort and pleasure    Code Status/Advance Care Planning:   Code Status: Do not attempt resuscitation (DNR) - Comfort care    Prognosis:   Unable to determine  Discharge Planning: Skilled Nursing Facility with Hospice  Primary Diagnoses: Present on Admission:  Syncope and collapse  CAD (coronary artery disease), native coronary artery  Essential hypertension  Paroxysmal A-fib (HCC)   Acute renal failure superimposed on stage 3b chronic kidney disease (HCC)  COPD exacerbation (HCC)   Review of Systems  Constitutional:  Positive for appetite change.  Respiratory:  Positive for shortness of breath.   Psychiatric/Behavioral:  Positive for sleep disturbance.     Physical Exam Vitals and nursing note reviewed.  Constitutional:      General: He is not in acute distress.    Appearance: He is ill-appearing.  Cardiovascular:     Rate and Rhythm: Normal rate.  Pulmonary:     Comments: Increased rate Skin:    General: Skin is warm and dry.  Neurological:     Mental Status: He is oriented to person, place, and time.     Vital Signs: BP 114/72 (BP Location: Left Arm)   Pulse 95   Temp (!) 97.5 F (36.4 C) (Oral)   Resp 16   Ht 5' 9 (1.753 m)   SpO2 96%   BMI 30.05 kg/m  Pain Scale: 0-10   Pain Score: 0-No pain   SpO2: SpO2: 96 % O2 Device:SpO2: 96 % O2 Flow Rate: .O2 Flow Rate (L/min): 4 L/min  IO: Intake/output summary:  Intake/Output Summary (Last 24 hours) at 07/21/2024 1355 Last data filed at 07/21/2024 0900 Gross per 24 hour  Intake 1223.18 ml  Output --  Net 1223.18 ml    LBM: Last BM Date : 07/18/24 Baseline Weight:   Most recent weight:          Signed by: Cassondra Stain, AGNP-C Palliative Medicine  Time includes:   Preparing to see the patient (e.g., review of tests) Obtaining and/or reviewing separately obtained history Performing a medically necessary appropriate examination and/or evaluation Counseling and educating the patient/family/caregiver Ordering medications, tests, or procedures Referring and communicating with other health care professionals (when not reported separately) Documenting clinical information in the electronic or other health record Independently interpreting results (not reported separately) and communicating results to the patient/family/caregiver Care coordination (not reported separately) Clinical  documentation   Please contact Palliative Medicine Team phone at [phone] for questions and concerns.  For individual provider: See Tracey

## 2024-07-21 NOTE — Progress Notes (Signed)
 Progress Note    Ryan Pace  FMW:[id number] DOB: January 30, 1929  DOA: 07/16/2024 PCP: Lenon Layman ORN, MD      Brief Narrative:    Medical records reviewed and are as summarized below:  Ryan Pace is a 89 y.o. male  with medical history significant of PAF, history of stroke, COPD, chronic kidney disease stage IIIb, coronary artery disease, essential hypertension, obstructive sleep apnea, severe debility, who was [facility] because of a syncopal episode.  He is bedbound at baseline.  Apparently, he had 2 syncopal episodes prior to admission.  He was found to be hypoxic with oxygen saturation of 84%.  He was admitted to the hospital for COPD exacerbation and pneumonia complicated by acute on chronic hypoxic respiratory failure.  CT chest with contrast IMPRESSION: 1. Significantly progressive diffuse peribronchovascular nodularity with more confluent airspace opacities dependently in both lower lobes compared with previous CT of 11 months ago. Findings are most consistent with progressive atypical infection, including fungal and atypical mycobacterial infection. Differential includes organizing pneumonia. Consider bronchoscopy. 2. Multiple enlarging, predominately right-sided solid pulmonary nodules, likely part of the same process. However, given the size of the nodules, malignancy not excluded. 3. New complex dependent small right greater than left pleural effusions. 4. New subpleural air cyst anteriorly in the right upper lobe with slightly thickened walls, but no significant solid components or pneumothorax. 5. Stable central enlargement of the pulmonary arteries consistent with pulmonary arterial hypertension. 6.  Aortic Atherosclerosis (ICD10-I70.0).   Assessment/Plan:   Principal Problem:   Syncope and collapse Active Problems:   Acute renal failure superimposed on stage 3b chronic kidney disease  (HCC)   Paroxysmal A-fib (HCC)   Essential hypertension   CAD (coronary artery disease), native coronary artery   Acute hypoxemic respiratory failure (HCC)   COPD with acute exacerbation (HCC)   COPD exacerbation (HCC)    Body mass index is 30.05 kg/m.   S/p syncope: Probably due to hypoxia. 2D echo has been canceled since patient is contemplating hospice. Sinus tachycardia: Heart rate is okay at rest but usually goes up with standing.   Orthostatics could not be obtained because he could not tolerate standing   COPD exacerbation: Bronchodilators as needed. Completed 5 days of prednisone .   Community-acquired pneumonia, atypical infection: Leukocytosis improving.  Significantly progressive diffuse peribronchovascular nodularity with more confluent airspace opacities in both lower lobes on CT chest. Appreciate recommendations from pulmonologist. Patient has elected to proceed with comfort care.  Antibiotics have been discontinued.   Chronic hypoxic respiratory failure: He has been using 4 L of oxygen for since he was diagnosed with pneumonia about 3 to 4 weeks prior to admission.  According to [name], son, he was not using oxygen prior to this.   AKI on CKD stage IIIb: Creatinine is better.   Hypernatremia: Improved.   Discontinue IV fluids.   Dysphagia: Speech therapist changed diet to dysphagia 3 diet with honey thick liquids.   Unfortunately, he is at risk for recurrent aspiration pneumonia because of his significant dysphagia.     Paroxysmal atrial fibrillation, history of stroke: Continue Eliquis    Chronic diastolic CHF: Compensated 2D echo in August 2024 showed EF estimated at 60 to 65%, grade 1 diastolic dysfunction.   Patient was evaluated by palliative care team and hospice team today.  He has decided to enroll in hospice care.  Plan to discharge to [facility] long-term care facility tomorrow on hospice.  Diet Order             DIET DYS 3 Room service  appropriate? Yes with Assist; Fluid consistency: Thin  Diet effective now                                  Consultants: Pulmonologist Palliative care and hospice   Procedures: None    Medications:    apixaban   2.5 mg Oral BID   feeding supplement (NEPRO CARB STEADY)  237 mL Oral BID BM   ipratropium-albuterol   3 mL Nebulization BID   mirtazapine   7.5 mg Oral QHS   oxyCODONE   5 mg Oral Q6H   predniSONE   40 mg Oral Q breakfast   sertraline   12.5 mg Oral QHS   sertraline   50 mg Oral Daily   traZODone   200 mg Oral QHS   Continuous Infusions:      Anti-infectives (From admission, onward)    Start     Dose/Rate Route Frequency Ordered Stop   07/18/24 1615  cefTRIAXone  (ROCEPHIN ) 2 g in sodium chloride  0.9 % 100 mL IVPB  Status:  Discontinued        2 g 200 mL/hr over 30 Minutes Intravenous Every 24 hours 07/18/24 1528 07/21/24 1344   07/17/24 1000  azithromycin  (ZITHROMAX ) 500 mg in sodium chloride  0.9 % 250 mL IVPB        500 mg 250 mL/hr over 60 Minutes Intravenous Daily 07/17/24 0759 07/18/24 1145   07/16/24 1115  cefTRIAXone  (ROCEPHIN ) 2 g in sodium chloride  0.9 % 100 mL IVPB        2 g 200 mL/hr over 30 Minutes Intravenous  Once 07/16/24 1113 07/16/24 1149   07/16/24 1115  azithromycin  (ZITHROMAX ) 500 mg in sodium chloride  0.9 % 250 mL IVPB        500 mg 250 mL/hr over 60 Minutes Intravenous  Once 07/16/24 1113 07/16/24 1318              Family Communication/Anticipated D/C date and plan/Code Status   DVT prophylaxis: apixaban  (ELIQUIS ) tablet 2.5 mg Start: 07/16/24 2200 SCDs Start: 07/16/24 1224 apixaban  (ELIQUIS ) tablet 2.5 mg     Code Status: Do not attempt resuscitation (DNR) - Comfort care  Family Communication: None Disposition Plan: Plan to discharge to long-term care facility   Status is: Inpatient Remains inpatient appropriate because: COPD exacerbation, pneumonia       Subjective:   Interval events noted.  No  acute issues reported.  He still has a cough.  No shortness of breath or chest pain.  Objective:    Vitals:   07/21/24 0506 07/21/24 0715 07/21/24 0855 07/21/24 1159  BP: (!) 152/95  118/75 114/72  Pulse: 83  (!) 101 95  Resp: 16  16 16   Temp: (!) 97.4 F (36.3 C)  (!) 97.1 F (36.2 C) (!) 97.5 F (36.4 C)  TempSrc: Oral   Oral  SpO2: 98% 97% 94% 96%  Height:       No data found.    Intake/Output Summary (Last 24 hours) at 07/21/2024 1652 Last data filed at 07/21/2024 1300 Gross per 24 hour  Intake 983.18 ml  Output --  Net 983.18 ml   There were no vitals filed for this visit.  Exam:  GEN: NAD SKIN: Warm and dry EYES: No pallor or icterus ENT: MMM CV: RRR PULM: Bibasilar rales ABD: soft, ND, NT, +BS CNS: AAO x 3,  non focal EXT: No edema or tenderness        Data Reviewed:   I have personally reviewed following labs and imaging studies:  Labs: Labs show the following:   Basic Metabolic Panel: Recent Labs  Lab 07/16/24 1041 07/17/24 0348 07/18/24 0605 07/20/24 0531 07/21/24 0922  NA 142 145 145 150* 143  K 4.1 4.5 3.8 4.1 3.5  CL 97* 101 104 109 102  CO2 33* 33* 33* 34* 32  GLUCOSE 107* 125* 77 87 91  BUN 44* 47* 43* 35* 24*  CREATININE 2.04* 2.04* 1.60* 1.34* 1.14  CALCIUM  9.7 9.1 8.9 8.9 8.8*   GFR CrCl cannot be calculated (Unknown ideal weight.). Liver Function Tests: Recent Labs  Lab 07/16/24 1041  AST 64*  ALT 44  ALKPHOS 214*  BILITOT 0.5  PROT 8.1  ALBUMIN 3.2*   No results for input(s): LIPASE, AMYLASE in the last 168 hours. No results for input(s): AMMONIA in the last 168 hours. Coagulation profile Recent Labs  Lab 07/16/24 1041  INR 1.3*    CBC: Recent Labs  Lab 07/16/24 1041 07/18/24 0605 07/19/24 0409 07/20/24 0531 07/21/24 0922  WBC 17.4* 15.4* 14.6* 11.5* 12.3*  NEUTROABS 15.3*  --   --   --   --   HGB 12.8* 11.1* 11.8* 10.9* 11.8*  HCT 40.8 34.1* 37.1* 34.4* 37.9*  MCV 89.7 87.7 89.0 90.5  90.7  PLT 223 200 239 214 244   Cardiac Enzymes: No results for input(s): CKTOTAL, CKMB, CKMBINDEX, TROPONINI in the last 168 hours. BNP (last 3 results) Recent Labs    07/16/24 1041  PROBNP 1,863.0*   CBG: Recent Labs  Lab 07/18/24 0845  GLUCAP 84   D-Dimer: No results for input(s): DDIMER in the last 72 hours. Hgb A1c: No results for input(s): HGBA1C in the last 72 hours. Lipid Profile: No results for input(s): CHOL, HDL, LDLCALC, TRIG, CHOLHDL, LDLDIRECT in the last 72 hours. Thyroid  function studies: No results for input(s): TSH, T4TOTAL, T3FREE, THYROIDAB in the last 72 hours.  Invalid input(s): FREET3 Anemia work up: No results for input(s): VITAMINB12, FOLATE, FERRITIN, TIBC, IRON, RETICCTPCT in the last 72 hours. Sepsis Labs: Recent Labs  Lab 07/16/24 1041 07/16/24 1216 07/16/24 1932 07/18/24 0605 07/19/24 0409 07/20/24 0531 07/21/24 0922  PROCALCITON  --   --  0.25  --   --   --   --   WBC 17.4*  --   --  15.4* 14.6* 11.5* 12.3*  LATICACIDVEN 4.2* 3.4*  --  1.2  --   --   --     Microbiology Recent Results (from the past 240 hours)  Blood Culture (routine x 2)     Status: None   Collection Time: 07/16/24 10:41 AM   Specimen: BLOOD  Result Value Ref Range Status   Specimen Description BLOOD RIGHT ANTECUBITAL  Final   Special Requests   Final    BOTTLES DRAWN AEROBIC AND ANAEROBIC Blood Culture adequate volume   Culture   Final    NO GROWTH 5 DAYS Performed at [name], [address] [place], [place] [address]    Report Status 07/21/2024 FINAL  Final  Blood Culture (routine x 2)     Status: None   Collection Time: 07/16/24 11:00 AM   Specimen: BLOOD  Result Value Ref Range Status   Specimen Description BLOOD LEFT ANTECUBITAL  Final   Special Requests   Final    BOTTLES DRAWN AEROBIC AND ANAEROBIC Blood Culture results  may not be optimal due to an inadequate volume of blood received in  culture bottles   Culture   Final    NO GROWTH 5 DAYS Performed at [facility], [address] [place], [place] [address]    Report Status 07/21/2024 FINAL  Final  MRSA Next Gen by PCR, Nasal     Status: Abnormal   Collection Time: 07/17/24  8:43 AM   Specimen: Nasal Mucosa; Nasal Swab  Result Value Ref Range Status   MRSA by PCR Next Gen DETECTED (A) NOT DETECTED Final    Comment: RESULT CALLED TO, READ BACK BY AND VERIFIED WITH: OSASOGIE ODEH, RN @2028  07/17/2024 COP (NOTE) The GeneXpert MRSA Assay (FDA approved for NASAL specimens only), is one component of a comprehensive MRSA colonization surveillance program. It is not intended to diagnose MRSA infection nor to guide or monitor treatment for MRSA infections. Test performance is not FDA approved in patients less than 103 years old. Performed at [facility], [address] [place], [place] [address]     Procedures and diagnostic studies:  No results found.              LOS: 5 days   Aracelis Ulrey  [facility]   Pager on [email or web address]. If 7PM-7AM, please contact night-coverage at [email or web address]     07/21/2024, 4:52 PM

## 2024-07-21 NOTE — Plan of Care (Signed)
  Problem: Clinical Measurements: Goal: Will remain free from infection Outcome: Progressing   Problem: Coping: Goal: Level of anxiety will decrease Outcome: Progressing   Problem: Elimination: Goal: Will not experience complications related to urinary retention Outcome: Progressing

## 2024-07-21 NOTE — TOC Progression Note (Signed)
 Transition of Care [facility]) - Progression Note    Patient Details  Name: Ryan Pace MRN: [id number] Date of Birth: 21-Feb-1929  [facility]) CM/SW Contact  Dalia GORMAN Fuse, RN Phone Number: 07/21/2024, 2:52 PM  Clinical Narrative:    TOC spoke with the patient and son in his room. The patient would like to return to [facility] with Hospice following. TOC spoke with AC at [facility], and they will be out of the office Thur-Sun for the Thanksgiving holiday. He will have to admit to the facility by tomorrow afternoon or wait until Monday. TOC offered Hospice choice, and they choose [facility]. Referral sent to [facility] who accepted the referral. TOC will continue to follow.                      Expected Discharge Plan and Services                                               Social Drivers of Health (SDOH) Interventions SDOH Screenings   Food Insecurity: No Food Insecurity (07/17/2024)  Housing: Low Risk  (07/17/2024)  Transportation Needs: No Transportation Needs (07/17/2024)  Utilities: Not At Risk (07/17/2024)  Social Connections: Moderately Integrated (07/17/2024)  Tobacco Use: Medium Risk (07/16/2024)    Readmission Risk Interventions    07/29/2023    2:40 PM  Readmission Risk Prevention Plan  Transportation Screening Complete  Palliative Care Screening Complete  Medication Review (RN Care Manager) Complete

## 2024-07-21 NOTE — Progress Notes (Signed)
 [facility], Room 107 [facility] Liaison Note  Received request from Dalia Fuse, RN, Transitions of Care Manager, for hospice services at [facility] facility.  Spoke with patient  and son, [name], to initiate education related to hospice philosophy, services, and team approach to care.  Patient and son verbalized understanding of information given.  Per discussion, the plan is for discharge back to the [facility] today or tomorrow.   DME will be provided by the LTC facility.   Please send signed and completed DNR home with the patient/family.  Please provide prescriptions at discharge as needed to ensure ongoing symptom management.   [facility] information and contact numbers given to son.  Above information shared with Dalia Fuse, RN, Transitions of Care Manager.     Please call with any Hospice related questions or concerns.  Thank you for the opportunity to participate in this patient's care.  Marinell Nova, [facility] Liaison [phone]

## 2024-07-21 NOTE — Plan of Care (Signed)
  Problem: Education: Goal: Knowledge of General Education information will improve Description: Including pain rating scale, medication(s)/side effects and non-pharmacologic comfort measures Outcome: Progressing   Problem: Nutrition: Goal: Adequate nutrition will be maintained Outcome: Progressing   Problem: Coping: Goal: Level of anxiety will decrease Outcome: Progressing   Problem: Elimination: Goal: Will not experience complications related to bowel motility Outcome: Progressing   Problem: Pain Managment: Goal: General experience of comfort will improve and/or be controlled Outcome: Progressing   Problem: Safety: Goal: Ability to remain free from injury will improve Outcome: Progressing

## 2024-07-21 NOTE — Progress Notes (Signed)
 SLP F/u Note  Patient Details Name: Ryan Pace MRN: [id number] DOB: Jan 20, 1929   Cancelled treatment:       Reason Eval/Treat Not Completed:  (chart reviewed; d/w Palliative Care NP and NSG)   Per discussion and chart note, pt states now that he wants C Comfort Measures only. Family is supportive of this transition to Comfort Care status. He stated to the NP that he does not enjoy the thickened liquids at all and he just wants a cold coke.  Per Hospice note, the plan is for pt to discharge back to the [facility] today or tomorrow w/ Hospice services.  ST services will sign off at this time. MD to reconsult if new needs arise. NSG updated, agreed.       Comer Portugal, MS, CCC-SLP Speech Language Pathologist Rehab Services; [facility] [phone] (ascom) Helyn Schwan 07/21/2024, 4:43 PM

## 2024-07-22 DIAGNOSIS — C4491 Basal cell carcinoma of skin, unspecified: Secondary | ICD-10-CM

## 2024-07-22 DIAGNOSIS — N178 Other acute kidney failure: Secondary | ICD-10-CM | POA: Diagnosis not present

## 2024-07-22 DIAGNOSIS — J441 Chronic obstructive pulmonary disease with (acute) exacerbation: Secondary | ICD-10-CM | POA: Diagnosis not present

## 2024-07-22 DIAGNOSIS — R55 Syncope and collapse: Secondary | ICD-10-CM | POA: Diagnosis not present

## 2024-07-22 DIAGNOSIS — I10 Essential (primary) hypertension: Secondary | ICD-10-CM

## 2024-07-22 DIAGNOSIS — I480 Paroxysmal atrial fibrillation: Secondary | ICD-10-CM

## 2024-07-22 MED ORDER — MIRTAZAPINE 15 MG PO TBDP: 7.5000 mg | ORAL_TABLET | Freq: Every day | ORAL | Status: DC

## 2024-07-22 MED ORDER — NEPRO/CARBSTEADY PO LIQD: 237.0000 mL | Freq: Two times a day (BID) | ORAL | Status: DC

## 2024-07-22 MED ORDER — TRAMADOL HCL 50 MG PO TABS: 50.0000 mg | ORAL_TABLET | Freq: Four times a day (QID) | ORAL | 0 refills | Status: DC | PRN

## 2024-07-22 MED ORDER — ONDANSETRON 4 MG PO TBDP: 4.0000 mg | ORAL_TABLET | Freq: Four times a day (QID) | ORAL | Status: DC | PRN

## 2024-07-22 MED ORDER — TORSEMIDE 100 MG PO TABS: 50.0000 mg | ORAL_TABLET | Freq: Every day | ORAL | Status: DC

## 2024-07-22 MED ORDER — POLYVINYL ALCOHOL 1.4 % OP SOLN: 1.0000 [drp] | Freq: Four times a day (QID) | OPHTHALMIC | Status: DC | PRN

## 2024-07-22 MED ORDER — TORSEMIDE 100 MG PO TABS: 50.0000 mg | ORAL_TABLET | ORAL | Status: DC | PRN

## 2024-07-22 MED ORDER — MAGIC MOUTHWASH W/LIDOCAINE: 5.0000 mL | Freq: Three times a day (TID) | ORAL | Status: DC | PRN

## 2024-07-22 NOTE — TOC Transition Note (Addendum)
 Transition of Care [facility]) - Discharge Note   Patient Details  Name: Ryan Pace MRN: [id number] Date of Birth: Sep 06, 1928  Transition of Care [facility]) CM/SW Contact:  Dalia GORMAN Fuse, RN Phone Number: 07/22/2024, 9:40 AM   Clinical Narrative:    The patient is medically ready to discharge back to [facility] TOC spoke with Suzen Oman, the [facility], and she can accept the patient back today. She will text TOC with the number to call report. The patient will transport to the facility by [facility].    Final next level of care: Long Term Acute Care (LTAC) Barriers to Discharge: Barriers Resolved   Patient Goals and CMS Choice            Discharge Placement              Patient chooses bed at:  [facility]) Patient to be transferred to facility by: [facility] Name of family member notified: Kristene Bridegroom (Son)  [phone] Patient and family notified of of transfer: 07/22/24  Discharge Plan and Services Additional resources added to the After Visit Summary for                                       Social Drivers of Health (SDOH) Interventions SDOH Screenings   Food Insecurity: No Food Insecurity (07/17/2024)  Housing: Low Risk  (07/17/2024)  Transportation Needs: No Transportation Needs (07/17/2024)  Utilities: Not At Risk (07/17/2024)  Social Connections: Moderately Integrated (07/17/2024)  Tobacco Use: Medium Risk (07/16/2024)     Readmission Risk Interventions    07/29/2023    2:40 PM  Readmission Risk Prevention Plan  Transportation Screening Complete  Palliative Care Screening Complete  Medication Review (RN Care Manager) Complete

## 2024-07-22 NOTE — [place] FL2 (Signed)
 [place]  MEDICAID FL2 LEVEL OF CARE FORM     IDENTIFICATION  Patient Name: Ryan Pace Birthdate: Nov 24, 1928 Sex: male Admission Date (Current Location): 07/16/2024  [facility] and [place] Number:  Chiropodist and Address:  [facility], [address] [place], [place] [address]      Provider Number: [id number]  Attending Physician Name and Address:  Caleen Qualia, MD  Relative Name and Phone Number:  Kristene [name] Rhody)  [phone]    Current Level of Care: SNF Recommended Level of Care: Skilled Nursing Facility Prior Approval Number:    Date Approved/Denied:   PASRR Number: [id number]  Discharge Plan: SNF    Current Diagnoses: Patient Active Problem List   Diagnosis Date Noted   Syncope and collapse 07/16/2024   Acute hypoxemic respiratory failure (HCC) 07/16/2024   COPD with acute exacerbation (HCC) 07/16/2024   COPD exacerbation (HCC) 07/16/2024   BCC (basal cell carcinoma of skin) 12/12/2023   Bronchitis 07/29/2023   Chills 07/29/2023   Community acquired pneumonia of right middle lobe of lung 07/29/2023   Hypoxemia 07/29/2023   Subjective fever 07/29/2023   Multilobar lung infiltrate 07/28/2023   CKD stage 3a, GFR 45-59 ml/min (HCC) 07/28/2023   Embolic stroke involving left anterior cerebral artery (HCC) 04/11/2023   Acute renal failure superimposed on stage 3b chronic kidney disease (HCC) 04/11/2023   Overweight (BMI 25.0-29.9) 04/11/2023   Skin lesion 04/11/2023   Generalized weakness 04/09/2023   Orthostatic hypotension 04/09/2023   Cellulitis 04/09/2023   Bilateral lower extremity edema 04/09/2023   Obstructive sleep apnea 04/18/2020   S/P TKR (total knee replacement) using cement, left 10/29/2018   Essential hypertension 10/08/2018   Osteoarthritis of left knee 09/19/2018   Hematoma 06/08/2018   Seborrheic dermatitis 05/28/2018   Dupuytren's contracture 12/12/2015   Insomnia with sleep apnea 10/12/2015    Hyperlipidemia, mixed 03/09/2015   Moderate mitral insufficiency 09/06/2014   Paroxysmal A-fib (HCC) 09/06/2014   CAD (coronary artery disease), native coronary artery 02/05/2014    Orientation RESPIRATION BLADDER Height & Weight     Time, Situation, Place, Self  O2 (4 L ) Incontinent Weight:   Height:  5' 9 (175.3 cm)  BEHAVIORAL SYMPTOMS/MOOD NEUROLOGICAL BOWEL NUTRITION STATUS      Incontinent Diet (Dys 3)  AMBULATORY STATUS COMMUNICATION OF NEEDS Skin     Verbally                         Personal Care Assistance Level of Assistance  Bathing, Feeding, Dressing Bathing Assistance: Limited assistance Feeding assistance: Independent Dressing Assistance: Limited assistance     Functional Limitations Info             SPECIAL CARE FACTORS FREQUENCY  OT (By licensed OT)                    Contractures      Additional Factors Info  Code Status, Allergies Code Status Info: DNR Comfort Allergies Info: Penicillins           Current Medications (07/22/2024):  This is the current hospital active medication list Current Facility-Administered Medications  Medication Dose Route Frequency Provider Last Rate Last Admin   acetaminophen  (TYLENOL ) tablet 1,000 mg  1,000 mg Oral Q8H PRN Laurita Pillion, MD   1,000 mg at 07/19/24 2346   albuterol  (PROVENTIL ) (2.5 MG/3ML) 0.083% nebulizer solution 2.5 mg  2.5 mg Nebulization Q4H PRN Zhang, Dekui, MD   2.5 mg at  07/16/24 1754   apixaban  (ELIQUIS ) tablet 2.5 mg  2.5 mg Oral BID Laurita Pillion, MD   2.5 mg at 07/21/24 2104   artificial tears ophthalmic solution 1 drop  1 drop Both Eyes QID PRN Mahan, Kasie J, NP       feeding supplement (NEPRO CARB STEADY) liquid 237 mL  237 mL Oral BID BM Jens Durand, MD   237 mL at 07/21/24 1311   glycopyrrolate  (ROBINUL ) tablet 1 mg  1 mg Oral Q4H PRN Mahan, Kasie J, NP       Or   glycopyrrolate  (ROBINUL ) injection 0.2 mg  0.2 mg Subcutaneous Q4H PRN Mahan, Kasie J, NP       Or    glycopyrrolate  (ROBINUL ) injection 0.2 mg  0.2 mg Intravenous Q4H PRN Mahan, Kasie J, NP       haloperidol  (HALDOL ) tablet 0.5 mg  0.5 mg Oral Q4H PRN Mahan, Kasie J, NP       Or   haloperidol  (HALDOL ) 2 MG/ML solution 0.5 mg  0.5 mg Sublingual Q4H PRN Mahan, Kasie J, NP       Or   haloperidol  lactate (HALDOL ) injection 0.5 mg  0.5 mg Intravenous Q4H PRN Mahan, Kasie J, NP       HYDROmorphone  (DILAUDID ) injection 0.25 mg  0.25 mg Intravenous Q2H PRN Mahan, Kasie J, NP       ipratropium-albuterol  (DUONEB) 0.5-2.5 (3) MG/3ML nebulizer solution 3 mL  3 mL Nebulization BID Jens Durand, MD   3 mL at 07/22/24 0732   LORazepam  (ATIVAN ) tablet 1 mg  1 mg Oral Q4H PRN Mahan, Kasie J, NP       Or   LORazepam  (ATIVAN ) 2 MG/ML concentrated solution 1 mg  1 mg Sublingual Q4H PRN Mahan, Kasie J, NP       Or   LORazepam  (ATIVAN ) injection 1 mg  1 mg Intravenous Q4H PRN Mahan, Kasie J, NP       magic mouthwash w/lidocaine   5 mL Oral TID PRN Jens Durand, MD       mirtazapine  (REMERON  SOL-TAB) disintegrating tablet 7.5 mg  7.5 mg Oral QHS Mahan, Kasie J, NP   7.5 mg at 07/21/24 2104   ondansetron  (ZOFRAN -ODT) disintegrating tablet 4 mg  4 mg Oral Q6H PRN Mahan, Kasie J, NP       Or   ondansetron  (ZOFRAN ) injection 4 mg  4 mg Intravenous Q6H PRN Mahan, Kasie J, NP       oxyCODONE  (ROXICODONE  INTENSOL) 20 MG/ML concentrated solution 5 mg  5 mg Oral Q6H Mahan, Kasie J, NP   5 mg at 07/21/24 2058   sertraline  (ZOLOFT ) tablet 12.5 mg  12.5 mg Oral QHS Zhang, Dekui, MD   12.5 mg at 07/21/24 2106   sertraline  (ZOLOFT ) tablet 50 mg  50 mg Oral Daily Ayiku, Bernard, MD   50 mg at 07/21/24 9192   traMADol  (ULTRAM ) tablet 50 mg  50 mg Oral Q6H PRN Zhang, Dekui, MD   50 mg at 07/20/24 1626   traZODone  (DESYREL ) tablet 200 mg  200 mg Oral QHS Zhang, Dekui, MD   200 mg at 07/21/24 2100     Discharge Medications: Please see discharge summary for a list of discharge medications.  Relevant Imaging  Results:  Relevant Lab Results:   Additional Information SS [id number]  Dalia GORMAN Fuse, RN

## 2024-07-22 NOTE — Discharge Summary (Signed)
 Physician Discharge Summary   Patient: Ryan Pace MRN: [id number] DOB: 1929-08-01  Admit date:     07/16/2024  Discharge date: 07/22/24  Discharge Physician: Amaryllis Dare   PCP: Lenon Layman ORN, MD   Recommendations at discharge:  Patient is being discharged back to his facility with hospice  Discharge Diagnoses: Principal Problem:   Syncope and collapse Active Problems:   Acute renal failure superimposed on stage 3b chronic kidney disease (HCC)   Paroxysmal A-fib (HCC)   Essential hypertension   CAD (coronary artery disease), native coronary artery   Acute hypoxemic respiratory failure (HCC)   COPD with acute exacerbation (HCC)   COPD exacerbation [facility])   Hospital Course: Ryan Pace is a 89 y.o. male  with medical history significant of PAF, history of stroke, COPD, chronic kidney disease stage IIIb, coronary artery disease, essential hypertension, obstructive sleep apnea, severe debility, who was brought to the hospital from [facility] because of a syncopal episode.  He is bedbound at baseline.  Apparently, he had 2 syncopal episodes prior to admission.   He was found to be hypoxic with oxygen saturation of 84%.   He was admitted to the hospital for COPD exacerbation and pneumonia complicated by acute on chronic hypoxic respiratory failure.   CT chest with contrast IMPRESSION: 1. Significantly progressive diffuse peribronchovascular nodularity with more confluent airspace opacities dependently in both lower lobes compared with previous CT of 11 months ago. Findings are most consistent with progressive atypical infection, including fungal and atypical mycobacterial infection. Differential includes organizing pneumonia. Consider bronchoscopy. 2. Multiple enlarging, predominately right-sided solid pulmonary nodules, likely part of the same process. However, given the size of the nodules, malignancy not excluded. 3. New complex dependent  small right greater than left pleural effusions. 4. New subpleural air cyst anteriorly in the right upper lobe with slightly thickened walls, but no significant solid components or pneumothorax. 5. Stable central enlargement of the pulmonary arteries consistent with pulmonary arterial hypertension. 6.  Aortic Atherosclerosis (ICD10-I70.0).  Patient most likely has syncope secondary to hypoxia, echocardiogram was initially ordered and then canceled as patient and family would like to go back to his facility with hospice.  Patient is mostly bedbound and unable to tolerate standing.  He received 5 days of prednisone  for concern of COPD exacerbation and will continue with bronchodilators on discharge.  Patient did received antibiotics and pulmonary was consulted for concern of atypical pneumonia, progressive diffuse peribronchovascular nodularity with more confluent airspace opacities in both lower lobes on CT chest.  Leukocytosis improved.  Patient continued to need 3 to 4 L of oxygen which she will continue on discharge.  Concern of dysphagia, was evaluated by swallow team and recommended dysphagia 3 diet with honey thick liquids.  Patient will remain high risk for recurrent aspiration.  He will continue with comfort feed as he is being discharged back with hospice.  Patient has well compensated HFpEF with a EF of 60 to 65% with grade 1 diastolic dysfunction according to the echo done in August 2024.  Does not want to repeat echo at this time.  Patient is not requiring any significant pain medications or comfort care medications so they were not ordered.  Hospice will take over on discharge and will start comfort medications when needed.  Patient will continue on current medications and follow-up with his providers closely for further assistance.  Consultants: Palliative care.  Pulmonology Procedures performed: None Disposition: Hospice care Diet recommendation:  Discharge Diet Orders (From  admission, onward)     Start     Ordered   07/22/24 0000  Diet - low sodium heart healthy        07/22/24 1023           Dysphagia type 3 honey thick Liquid DISCHARGE MEDICATION: Allergies as of 07/22/2024       Reactions   Penicillins Rash   Tolerated ceftriaxone  in Jan and Aug 2024.  Also received cephalexin  in Aug 2024        Medication List     STOP taking these medications    azithromycin  250 MG tablet Commonly known as: ZITHROMAX    bisacodyl  10 MG suppository Commonly known as: DULCOLAX   loratadine  10 MG tablet Commonly known as: CLARITIN    magnesium  hydroxide 400 MG/5ML suspension Commonly known as: MILK OF MAGNESIA   predniSONE  20 MG tablet Commonly known as: DELTASONE        TAKE these medications    acetaminophen  500 MG tablet Commonly known as: TYLENOL  Take 2 tablets (1,000 mg total) by mouth every 8 (eight) hours as needed for mild pain (pain score 1-3).   apixaban  2.5 MG Tabs tablet Commonly known as: ELIQUIS  Take 2.5 mg by mouth 2 (two) times daily.   artificial tears ophthalmic solution Place 1 drop into both eyes 4 (four) times daily as needed for dry eyes.   atorvastatin  40 MG tablet Commonly known as: LIPITOR Take 40 mg by mouth daily.   cetirizine 10 MG tablet Commonly known as: ZYRTEC Take 10 mg by mouth daily.   cholecalciferol  1000 units tablet Commonly known as: VITAMIN D  Take 1,000 Units by mouth daily.   cyclobenzaprine 5 MG tablet Commonly known as: FLEXERIL Take 5 mg by mouth every 8 (eight) hours as needed for muscle spasms.   diclofenac  Sodium 1 % Gel Commonly known as: VOLTAREN  Apply 4 g topically 3 (three) times daily.   docusate sodium  100 MG capsule Commonly known as: COLACE Take 200 mg by mouth daily.   feeding supplement (NEPRO CARB STEADY) Liqd Take 237 mLs by mouth 2 (two) times daily between meals.   ferrous sulfate  325 (65 FE) MG EC tablet Take 325 mg by mouth every other day.   fluticasone  50 MCG/ACT nasal spray Commonly known as: FLONASE Place 2 sprays into both nostrils daily as needed.   gabapentin  100 MG capsule Commonly known as: NEURONTIN  Take 300 mg by mouth at bedtime.   guaiFENesin  100 MG/5ML liquid Commonly known as: ROBITUSSIN Take 10 mLs by mouth every 4 (four) hours as needed for cough or to loosen phlegm.   Lactulose  20 GM/30ML Soln Take 15 mLs (10 g total) by mouth daily as needed.   lidocaine  4 % Place 1 patch onto the skin daily. Apply to lower back   magic mouthwash w/lidocaine  Soln Take 5 mLs by mouth 3 (three) times daily as needed for mouth pain. Suspension contains equal amounts of Maalox Extra Strength, nystatin , diphenhydramine  and lidocaine .   metolazone 2.5 MG tablet Commonly known as: ZAROXOLYN Take 2.5 mg by mouth daily. 1 tab every 48 hours as needed for edema   mirtazapine  15 MG disintegrating tablet Commonly known as: REMERON  SOL-TAB Take 0.5 tablets (7.5 mg total) by mouth at bedtime.   nystatin  cream Commonly known as: MYCOSTATIN  Apply 1 Application topically 2 (two) times daily as needed (rash). (Apply to groin folds)   ondansetron  4 MG disintegrating tablet Commonly known as: ZOFRAN -ODT Take 1 tablet (4 mg total) by mouth every 6 (  six) hours as needed for nausea.   polyethylene glycol 17 g packet Commonly known as: MIRALAX  / GLYCOLAX  Take 17 g by mouth 2 (two) times daily as needed for moderate constipation or severe constipation.   potassium chloride 10 MEQ CR capsule Commonly known as: MICRO-K Take 10 mEq by mouth daily.   sertraline  50 MG tablet Commonly known as: ZOLOFT  Take 50 mg by mouth daily. What changed: Another medication with the same name was removed. Continue taking this medication, and follow the directions you see here.   Spiriva HandiHaler 18 MCG Caps Generic drug: Tiotropium Bromide Place 1 Inhalation into inhaler and inhale daily.   torsemide  100 MG tablet Commonly known as: DEMADEX  Take 0.5  tablets (50 mg total) by mouth daily. What changed: how much to take   traMADol  50 MG tablet Commonly known as: Ultram  Take 1 tablet (50 mg total) by mouth every 6 (six) hours as needed for up to 8 doses.   traZODone  100 MG tablet Commonly known as: DESYREL  Take 200 mg by mouth at bedtime.   verapamil  360 MG 24 hr capsule Commonly known as: VERELAN  Take 360 mg by mouth at bedtime.   zinc oxide 20 % ointment Apply 1 Application topically as needed for irritation.        Follow-up Information     Lenon Layman ORN, MD Follow up.   Specialty: Internal Medicine Why: hospital follow up Contact information: [address] [facility] [place] [address] [phone]                Discharge Exam: There were no vitals filed for this visit. General.  Frail elderly man, in no acute distress. Pulmonary.  Lungs clear bilaterally, normal respiratory effort. CV.  Regular rate and rhythm, no JVD, rub or murmur. Abdomen.  Soft, nontender, nondistended, BS positive. CNS.  Alert and oriented .  No focal neurologic deficit. Extremities.  No edema, no cyanosis, pulses intact and symmetrical.  Condition at discharge: stable  The results of significant diagnostics from this hospitalization (including imaging, microbiology, ancillary and laboratory) are listed below for reference.   Imaging Studies: CT Chest Wo Contrast Result Date: 07/16/2024 CLINICAL DATA:  Evaluate infiltrate/pneumonia. EXAM: CT CHEST WITHOUT CONTRAST TECHNIQUE: Multidetector CT imaging of the chest was performed following the standard protocol without IV contrast. RADIATION DOSE REDUCTION: This exam was performed according to the departmental dose-optimization program which includes automated exposure control, adjustment of the mA and/or kV according to patient size and/or use of iterative reconstruction technique. COMPARISON:  Radiographs 07/16/2024 and 07/28/2023. Chest CT 07/28/2023.  FINDINGS: Cardiovascular: Atherosclerosis of the aorta, great vessels and coronary arteries. There is stable central enlargement of the pulmonary arteries. The heart size is normal. There is no pericardial effusion. Mediastinum/Nodes: There are no enlarged mediastinal, hilar or axillary lymph nodes.Hilar assessment is limited by the lack of intravenous contrast, although the hilar contours appear unchanged. The thyroid  gland, trachea and esophagus demonstrate no significant findings. Lungs/Pleura: New complex dependent small right greater than left pleural effusions. No pneumothorax. Chronic but progressive diffuse central airway thickening and bronchiectasis with increased patchy ground-glass opacities at both lung apices. Significantly progressive diffuse peribronchovascular nodularity with more confluent airspace opacities dependently in both lower lobes. In addition, there are multiple enlarging, predominately right-sided solid pulmonary nodules, including a right middle lobe nodule measuring 1.8 cm on image 101/4 and right lower lobe nodules measuring 1.5 cm on image 86/4 and 4.9 x 2.0 cm on image 103/4.  There is a new subpleural air cyst anteriorly in the right upper lobe which measures up to 4.2 x 2.1 cm on image 66/4. This has slightly thickened walls, but no significant solid components. Upper abdomen: No acute findings are seen in the visualized upper abdomen. The gallbladder is mildly distended without wall thickening or surrounding inflammation. Nonobstructing right renal calculus and grossly stable bilateral renal cysts for which no specific follow-up imaging is recommended. The adrenal glands appear unchanged. Musculoskeletal/Chest wall: There is no chest wall mass or suspicious osseous finding. Minimal spondylosis for age. Healed posterior rib fractures on the right. IMPRESSION: 1. Significantly progressive diffuse peribronchovascular nodularity with more confluent airspace opacities dependently in  both lower lobes compared with previous CT of 11 months ago. Findings are most consistent with progressive atypical infection, including fungal and atypical mycobacterial infection. Differential includes organizing pneumonia. Consider bronchoscopy. 2. Multiple enlarging, predominately right-sided solid pulmonary nodules, likely part of the same process. However, given the size of the nodules, malignancy not excluded. 3. New complex dependent small right greater than left pleural effusions. 4. New subpleural air cyst anteriorly in the right upper lobe with slightly thickened walls, but no significant solid components or pneumothorax. 5. Stable central enlargement of the pulmonary arteries consistent with pulmonary arterial hypertension. 6.  Aortic Atherosclerosis (ICD10-I70.0). Electronically Signed   By: Elsie Perone M.D.   On: 07/16/2024 13:42   DG Chest Port 1 View Result Date: 07/16/2024 EXAM: 1 VIEW(S) XRAY OF THE CHEST 07/16/2024 11:11:45 AM COMPARISON: 07/28/2023 CLINICAL HISTORY: 89 year old male. Evaluation for pneumonia. History of COPD and acute cough. FINDINGS: LUNGS AND PLEURA: Mildly low lung volumes, stable. Chronic pulmonary interstitial opacities with bilateral subpleural scarring, as demonstrated on 2024 CT. No acute lung opacity. Stable Elevated right hemidiaphragm. No pleural effusion. No pneumothorax. HEART AND MEDIASTINUM: Aortic arch calcifications noted. No acute abnormality of the cardiac and mediastinal silhouettes. BONES AND SOFT TISSUES: No acute osseous abnormality. IMPRESSION: 1. No acute cardiopulmonary abnormality. Electronically signed by: Helayne Hurst MD 07/16/2024 11:39 AM EST RP Workstation: [id number]   CT HEAD WO CONTRAST ( ) Result Date: 07/16/2024 EXAM: CT HEAD WITHOUT CONTRAST 07/16/2024 11:31:45 AM TECHNIQUE: CT of the head was performed without the administration of intravenous contrast. Automated exposure control, iterative reconstruction, and/or weight based  adjustment of the mA/kV was utilized to reduce the radiation dose to as low as reasonably achievable. COMPARISON: Brain MRI 04/11/2023. Head CT 04/13/2023. CLINICAL HISTORY: 89 year old male. Evaluation for intracranial hemorrhage. History of CVA, reported syncope vs seizure at facility. FINDINGS: BRAIN AND VENTRICLES: No acute hemorrhage. No evidence of acute infarct. No hydrocephalus. No extra-axial collection. No mass effect or midline shift. Stable brain volume. Chronic periventricular white matter hypodensity, more pronounced in the left hemisphere and at the left corona radiata. Similar chronic asymmetry in the basal ganglia, greater on the left. Stable gray-white differentiation. Calcified atherosclerosis at the skull base. No suspicious intracranial vascular hyperdensity. ORBITS: No acute abnormality. SINUSES: Visible paranasal sinuses, middle ears, and mastoids remain well aerated. SOFT TISSUES AND SKULL: No acute soft tissue abnormality. No skull fracture. IMPRESSION: 1. No acute intracranial abnormality. 2. Stable non-contrast CT appearance of chronic small vessel disease, more pronounced in the left hemisphere. Electronically signed by: Helayne Hurst MD 07/16/2024 11:37 AM EST RP Workstation: [id number]    Microbiology: Results for orders placed or performed during the hospital encounter of 07/16/24  Blood Culture (routine x 2)     Status: None   Collection Time: 07/16/24 10:41 AM  Specimen: BLOOD  Result Value Ref Range Status   Specimen Description BLOOD RIGHT ANTECUBITAL  Final   Special Requests   Final    BOTTLES DRAWN AEROBIC AND ANAEROBIC Blood Culture adequate volume   Culture   Final    NO GROWTH 5 DAYS Performed at [facility], [address] [place], [place] [address]    Report Status 07/21/2024 FINAL  Final  Blood Culture (routine x 2)     Status: None   Collection Time: 07/16/24 11:00 AM   Specimen: BLOOD  Result Value Ref Range Status   Specimen Description  BLOOD LEFT ANTECUBITAL  Final   Special Requests   Final    BOTTLES DRAWN AEROBIC AND ANAEROBIC Blood Culture results may not be optimal due to an inadequate volume of blood received in culture bottles   Culture   Final    NO GROWTH 5 DAYS Performed at [facility], [address] [place], [place] [address]    Report Status 07/21/2024 FINAL  Final  MRSA Next Gen by PCR, Nasal     Status: Abnormal   Collection Time: 07/17/24  8:43 AM   Specimen: Nasal Mucosa; Nasal Swab  Result Value Ref Range Status   MRSA by PCR Next Gen DETECTED (A) NOT DETECTED Final    Comment: RESULT CALLED TO, READ BACK BY AND VERIFIED WITH: ODILIA FLOWERS, RN @2028  07/17/2024 COP (NOTE) The GeneXpert MRSA Assay (FDA approved for NASAL specimens only), is one component of a comprehensive MRSA colonization surveillance program. It is not intended to diagnose MRSA infection nor to guide or monitor treatment for MRSA infections. Test performance is not FDA approved in patients less than 55 years old. Performed at [facility], [address] [place], [place] [address]     Labs: CBC: Recent Labs  Lab 07/16/24 1041 07/18/24 0605 07/19/24 0409 07/20/24 0531 07/21/24 0922  WBC 17.4* 15.4* 14.6* 11.5* 12.3*  NEUTROABS 15.3*  --   --   --   --   HGB 12.8* 11.1* 11.8* 10.9* 11.8*  HCT 40.8 34.1* 37.1* 34.4* 37.9*  MCV 89.7 87.7 89.0 90.5 90.7  PLT 223 200 239 214 244   Basic Metabolic Panel: Recent Labs  Lab 07/16/24 1041 07/17/24 0348 07/18/24 0605 07/20/24 0531 07/21/24 0922  NA 142 145 145 150* 143  K 4.1 4.5 3.8 4.1 3.5  CL 97* 101 104 109 102  CO2 33* 33* 33* 34* 32  GLUCOSE 107* 125* 77 87 91  BUN 44* 47* 43* 35* 24*  CREATININE 2.04* 2.04* 1.60* 1.34* 1.14  CALCIUM  9.7 9.1 8.9 8.9 8.8*   Liver Function Tests: Recent Labs  Lab 07/16/24 1041  AST 64*  ALT 44  ALKPHOS 214*  BILITOT 0.5  PROT 8.1  ALBUMIN 3.2*   CBG: Recent Labs  Lab 07/18/24 0845  GLUCAP  84    Discharge time spent: greater than 30 minutes.  This record has been created using Conservation officer, historic buildings. Errors have been sought and corrected,but may not always be located. Such creation errors do not reflect on the standard of care.   Signed: Amaryllis Dare, MD [facility] 07/22/2024

## 2024-07-22 NOTE — Progress Notes (Addendum)
   07/22/24 1015  Spiritual Encounters  Type of Visit Initial  Care provided to: Patient  Conversation partners present during Programmer, Systems;Other (comment) (Physician)  Referral source Nurse (RN/NT/LPN)  Reason for visit Routine spiritual support  OnCall Visit Yes   Chaplain visited patient per the suggestion of staff.  Patient shared about the joy brought by the flowers in his room.  When Doctor came in, Chaplain shared that she's available if needed for additional support.   Rev. Rana M. Nicholaus, M.Div. Chaplain Resident [facility]

## 2024-07-22 NOTE — TOC Transition Note (Signed)
 [facility] was called today to give report. My call was transferred to [facility], message was left on voicemail to return my call. No call was returned. Called again, transferred to [facility] and left voicemail. I was unable to give report.

## 2024-07-27 DIAGNOSIS — J441 Chronic obstructive pulmonary disease with (acute) exacerbation: Secondary | ICD-10-CM | POA: Diagnosis not present

## 2024-07-28 DIAGNOSIS — I4891 Unspecified atrial fibrillation: Secondary | ICD-10-CM | POA: Diagnosis not present

## 2024-07-28 DIAGNOSIS — I679 Cerebrovascular disease, unspecified: Secondary | ICD-10-CM | POA: Diagnosis not present

## 2024-07-28 DIAGNOSIS — I2510 Atherosclerotic heart disease of native coronary artery without angina pectoris: Secondary | ICD-10-CM | POA: Diagnosis not present

## 2024-07-28 DIAGNOSIS — J449 Chronic obstructive pulmonary disease, unspecified: Secondary | ICD-10-CM | POA: Diagnosis not present

## 2024-07-28 DIAGNOSIS — I10 Essential (primary) hypertension: Secondary | ICD-10-CM | POA: Diagnosis not present

## 2024-07-28 DIAGNOSIS — E785 Hyperlipidemia, unspecified: Secondary | ICD-10-CM | POA: Diagnosis not present

## 2024-07-28 DIAGNOSIS — N1832 Chronic kidney disease, stage 3b: Secondary | ICD-10-CM | POA: Diagnosis not present
